# Patient Record
Sex: Male | Born: 1950
Health system: Southern US, Community
[De-identification: ages and names within clinical notes are randomized; demographics above are authoritative.]

## PROBLEM LIST (undated history)

## (undated) DIAGNOSIS — R519 Headache, unspecified: Secondary | ICD-10-CM

## (undated) DIAGNOSIS — B019 Varicella without complication: Secondary | ICD-10-CM

## (undated) DIAGNOSIS — I219 Acute myocardial infarction, unspecified: Secondary | ICD-10-CM

## (undated) DIAGNOSIS — I10 Essential (primary) hypertension: Secondary | ICD-10-CM

## (undated) DIAGNOSIS — R51 Headache: Secondary | ICD-10-CM

## (undated) DIAGNOSIS — R001 Bradycardia, unspecified: Secondary | ICD-10-CM

## (undated) DIAGNOSIS — I2510 Atherosclerotic heart disease of native coronary artery without angina pectoris: Secondary | ICD-10-CM

## (undated) DIAGNOSIS — E785 Hyperlipidemia, unspecified: Secondary | ICD-10-CM

## (undated) DIAGNOSIS — Z720 Tobacco use: Secondary | ICD-10-CM

## (undated) DIAGNOSIS — K219 Gastro-esophageal reflux disease without esophagitis: Secondary | ICD-10-CM

## (undated) DIAGNOSIS — M1990 Unspecified osteoarthritis, unspecified site: Secondary | ICD-10-CM

## (undated) DIAGNOSIS — E119 Type 2 diabetes mellitus without complications: Secondary | ICD-10-CM

## (undated) HISTORY — DX: Unspecified osteoarthritis, unspecified site: M19.90

## (undated) HISTORY — PX: POLYPECTOMY: SHX149

## (undated) HISTORY — PX: COLONOSCOPY: SHX174

## (undated) HISTORY — DX: Gastro-esophageal reflux disease without esophagitis: K21.9

## (undated) HISTORY — DX: Type 2 diabetes mellitus without complications: E11.9

## (undated) HISTORY — DX: Headache, unspecified: R51.9

## (undated) HISTORY — PX: SKIN CANCER EXCISION: SHX779

## (undated) HISTORY — DX: Headache: R51

## (undated) HISTORY — DX: Bradycardia, unspecified: R00.1

## (undated) HISTORY — DX: Varicella without complication: B01.9

---

## 2009-02-28 ENCOUNTER — Ambulatory Visit: Payer: Self-pay | Admitting: Family Medicine

## 2009-02-28 DIAGNOSIS — J069 Acute upper respiratory infection, unspecified: Secondary | ICD-10-CM | POA: Insufficient documentation

## 2009-09-11 ENCOUNTER — Ambulatory Visit: Payer: Self-pay | Admitting: Family Medicine

## 2009-09-11 DIAGNOSIS — R030 Elevated blood-pressure reading, without diagnosis of hypertension: Secondary | ICD-10-CM

## 2009-09-11 DIAGNOSIS — J029 Acute pharyngitis, unspecified: Secondary | ICD-10-CM

## 2009-09-24 ENCOUNTER — Ambulatory Visit: Payer: Self-pay | Admitting: Family Medicine

## 2010-03-17 NOTE — Assessment & Plan Note (Signed)
Summary: NEW PT/RCD   Vital Signs:  Patient profile:   60 year old male Height:      64.50 inches Weight:      156 pounds BMI:     26.46 Temp:     98.7 degrees F oral Pulse rate:   80 / minute Pulse rhythm:   regular Resp:     12 per minute BP sitting:   132 / 84  (left arm) Cuff size:   regular  Vitals Entered By: Sid Falcon LPN (February 28, 2009 3:39 PM) CC: New pt to establisn, sinus problems X 2-3 days   History of Present Illness: New patient to establish care. Seen with acute febrile illness which started few days ago. Fever up to 101 2 days ago but tapering down since then. Was seen at another practice and started on Zithromax for reported acute sinusitis. Reportedly had  negative flu screen. Symptoms include chills, dry cough, mild headaches, nasal congestion. No significant sore throat. No rashes.  Patient allergic to penicillin. Compliant with antibiotic therapy.  using hydrocodone cough medication at night for cough suppression.  Preventive Screening-Counseling & Management  Alcohol-Tobacco     Smoking Status: quit > 6 months     Year Quit: 1965  Allergies (verified): 1)  ! Penicillin V Potassium (Penicillin V Potassium) 2)  ! Prednisone (Prednisone)  Past History:  Family History: Last updated: 02/28/2009 Family History of Colon CA, father, brother Lung cancer, brother & sister Heart disease, brother Stroke, mother age 69  Social History: Last updated: 02/28/2009 Retired Married Alcohol use-no Past smoker  Risk Factors: Smoking Status: quit > 6 months (02/28/2009)  Family History: Family History of Colon CA, father, brother Lung cancer, brother & sister Heart disease, brother Stroke, mother age 14  Social History: Retired Married Alcohol use-no Past smoker Smoking Status:  quit > 6 months  Review of Systems      See HPI  Physical Exam  General:  Well-developed,well-nourished,in no acute distress; alert,appropriate and cooperative  throughout examination Ears:  External ear exam shows no significant lesions or deformities.  Otoscopic examination reveals clear canals, tympanic membranes are intact bilaterally without bulging, retraction, inflammation or discharge. Hearing is grossly normal bilaterally. Nose:  minimal clear rhinorrhea Mouth:  Oral mucosa and oropharynx without lesions or exudates.  Teeth in good repair. Neck:  No deformities, masses, or tenderness noted. Lungs:  Normal respiratory effort, chest expands symmetrically. Lungs are clear to auscultation, no crackles or wheezes. Heart:  Normal rate and regular rhythm. S1 and S2 normal without gallop, murmur, click, rub or other extra sounds.   Impression & Recommendations:  Problem # 1:  VIRAL URI (ICD-465.9) Finish out Z-max, though suspect this is all viral.  Cont cough med as needed .  Patient Instructions: 1)  Get plenty of rest, drink lots of clear liquids, and use Tylenol or Ibuprofen for fever and comfort. Return in 7-10 days if you're not better: sooner if you'er feeling worse.  2)  May take hydrocodone cough syrup 1 teaspoon every 4-6 hours as needed.

## 2010-03-17 NOTE — Assessment & Plan Note (Signed)
Summary: TROUBLE SWOLLING, CAN'T TALK//SLM   Vital Signs:  Patient profile:   60 year old male Weight:      158 pounds Temp:     100.8 degrees F oral BP sitting:   170 / 110  (left arm) Cuff size:   regular  Vitals Entered By: Sid Falcon LPN (September 11, 2009 8:33 AM)  Serial Vital Signs/Assessments:  Time      Position  BP       Pulse  Resp  Temp     By                     150/80                         Evelena Peat MD  CC: Walk-in, trouble swallowing, sore throat   History of Present Illness: Pt seen with 2 day hx of severe sore throat and fever. Gargoyles and Advil without much improvement.   Some yellowish postnasal drainage.  Mild HA. Is taking some ice chips.  No cough or dyspnea complaints.  Long hx of oral tobacco use.  No dysphagia prior to onset of sore throat.  Allergies: 1)  ! Penicillin V Potassium (Penicillin V Potassium) 2)  ! Prednisone (Prednisone)  Past History:  Family History: Last updated: 02/28/2009 Family History of Colon CA, father, brother Lung cancer, brother & sister Heart disease, brother Stroke, mother age 20  Social History: Last updated: 02/28/2009 Retired Married Alcohol use-no Past smoker  Risk Factors: Smoking Status: quit > 6 months (02/28/2009)  Review of Systems       The patient complains of fever.  The patient denies weight loss, chest pain, syncope, dyspnea on exertion, peripheral edema, prolonged cough, headaches, and abdominal pain.    Physical Exam  General:  Well-developed,well-nourished,in no acute distress; alert,appropriate and cooperative throughout examination Head:  Normocephalic and atraumatic without obvious abnormalities. No apparent alopecia or balding. Ears:  External ear exam shows no significant lesions or deformities.  Otoscopic examination reveals clear canals, tympanic membranes are intact bilaterally without bulging, retraction, inflammation or discharge. Hearing is grossly normal  bilaterally. Nose:  External nasal examination shows no deformity or inflammation. Nasal mucosa are pink and moist without lesions or exudates. Mouth:  patient has ulcer soft palate distally and which is about 1/2-1 cm. Diffuse posterior pharynx erythema without exudate. No evidence for peritonsillar abscess.  No evidence for any signif edema post pharynx. Neck:  No deformities, masses, or tenderness noted. Lungs:  Normal respiratory effort, chest expands symmetrically. Lungs are clear to auscultation, no crackles or wheezes. Heart:  Normal rate and regular rhythm. S1 and S2 normal without gallop, murmur, click, rub or other extra sounds. Skin:  no rash   Impression & Recommendations:  Problem # 1:  SORE THROAT (ICD-462)  rapid strep negative.   His updated medication list for this problem includes:    Azithromycin 250 Mg Tabs (Azithromycin) .Marland Kitchen... 2 by mouth today then one by mouth once daily for 4 days  Problem # 2:  ELEVATED BLOOD PRESSURE (ICD-796.2) Assessment: New follow up 2 weeks to reassess.  Complete Medication List: 1)  Azithromycin 250 Mg Tabs (Azithromycin) .... 2 by mouth today then one by mouth once daily for 4 days 2)  Hydrocodone-acetaminophen 5-325 Mg Tabs (Hydrocodone-acetaminophen) .Marland Kitchen.. 1-2 by mouth q 4-6 hours as needed severe pain  Patient Instructions: 1)  Followup promptly if you were unable to swallow fluids or sore  throat not improving over the next couple of days. Continue Aleve as needed for discomfort 2)  Please schedule a follow-up appointment in 2 weeks.  Prescriptions: HYDROCODONE-ACETAMINOPHEN 5-325 MG TABS (HYDROCODONE-ACETAMINOPHEN) 1-2 by mouth q 4-6 hours as needed severe pain  #30 x 0   Entered and Authorized by:   Evelena Peat MD   Signed by:   Evelena Peat MD on 09/11/2009   Method used:   Print then Give to Patient   RxID:   [id number] AZITHROMYCIN 250 MG TABS (AZITHROMYCIN) 2 by mouth today then one by mouth once daily for 4 days   #6 x 0   Entered and Authorized by:   Evelena Peat MD   Signed by:   Evelena Peat MD on 09/11/2009   Method used:   Print then Give to Patient   RxID:   [id number]

## 2010-03-17 NOTE — Assessment & Plan Note (Signed)
Summary: 2 WK ROV//SLM/PTS WIFE RSC/CJR   Vital Signs:  Patient profile:   60 year old male Weight:      154 pounds Temp:     98.4 degrees F oral Pulse rate:   80 / minute BP sitting:   120 / 80  (right arm) Cuff size:   regular  Vitals Entered By: Romualdo Bolk, CMA (AAMA) (September 24, 2009 1:26 PM) CC: Follow-up visit on bp   History of Present Illness: Patient recently seen for severe respiratory illness with sore throat. He had elevated blood pressure and we recommend follow up today to reassess. Never treated for hypertension. Wife has noted that when he takes Advil his blood pressure seems to go up. His sore throat has resolved. No further fever or chills.  Denies any headaches, dizziness, or chest pains. No alcohol use.  Preventive Screening-Counseling & Management  Alcohol-Tobacco     Alcohol drinks/day: 0     Smoking Status: quit > 6 months     Year Quit: 1965  Caffeine-Diet-Exercise     Caffeine use/day: 2     Does Patient Exercise: no  Current Medications (verified): 1)  Hydrocodone-Acetaminophen 5-325 Mg Tabs (Hydrocodone-Acetaminophen) .Marland Kitchen.. 1-2 By Mouth Q 4-6 Hours As Needed Severe Pain  Allergies (verified): 1)  ! Penicillin V Potassium (Penicillin V Potassium) 2)  ! Prednisone (Prednisone) PMH reviewed for relevance  Social History: Caffeine use/day:  2 Does Patient Exercise:  no  Physical Exam  General:  Well-developed,well-nourished,in no acute distress; alert,appropriate and cooperative throughout examination Mouth:  oropharynx now clear. No erythema or lesions Neck:  No deformities, masses, or tenderness noted. Lungs:  Normal respiratory effort, chest expands symmetrically. Lungs are clear to auscultation, no crackles or wheezes. Heart:  normal rate and regular rhythm.   Extremities:  no edema   Impression & Recommendations:  Problem # 1:  ELEVATED BLOOD PRESSURE (ICD-796.2) Assessment Improved possibly exacerbated by Advil.  Improved  reading today. Observe.  Problem # 2:  SORE THROAT (ICD-462) Assessment: Improved  The following medications were removed from the medication list:    Azithromycin 250 Mg Tabs (Azithromycin) .Marland Kitchen... 2 by mouth today then one by mouth once daily for 4 days  Complete Medication List: 1)  Hydrocodone-acetaminophen 5-325 Mg Tabs (Hydrocodone-acetaminophen) .Marland Kitchen.. 1-2 by mouth q 4-6 hours as needed severe pain  Patient Instructions: 1)  Check your  Blood Pressure regularly . If it is above:140/90   you should make an appointment.

## 2014-05-24 ENCOUNTER — Other Ambulatory Visit: Payer: Self-pay | Admitting: Family Medicine

## 2014-05-24 ENCOUNTER — Ambulatory Visit (INDEPENDENT_AMBULATORY_CARE_PROVIDER_SITE_OTHER): Payer: Medicare Other | Admitting: Family Medicine

## 2014-05-24 ENCOUNTER — Encounter: Payer: Self-pay | Admitting: Family Medicine

## 2014-05-24 VITALS — BP 126/90 | HR 70 | Temp 98.5°F | Ht 64.96 in | Wt 163.0 lb

## 2014-05-24 DIAGNOSIS — R300 Dysuria: Secondary | ICD-10-CM

## 2014-05-24 DIAGNOSIS — Z125 Encounter for screening for malignant neoplasm of prostate: Secondary | ICD-10-CM | POA: Diagnosis not present

## 2014-05-24 DIAGNOSIS — Z800 Family history of malignant neoplasm of digestive organs: Secondary | ICD-10-CM | POA: Diagnosis not present

## 2014-05-24 DIAGNOSIS — M545 Low back pain, unspecified: Secondary | ICD-10-CM

## 2014-05-24 DIAGNOSIS — Z23 Encounter for immunization: Secondary | ICD-10-CM

## 2014-05-24 DIAGNOSIS — R7989 Other specified abnormal findings of blood chemistry: Secondary | ICD-10-CM

## 2014-05-24 DIAGNOSIS — Z0000 Encounter for general adult medical examination without abnormal findings: Secondary | ICD-10-CM

## 2014-05-24 DIAGNOSIS — R229 Localized swelling, mass and lump, unspecified: Secondary | ICD-10-CM | POA: Diagnosis not present

## 2014-05-24 LAB — LIPID PANEL
CHOL/HDL RATIO: 8
CHOLESTEROL: 205 mg/dL — AB (ref 0–200)
HDL: 26.2 mg/dL — ABNORMAL LOW (ref >39.00)
NonHDL: 178.8
Triglycerides: 259 mg/dL — ABNORMAL HIGH (ref 0.0–149.0)
VLDL: 51.8 mg/dL — ABNORMAL HIGH (ref 0.0–40.0)

## 2014-05-24 LAB — CBC WITH DIFFERENTIAL/PLATELET
BASOS PCT: 0.4 % (ref 0.0–3.0)
Basophils Absolute: 0 10*3/uL (ref 0.0–0.1)
EOS PCT: 0.9 % (ref 0.0–5.0)
Eosinophils Absolute: 0.1 10*3/uL (ref 0.0–0.7)
HCT: 45.7 % (ref 39.0–52.0)
HEMOGLOBIN: 15.6 g/dL (ref 13.0–17.0)
LYMPHS PCT: 28.7 % (ref 12.0–46.0)
Lymphs Abs: 2.5 10*3/uL (ref 0.7–4.0)
MCHC: 34 g/dL (ref 30.0–36.0)
MCV: 90.4 fl (ref 78.0–100.0)
MONO ABS: 0.8 10*3/uL (ref 0.1–1.0)
MONOS PCT: 9 % (ref 3.0–12.0)
NEUTROS ABS: 5.3 10*3/uL (ref 1.4–7.7)
NEUTROS PCT: 61 % (ref 43.0–77.0)
Platelets: 281 10*3/uL (ref 150.0–400.0)
RBC: 5.06 Mil/uL (ref 4.22–5.81)
RDW: 13.6 % (ref 11.5–15.5)
WBC: 8.8 10*3/uL (ref 4.0–10.5)

## 2014-05-24 LAB — POCT URINALYSIS DIPSTICK
BILIRUBIN UA: NEGATIVE
Glucose, UA: NEGATIVE
Ketones, UA: NEGATIVE
Nitrite, UA: NEGATIVE
Spec Grav, UA: 1.015
UROBILINOGEN UA: 0.2
pH, UA: 5.5

## 2014-05-24 LAB — BASIC METABOLIC PANEL
BUN: 13 mg/dL (ref 6–23)
CO2: 26 mEq/L (ref 19–32)
CREATININE: 0.9 mg/dL (ref 0.40–1.50)
Calcium: 9.7 mg/dL (ref 8.4–10.5)
Chloride: 105 mEq/L (ref 96–112)
GFR: 89.69 mL/min (ref >60.00)
Glucose, Bld: 105 mg/dL — ABNORMAL HIGH (ref 70–99)
Potassium: 4.7 mEq/L (ref 3.5–5.1)
Sodium: 138 mEq/L (ref 135–145)

## 2014-05-24 LAB — HEPATIC FUNCTION PANEL
ALBUMIN: 4.4 g/dL (ref 3.5–5.2)
ALT: 16 U/L (ref 0–53)
AST: 17 U/L (ref 0–37)
Alkaline Phosphatase: 38 U/L — ABNORMAL LOW (ref 39–117)
BILIRUBIN TOTAL: 0.5 mg/dL (ref 0.2–1.2)
Bilirubin, Direct: 0.1 mg/dL (ref 0.0–0.3)
TOTAL PROTEIN: 7.9 g/dL (ref 6.0–8.3)

## 2014-05-24 LAB — LDL CHOLESTEROL, DIRECT: Direct LDL: 129 mg/dL

## 2014-05-24 LAB — TSH: TSH: 3.97 u[IU]/mL (ref 0.35–4.50)

## 2014-05-24 LAB — PSA: PSA: 2.56 ng/mL (ref 0.10–4.00)

## 2014-05-24 MED ORDER — TRAMADOL HCL 50 MG PO TABS: 50.0000 mg | ORAL_TABLET | Freq: Four times a day (QID) | ORAL | Status: DC | PRN

## 2014-05-24 NOTE — Progress Notes (Signed)
Pre visit review using our clinic review tool, if applicable. No additional management support is needed unless otherwise documented below in the visit note. 

## 2014-05-24 NOTE — Patient Instructions (Signed)
We will call you regarding screening colonoscopy We will call you with all labs were done today Schedule follow-up for right forearm skin lesion excision Try to reduce oral tobacco use to reduce risk of oral cancers Consider yearly flu vaccine and Pneumovax in 1 year

## 2014-05-24 NOTE — Progress Notes (Signed)
Subjective:    Patient ID: Terry Rivers, male    DOB: 1950-06-26, 64 y.o.   MRN: [id number]  HPI  Patient seen for Medicare Wellness visit (his first)- and for other separate issues below. He has not been seen here in several years nor by any other primary care physician. He does not take any regular medications. He does not smoke but does use oral tobacco. He had tetanus 2005. Has never had pneumonia vaccine. No history of colon cancer screening. Previous allergy to penicillin  Very strong family history of cancer. Father and brother had colon cancer. Sister died of uterine cancer. 2 brothers deceased with lung cancer. Father had prostate cancer. One brother died of renal cell cancer and another from bladder cancer. Mother died age 46 from stroke complications.  He does not take any medications. He is retired.  Reported history of positive PPD 1980. Previous chest x-rays normal. Denies any cough or shortness of breath.  Right arm nodular lesion which is nontender and asymptomatic. Present for several years. Possibly slowly enlarging  Occasional mild burning with urination.  No fever.   No obstructive symptoms.  No alleviating factors.  Low lumbar back pain for past few weeks.  Slightly worse right side.  No radiculopathy symptoms.  No LE weakness or numbness.  Worse with back flexion.  Mild to moderate pain.  Not relieved with OTC Tylenol.    Past Medical History  Diagnosis Date  . Arthritis   . Chicken pox   . Frequent headaches    History reviewed. No pertinent past surgical history.  reports that he has never smoked. His smokeless tobacco use includes Chew. He reports that he does not drink alcohol or use illicit drugs. family history includes Cancer in his brother, father, and mother; Stroke in his mother. Allergies  Allergen Reactions  . Penicillins     REACTION: joint swelling, breathing issues  . Prednisone     REACTION: joint swelling, could not walk   1.  Risk factors  based on Past Medical , Social, and Family history reviewed and as indicated above with no changes 2.  Limitations in physical activities None.  No recent falls. 3.  Depression/mood No active depression or anxiety issues 4.  Hearing No defiits 5.  ADLs independent in all. 6.  Cognitive function (orientation to time and place, language, writing, speech,memory) no short or long term memory issues.  Language and judgement intact. 7.  Home Safety no issues 8.  Height, weight, and visual acuity.all stable. 9.  Counseling discussed importance of cessation or oral tobacco. 10. Recommendation of preventive services. prevnar 13 and tetanus.  Yearly flu recommended and Pneumovax in one year. 11. Labs based on risk factors-lipid, PSA, CMP, CBC 12. Care Plan-as above. 13. Other Providers-none 14. Written schedule of screening/prevention services given to patient.    Review of Systems  Constitutional: Negative for fever, activity change, appetite change and fatigue.  HENT: Negative for congestion, ear pain and trouble swallowing.   Eyes: Negative for pain and visual disturbance.  Respiratory: Negative for cough, shortness of breath and wheezing.   Cardiovascular: Negative for chest pain and palpitations.  Gastrointestinal: Negative for nausea, vomiting, abdominal pain, diarrhea, constipation, blood in stool, abdominal distention and rectal pain.  Genitourinary: Positive for dysuria. Negative for hematuria and testicular pain.       Occasional mild burning with urination.  No fever.    Musculoskeletal: Positive for back pain. Negative for joint swelling and arthralgias.  Skin:  Negative for rash.  Neurological: Negative for dizziness, syncope and headaches.  Hematological: Negative for adenopathy.  Psychiatric/Behavioral: Negative for confusion and dysphoric mood.       Objective:   Physical Exam  Constitutional: He is oriented to person, place, and time. He appears well-developed and  well-nourished. No distress.  HENT:  Head: Normocephalic and atraumatic.  Right Ear: External ear normal.  Left Ear: External ear normal.  Edentulous. No lesions. Tongue is brown in color from oral tobacco use  Eyes: Conjunctivae and EOM are normal. Pupils are equal, round, and reactive to light.  Neck: Normal range of motion. Neck supple. No thyromegaly present.  Cardiovascular: Normal rate, regular rhythm and normal heart sounds.   No murmur heard. Pulmonary/Chest: No respiratory distress. He has no wheezes. He has no rales.  Abdominal: Soft. Bowel sounds are normal. He exhibits no distension and no mass. There is no tenderness. There is no rebound and no guarding.  Genitourinary: Rectum normal and prostate normal.  Musculoskeletal: He exhibits no edema.  SLRs are negative.  Lymphadenopathy:    He has no cervical adenopathy.  Neurological: He is alert and oriented to person, place, and time. He displays normal reflexes. No cranial nerve deficit.  Full strength lower extremities.  Skin: No rash noted.  Patient has a couple of thick scaly lesions left forearm. Right forearm nodular thick hyperkeratotic lesion. Nontender to palpation  Psychiatric: He has a normal mood and affect.          Assessment & Plan:  Physical exam/wellness visit. Several health maintenance issues addressed. Tetanus booster given. Prevnar 13 given and would give PVX in one year. Obtain screening labs. Set up screening colonoscopy which he has never had. Encouraged to discontinue oral tobacco use.  Encouraged cessation or oral tobacco.  Nodular lesion right forearm. Need to rule out SCC.  Schedule follow-up to biopsy  Mild and intermittent dysuria.  Urine dip positive for blood- send urine micro.  No signs of infection.    Low lumbar back pain. Non-focal exam and suspect muscular.  Tramadol 1-2 every 6 hours prn severe pain. Consider PT if no better in 2-3 weeks.

## 2014-05-25 LAB — URINALYSIS, MICROSCOPIC ONLY
BACTERIA UA: NONE SEEN
CASTS: NONE SEEN
CRYSTALS: NONE SEEN
Squamous Epithelial / LPF: NONE SEEN

## 2014-05-27 ENCOUNTER — Telehealth: Payer: Self-pay | Admitting: Family Medicine

## 2014-05-27 NOTE — Telephone Encounter (Signed)
Was the rest of patient labs ok?

## 2014-05-27 NOTE — Telephone Encounter (Signed)
Slightly high lipids, otherwise labs OK.  Low saturated fat diet.

## 2014-05-27 NOTE — Telephone Encounter (Signed)
Pt would like blood work results. Pt can view on mychart does not understand the results

## 2014-05-28 NOTE — Telephone Encounter (Signed)
Left message for patient to return call.

## 2014-05-28 NOTE — Telephone Encounter (Signed)
Pt at home now, please cb

## 2014-05-28 NOTE — Telephone Encounter (Signed)
Spoke to pt's wife Suanne Marker, went over lab results with her and that labs were okay except slightly high cholesterol and Triglycerides, recommend a low fat saturated diet. Rhonda verbalized understanding and stated she would like Dr. Elease Hashimoto to call her to discuss plan. Told her I will give Dr. Elease Hashimoto the message. Rhonda verbalized understanding.

## 2014-05-28 NOTE — Telephone Encounter (Signed)
Dr. Elease Hashimoto, pt and wife would like you to call them about lab results.

## 2014-06-04 NOTE — Telephone Encounter (Signed)
I HAVE DISCUSSED ALL LABS RESULTS WITH HIS WIFE AND ANSWERED ALL HER QUESTIONS.

## 2014-06-04 NOTE — Telephone Encounter (Signed)
Ieft message last week and never heard back..  Can we see if she has specific questions?

## 2014-06-04 NOTE — Telephone Encounter (Signed)
Pt wife sent a Pt advice request in regards to the patient. I also informed patient wife with the results.

## 2014-06-24 ENCOUNTER — Encounter: Payer: Self-pay | Admitting: Gastroenterology

## 2014-06-24 ENCOUNTER — Encounter: Payer: Self-pay | Admitting: Family Medicine

## 2014-06-26 ENCOUNTER — Encounter: Payer: Self-pay | Admitting: Family Medicine

## 2014-06-26 ENCOUNTER — Ambulatory Visit (INDEPENDENT_AMBULATORY_CARE_PROVIDER_SITE_OTHER): Payer: Medicare Other | Admitting: Family Medicine

## 2014-06-26 VITALS — BP 130/86 | HR 56 | Temp 98.1°F | Wt 161.0 lb

## 2014-06-26 DIAGNOSIS — L570 Actinic keratosis: Secondary | ICD-10-CM

## 2014-06-26 DIAGNOSIS — L989 Disorder of the skin and subcutaneous tissue, unspecified: Secondary | ICD-10-CM

## 2014-06-26 NOTE — Progress Notes (Signed)
   Subjective:    Patient ID: Terry Rivers, male    DOB: [birth year], 64 y.o.   MRN: [id number]  HPI Patient here for procedure visit. He had recent physical. We noted skin lesions including right and left forearms. He's had several years of lots of sun exposure. Does lots of gardening. No prior history of skin cancer. Right forearm lesion noted about 7 or 8 months ago.  Past Medical History  Diagnosis Date  . Arthritis   . Chicken pox   . Frequent headaches    No past surgical history on file.  reports that he has never smoked. His smokeless tobacco use includes Chew. He reports that he does not drink alcohol or use illicit drugs. family history includes Cancer in his brother and father; Stroke in his mother. Allergies  Allergen Reactions  . Penicillins     REACTION: joint swelling, breathing issues  . Prednisone     REACTION: joint swelling, could not walk      Review of Systems  Constitutional: Negative for appetite change and unexpected weight change.       Objective:   Physical Exam  Constitutional: He appears well-developed and well-nourished. No distress.  Cardiovascular: Normal rate and regular rhythm.   Skin:  Left forearm reveals approximately 7-8 mm hyperkeratotic nonulcerative area mid aspect left forearm Right forearm dorsally hyperkeratotic 1 cm skin lesion. Slightly nodular base. No ulceration.          Assessment & Plan:  Skin lesions as above. Suspect actinic keratosis left forearm. Hyperkeratotic lesion right forearm. Rule out squamous cell carcinoma. We discussed risk and benefits of liquid nitrogen therapy to left forearm lesion patient consented. Treated without difficulty For right forearm lesion, Discussed risks and benefits of shave excision including risks of bleeding, bruising, scar formation, and infection and patient consented to proceed.  Skin prepped with betadine and alcohol. Anesthesia with 1% Xylocaine with epinephrine. shave excision of  lesion with #15 blade with minimal bleeding.  Patient tolerated well.  Antibiotic and dressing  applied.

## 2014-06-26 NOTE — Progress Notes (Signed)
Pre visit review using our clinic review tool, if applicable. No additional management support is needed unless otherwise documented below in the visit note. 

## 2014-06-26 NOTE — Patient Instructions (Signed)
Keep wound dry for the first 24 hours then clean daily with soap and water for one week. Apply topical antibiotic daily for 3-4 days. Keep covered with clean dressing for 4-5 days. Follow up promptly for any signs of infection such as redness, warmth, pain, or drainage.  

## 2014-08-27 ENCOUNTER — Ambulatory Visit (AMBULATORY_SURGERY_CENTER): Payer: Self-pay

## 2014-08-27 VITALS — Ht 65.0 in | Wt 163.4 lb

## 2014-08-27 DIAGNOSIS — Z800 Family history of malignant neoplasm of digestive organs: Secondary | ICD-10-CM

## 2014-08-27 MED ORDER — NA SULFATE-K SULFATE-MG SULF 17.5-3.13-1.6 GM/177ML PO SOLN: ORAL | Status: DC

## 2014-08-27 NOTE — Progress Notes (Signed)
Per pt, no allergies to soy or egg products.Pt not taking any weight loss meds or using  O2 at home. 

## 2014-09-06 ENCOUNTER — Encounter: Payer: Self-pay | Admitting: Gastroenterology

## 2014-09-06 ENCOUNTER — Ambulatory Visit (AMBULATORY_SURGERY_CENTER): Payer: Medicare Other | Admitting: Gastroenterology

## 2014-09-06 VITALS — BP 154/84 | HR 48 | Temp 97.8°F | Resp 24 | Ht 65.0 in | Wt 163.0 lb

## 2014-09-06 DIAGNOSIS — E669 Obesity, unspecified: Secondary | ICD-10-CM | POA: Diagnosis not present

## 2014-09-06 DIAGNOSIS — D123 Benign neoplasm of transverse colon: Secondary | ICD-10-CM

## 2014-09-06 DIAGNOSIS — Z800 Family history of malignant neoplasm of digestive organs: Secondary | ICD-10-CM | POA: Diagnosis not present

## 2014-09-06 DIAGNOSIS — D124 Benign neoplasm of descending colon: Secondary | ICD-10-CM | POA: Diagnosis not present

## 2014-09-06 DIAGNOSIS — D122 Benign neoplasm of ascending colon: Secondary | ICD-10-CM | POA: Diagnosis not present

## 2014-09-06 DIAGNOSIS — Z1211 Encounter for screening for malignant neoplasm of colon: Secondary | ICD-10-CM

## 2014-09-06 DIAGNOSIS — D120 Benign neoplasm of cecum: Secondary | ICD-10-CM

## 2014-09-06 MED ORDER — SODIUM CHLORIDE 0.9 % IV SOLN: 500.0000 mL | INTRAVENOUS | Status: DC

## 2014-09-06 NOTE — Op Note (Signed)
[facility]  [address] [place], [id number]   COLONOSCOPY PROCEDURE REPORT  PATIENT: Terry Rivers, Terry Rivers  MR#: [id number] BIRTHDATE: [birth year] , 67  yrs. old GENDER: male ENDOSCOPIST: Ladene Artist, MD, [facility] REFERRED NL:[id number] Elease Hashimoto, M.D. PROCEDURE DATE:  09/06/2014 PROCEDURE:   Colonoscopy, screening and Colonoscopy with snare polypectomy First Screening Colonoscopy - Avg.  risk and is 50 yrs.  old or older Yes.  Prior Negative Screening - Now for repeat screening. N/A  History of Adenoma - Now for follow-up colonoscopy & has been > or = to 3 yrs.  N/A  Polyps removed today? Yes ASA CLASS:   Class II INDICATIONS:Screening for colonic neoplasia and FH Colon or Rectal Adenocarcinoma. MEDICATIONS: Monitored anesthesia care and Propofol 220 mg IV DESCRIPTION OF PROCEDURE:   After the risks benefits and alternatives of the procedure were thoroughly explained, informed consent was obtained.  The digital rectal exam revealed no abnormalities of the rectum.   The LB [id number]  endoscope was introduced through the anus and advanced to the cecum, which was identified by both the appendix and ileocecal valve. No adverse events experienced.   The quality of the prep was good.  (Suprep was used)  The instrument was then slowly withdrawn as the colon was fully examined. Estimated blood loss is zero unless otherwise noted in this procedure report.  COLON FINDINGS: Three sessile polyps measuring 6 mm in size were found in the descending colon, ascending colon, and at the cecum. Polypectomies were performed with a cold snare.  The resection was complete, the polyp tissue was completely retrieved and sent to histology.   A sessile polyp measuring 8 mm in size was found in the transverse colon.  A polypectomy was performed with a snare cautery.  The resection was complete, the polyp tissue was completely retrieved and sent to histology.   There was mild diverticulosis  noted in the sigmoid colon.   The examination was otherwise normal.  Retroflexed views revealed no abnormalities. The time to cecum = 2.1 Withdrawal time = 13.4   The scope was withdrawn and the procedure completed. COMPLICATIONS: There were no immediate complications.  ENDOSCOPIC IMPRESSION: 1.   Three sessile polyps in the descending, ascending, and at the cecum; polypectomies performed with a cold snare 2.   Sessile polyp in the transverse colon; polypectomy performed with snare cautery 3.   Mild diverticulosis in the sigmoid colon  RECOMMENDATIONS: 1.  Await pathology results 2.  High fiber diet with liberal fluid intake. 3.  Hold Aspirin and all other NSAIDS for 2 weeks. 4.  Repeat colonoscopy in 3 years if 3or 4 polyps adenomatous; otherwise 5 years  eSigned:  Ladene Artist, MD, [employer] [facility] [employer] [facility] 09/06/2014 8:28 AM

## 2014-09-06 NOTE — Progress Notes (Signed)
Report to PACU, RN, vss, BBS= Clear.  

## 2014-09-06 NOTE — Patient Instructions (Signed)
Discharge instructions given. Handouts on polyps and diverticulosis. Hold aspirin and all other NSAIDS for 2 weeks. Resume previous medications. YOU HAD AN ENDOSCOPIC PROCEDURE TODAY AT THE [facility]:   Refer to the procedure report that was given to you for any specific questions about what was found during the examination.  If the procedure report does not answer your questions, please call your gastroenterologist to clarify.  If you requested that your care partner not be given the details of your procedure findings, then the procedure report has been included in a sealed envelope for you to review at your convenience later.  YOU SHOULD EXPECT: Some feelings of bloating in the abdomen. Passage of more gas than usual.  Walking can help get rid of the air that was put into your GI tract during the procedure and reduce the bloating. If you had a lower endoscopy (such as a colonoscopy or flexible sigmoidoscopy) you may notice spotting of blood in your stool or on the toilet paper. If you underwent a bowel prep for your procedure, you may not have a normal bowel movement for a few days.  Please Note:  You might notice some irritation and congestion in your nose or some drainage.  This is from the oxygen used during your procedure.  There is no need for concern and it should clear up in a day or so.  SYMPTOMS TO REPORT IMMEDIATELY:   Following lower endoscopy (colonoscopy or flexible sigmoidoscopy):  Excessive amounts of blood in the stool  Significant tenderness or worsening of abdominal pains  Swelling of the abdomen that is new, acute  Fever of 100F or higher   For urgent or emergent issues, a gastroenterologist can be reached at any hour by calling [phone].   DIET: Your first meal following the procedure should be a small meal and then it is ok to progress to your normal diet. Heavy or fried foods are harder to digest and may make you feel nauseous or bloated.   Likewise, meals heavy in dairy and vegetables can increase bloating.  Drink plenty of fluids but you should avoid alcoholic beverages for 24 hours.  ACTIVITY:  You should plan to take it easy for the rest of today and you should NOT DRIVE or use heavy machinery until tomorrow (because of the sedation medicines used during the test).    FOLLOW UP: Our staff will call the number listed on your records the next business day following your procedure to check on you and address any questions or concerns that you may have regarding the information given to you following your procedure. If we do not reach you, we will leave a message.  However, if you are feeling well and you are not experiencing any problems, there is no need to return our call.  We will assume that you have returned to your regular daily activities without incident.  If any biopsies were taken you will be contacted by phone or by letter within the next 1-3 weeks.  Please call us at [phone] if you have not heard about the biopsies in 3 weeks.    SIGNATURES/CONFIDENTIALITY: You and/or your care partner have signed paperwork which will be entered into your electronic medical record.  These signatures attest to the fact that that the information above on your After Visit Summary has been reviewed and is understood.  Full responsibility of the confidentiality of this discharge information lies with you and/or your care-partner. 

## 2014-09-06 NOTE — Progress Notes (Signed)
Called to room to assist during endoscopic procedure.  Patient ID and intended procedure confirmed with present staff. Received instructions for my participation in the procedure from the performing physician.  

## 2014-09-09 ENCOUNTER — Telehealth: Payer: Self-pay

## 2014-09-09 NOTE — Telephone Encounter (Signed)
  Follow up Call-  Call back number 09/06/2014  Post procedure Call Back phone  # [phone]  Permission to leave phone message Yes     Patient questions:  Do you have a fever, pain , or abdominal swelling? No. Pain Score  0 *  Have you tolerated food without any problems? Yes.    Have you been able to return to your normal activities? Yes.    Do you have any questions about your discharge instructions: Diet   No. Medications  No. Follow up visit  No.  Do you have questions or concerns about your Care? No.  Actions: * If pain score is 4 or above: No action needed, pain <4.

## 2014-09-10 ENCOUNTER — Encounter: Payer: Self-pay | Admitting: Family Medicine

## 2014-09-10 ENCOUNTER — Encounter: Payer: No Typology Code available for payment source | Admitting: Gastroenterology

## 2014-09-10 ENCOUNTER — Ambulatory Visit (INDEPENDENT_AMBULATORY_CARE_PROVIDER_SITE_OTHER): Payer: Medicare Other | Admitting: Family Medicine

## 2014-09-10 ENCOUNTER — Encounter: Payer: Self-pay | Admitting: Gastroenterology

## 2014-09-10 VITALS — BP 132/80 | HR 60 | Temp 97.4°F | Wt 164.0 lb

## 2014-09-10 DIAGNOSIS — B079 Viral wart, unspecified: Secondary | ICD-10-CM | POA: Diagnosis not present

## 2014-09-10 DIAGNOSIS — L570 Actinic keratosis: Secondary | ICD-10-CM | POA: Diagnosis not present

## 2014-09-10 NOTE — Progress Notes (Signed)
Pre visit review using our clinic review tool, if applicable. No additional management support is needed unless otherwise documented below in the visit note. 

## 2014-09-10 NOTE — Progress Notes (Signed)
   Subjective:    Patient ID: Terry Rivers, male    DOB: 06-14-1950, 64 y.o.   MRN: [id number]  HPI Patient here have to skin lesions assessed. Right arm-recent shave excision with actinic keratosis. This has recurred. Also has warty, verrucous lesion just under his left on the cheek region. Not bleeding. Recently accidentally scratch this. No prior history of skin cancer. Several years of sun exposure  Past Medical History  Diagnosis Date  . Arthritis   . Chicken pox   . Frequent headaches   . Bradycardia     low 50"s per wife  . Diabetes mellitus without complication     Borderline/no meds   Past Surgical History  Procedure Laterality Date  . Skin cancer excision      right forearm/pre-cancerous/ 2 spots on left arm    reports that he has never smoked. His smokeless tobacco use includes Chew. He reports that he does not drink alcohol or use illicit drugs. family history includes Brain cancer in his sister; Cancer in his brother and father; Colon cancer in his brother and father; Kidney cancer in his brother, sister, sister, sister, and sister; Stroke in his mother. Allergies  Allergen Reactions  . Penicillins     REACTION: joint swelling, breathing issues  . Prednisone     REACTION: joint swelling, could not walk      Review of Systems  Constitutional: Negative for appetite change and unexpected weight change.       Objective:   Physical Exam  Constitutional: He appears well-developed and well-nourished. No distress.  Cardiovascular: Normal rate and regular rhythm.   Skin:  Thick hyperkeratotic well-demarcated 1 cm skin lesion right forearm. Left face about one and 1/2 cm below the left lower eyelid he has a elevated verrucous lesion. Well-demarcated borders          Assessment & Plan:  #1 actinic keratosis right forearm by recent biopsy. We recommended treatment with liquid nitrogen after discussing risks and benefits. If not resolving with liquid nitrogen will  recommend consideration for elliptical excision. Patient tolerated well #2 verrucous lesion left face. Suspect benign warty tissue. Discussed treatment liquid nitrogen patient consented. Patient tolerated well

## 2014-09-10 NOTE — Patient Instructions (Signed)
Let me know in one month if face and arm lesion not resolving. Keep clean with soap and water

## 2014-12-31 ENCOUNTER — Ambulatory Visit (INDEPENDENT_AMBULATORY_CARE_PROVIDER_SITE_OTHER): Payer: Medicare Other

## 2014-12-31 DIAGNOSIS — Z23 Encounter for immunization: Secondary | ICD-10-CM

## 2015-02-16 DIAGNOSIS — I2510 Atherosclerotic heart disease of native coronary artery without angina pectoris: Secondary | ICD-10-CM

## 2015-02-16 HISTORY — DX: Atherosclerotic heart disease of native coronary artery without angina pectoris: I25.10

## 2015-03-19 DIAGNOSIS — I219 Acute myocardial infarction, unspecified: Secondary | ICD-10-CM

## 2015-03-19 HISTORY — DX: Acute myocardial infarction, unspecified: I21.9

## 2015-04-15 ENCOUNTER — Inpatient Hospital Stay (HOSPITAL_COMMUNITY)
Admission: EM | Admit: 2015-04-15 | Discharge: 2015-04-17 | DRG: 247 | Disposition: A | Discharge status: Home / Self Care | Payer: Medicare Other | Attending: Cardiology | Admitting: Cardiology

## 2015-04-15 ENCOUNTER — Encounter (HOSPITAL_COMMUNITY)
Admission: EM | Disposition: A | Discharge status: Home / Self Care | Payer: Self-pay | Source: Home / Self Care | Attending: Cardiology

## 2015-04-15 DIAGNOSIS — I2119 ST elevation (STEMI) myocardial infarction involving other coronary artery of inferior wall: Principal | ICD-10-CM | POA: Diagnosis present

## 2015-04-15 DIAGNOSIS — I351 Nonrheumatic aortic (valve) insufficiency: Secondary | ICD-10-CM | POA: Diagnosis present

## 2015-04-15 DIAGNOSIS — I10 Essential (primary) hypertension: Secondary | ICD-10-CM | POA: Diagnosis present

## 2015-04-15 DIAGNOSIS — E119 Type 2 diabetes mellitus without complications: Secondary | ICD-10-CM | POA: Diagnosis not present

## 2015-04-15 DIAGNOSIS — F17220 Nicotine dependence, chewing tobacco, uncomplicated: Secondary | ICD-10-CM | POA: Diagnosis present

## 2015-04-15 DIAGNOSIS — Z823 Family history of stroke: Secondary | ICD-10-CM | POA: Diagnosis not present

## 2015-04-15 DIAGNOSIS — R001 Bradycardia, unspecified: Secondary | ICD-10-CM | POA: Diagnosis not present

## 2015-04-15 DIAGNOSIS — E785 Hyperlipidemia, unspecified: Secondary | ICD-10-CM | POA: Diagnosis present

## 2015-04-15 DIAGNOSIS — R112 Nausea with vomiting, unspecified: Secondary | ICD-10-CM | POA: Diagnosis not present

## 2015-04-15 DIAGNOSIS — I2510 Atherosclerotic heart disease of native coronary artery without angina pectoris: Secondary | ICD-10-CM | POA: Diagnosis not present

## 2015-04-15 DIAGNOSIS — M1990 Unspecified osteoarthritis, unspecified site: Secondary | ICD-10-CM | POA: Diagnosis present

## 2015-04-15 DIAGNOSIS — I499 Cardiac arrhythmia, unspecified: Secondary | ICD-10-CM | POA: Diagnosis not present

## 2015-04-15 DIAGNOSIS — Z7982 Long term (current) use of aspirin: Secondary | ICD-10-CM | POA: Diagnosis not present

## 2015-04-15 DIAGNOSIS — Z888 Allergy status to other drugs, medicaments and biological substances status: Secondary | ICD-10-CM | POA: Diagnosis not present

## 2015-04-15 DIAGNOSIS — R079 Chest pain, unspecified: Secondary | ICD-10-CM | POA: Diagnosis not present

## 2015-04-15 DIAGNOSIS — Z8249 Family history of ischemic heart disease and other diseases of the circulatory system: Secondary | ICD-10-CM

## 2015-04-15 DIAGNOSIS — I213 ST elevation (STEMI) myocardial infarction of unspecified site: Secondary | ICD-10-CM

## 2015-04-15 DIAGNOSIS — I2111 ST elevation (STEMI) myocardial infarction involving right coronary artery: Secondary | ICD-10-CM

## 2015-04-15 DIAGNOSIS — Z880 Allergy status to penicillin: Secondary | ICD-10-CM

## 2015-04-15 DIAGNOSIS — Z720 Tobacco use: Secondary | ICD-10-CM | POA: Diagnosis present

## 2015-04-15 DIAGNOSIS — R61 Generalized hyperhidrosis: Secondary | ICD-10-CM | POA: Diagnosis not present

## 2015-04-15 HISTORY — DX: Hyperlipidemia, unspecified: E78.5

## 2015-04-15 HISTORY — DX: Atherosclerotic heart disease of native coronary artery without angina pectoris: I25.10

## 2015-04-15 HISTORY — PX: CARDIAC CATHETERIZATION: SHX172

## 2015-04-15 HISTORY — DX: Tobacco use: Z72.0

## 2015-04-15 LAB — COMPREHENSIVE METABOLIC PANEL
ALBUMIN: 3.6 g/dL (ref 3.5–5.0)
ALBUMIN: 3.7 g/dL (ref 3.5–5.0)
ALK PHOS: 30 U/L — AB (ref 38–126)
ALK PHOS: 31 U/L — AB (ref 38–126)
ALT: 34 U/L (ref 17–63)
ALT: 35 U/L (ref 17–63)
ANION GAP: 9 (ref 5–15)
AST: 31 U/L (ref 15–41)
AST: 58 U/L — AB (ref 15–41)
Anion gap: 9 (ref 5–15)
BILIRUBIN TOTAL: 0.5 mg/dL (ref 0.3–1.2)
BUN: 11 mg/dL (ref 6–20)
BUN: 9 mg/dL (ref 6–20)
CALCIUM: 8.4 mg/dL — AB (ref 8.9–10.3)
CALCIUM: 8.6 mg/dL — AB (ref 8.9–10.3)
CHLORIDE: 111 mmol/L (ref 101–111)
CO2: 20 mmol/L — ABNORMAL LOW (ref 22–32)
CO2: 21 mmol/L — AB (ref 22–32)
CREATININE: 0.93 mg/dL (ref 0.61–1.24)
Chloride: 112 mmol/L — ABNORMAL HIGH (ref 101–111)
Creatinine, Ser: 0.87 mg/dL (ref 0.61–1.24)
GFR calc Af Amer: >60 mL/min (ref >60)
GFR calc non Af Amer: >60 mL/min (ref >60)
GFR calc non Af Amer: >60 mL/min (ref >60)
GLUCOSE: 139 mg/dL — AB (ref 65–99)
GLUCOSE: 117 mg/dL — AB (ref 65–99)
POTASSIUM: 3.2 mmol/L — AB (ref 3.5–5.1)
Potassium: 4.1 mmol/L (ref 3.5–5.1)
SODIUM: 141 mmol/L (ref 135–145)
SODIUM: 141 mmol/L (ref 135–145)
TOTAL PROTEIN: 6.5 g/dL (ref 6.5–8.1)
Total Bilirubin: 0.6 mg/dL (ref 0.3–1.2)
Total Protein: 6.7 g/dL (ref 6.5–8.1)

## 2015-04-15 LAB — DIFFERENTIAL
BASOS PCT: 0 %
Basophils Absolute: 0 10*3/uL (ref 0.0–0.1)
EOS PCT: 2 %
Eosinophils Absolute: 0.2 10*3/uL (ref 0.0–0.7)
Lymphocytes Relative: 34 %
Lymphs Abs: 3.1 10*3/uL (ref 0.7–4.0)
MONO ABS: 1.1 10*3/uL — AB (ref 0.1–1.0)
Monocytes Relative: 12 %
Neutro Abs: 4.7 10*3/uL (ref 1.7–7.7)
Neutrophils Relative %: 52 %

## 2015-04-15 LAB — POCT I-STAT, CHEM 8
BUN: 12 mg/dL (ref 6–20)
CHLORIDE: 107 mmol/L (ref 101–111)
Calcium, Ion: 1.13 mmol/L (ref 1.13–1.30)
Creatinine, Ser: 0.8 mg/dL (ref 0.61–1.24)
GLUCOSE: 139 mg/dL — AB (ref 65–99)
HEMATOCRIT: 38 % — AB (ref 39.0–52.0)
HEMOGLOBIN: 12.9 g/dL — AB (ref 13.0–17.0)
POTASSIUM: 3.3 mmol/L — AB (ref 3.5–5.1)
Sodium: 144 mmol/L (ref 135–145)
TCO2: 20 mmol/L (ref 0–100)

## 2015-04-15 LAB — LIPID PANEL
CHOL/HDL RATIO: 8.4 ratio
Cholesterol: 151 mg/dL (ref 0–200)
HDL: 18 mg/dL — ABNORMAL LOW (ref >40)
LDL Cholesterol: 73 mg/dL (ref 0–99)
TRIGLYCERIDES: 300 mg/dL — AB (ref <150)
VLDL: 60 mg/dL — AB (ref 0–40)

## 2015-04-15 LAB — CBC WITH DIFFERENTIAL/PLATELET
BASOS ABS: 0 10*3/uL (ref 0.0–0.1)
Basophils Relative: 0 %
EOS PCT: 0 %
Eosinophils Absolute: 0 10*3/uL (ref 0.0–0.7)
HEMATOCRIT: 36.8 % — AB (ref 39.0–52.0)
Hemoglobin: 12.6 g/dL — ABNORMAL LOW (ref 13.0–17.0)
LYMPHS ABS: 2.1 10*3/uL (ref 0.7–4.0)
LYMPHS PCT: 19 %
MCH: 30.1 pg (ref 26.0–34.0)
MCHC: 34.2 g/dL (ref 30.0–36.0)
MCV: 88 fL (ref 78.0–100.0)
MONO ABS: 0.7 10*3/uL (ref 0.1–1.0)
MONOS PCT: 6 %
NEUTROS ABS: 8.1 10*3/uL — AB (ref 1.7–7.7)
Neutrophils Relative %: 75 %
Platelets: 205 10*3/uL (ref 150–400)
RBC: 4.18 MIL/uL — ABNORMAL LOW (ref 4.22–5.81)
RDW: 13.3 % (ref 11.5–15.5)
WBC: 10.9 10*3/uL — ABNORMAL HIGH (ref 4.0–10.5)

## 2015-04-15 LAB — POCT ACTIVATED CLOTTING TIME
ACTIVATED CLOTTING TIME: 224 s
Activated Clotting Time: 337 seconds
Activated Clotting Time: 271 seconds

## 2015-04-15 LAB — CBC
HEMATOCRIT: 37.3 % — AB (ref 39.0–52.0)
Hemoglobin: 12.9 g/dL — ABNORMAL LOW (ref 13.0–17.0)
MCH: 30.6 pg (ref 26.0–34.0)
MCHC: 34.6 g/dL (ref 30.0–36.0)
MCV: 88.4 fL (ref 78.0–100.0)
PLATELETS: 217 10*3/uL (ref 150–400)
RBC: 4.22 MIL/uL (ref 4.22–5.81)
RDW: 13.2 % (ref 11.5–15.5)
WBC: 9.1 10*3/uL (ref 4.0–10.5)

## 2015-04-15 LAB — GLUCOSE, CAPILLARY
GLUCOSE-CAPILLARY: 117 mg/dL — AB (ref 65–99)
Glucose-Capillary: 121 mg/dL — ABNORMAL HIGH (ref 65–99)

## 2015-04-15 LAB — TSH: TSH: 3.419 u[IU]/mL (ref 0.350–4.500)

## 2015-04-15 LAB — PROTIME-INR
INR: 1.16 (ref 0.00–1.49)
PROTHROMBIN TIME: 15 s (ref 11.6–15.2)

## 2015-04-15 LAB — APTT: aPTT: 25 seconds (ref 24–37)

## 2015-04-15 LAB — TROPONIN I
TROPONIN I: 9.3 ng/mL — AB (ref <0.031)
Troponin I: <0.03 ng/mL (ref <0.031)

## 2015-04-15 LAB — MRSA PCR SCREENING: MRSA by PCR: NEGATIVE

## 2015-04-15 SURGERY — LEFT HEART CATH AND CORONARY ANGIOGRAPHY

## 2015-04-15 MED ORDER — IOHEXOL 350 MG/ML SOLN
INTRAVENOUS | Status: DC | PRN
  Administered 2015-04-15: 170 mL via INTRAVENOUS

## 2015-04-15 MED ORDER — ONDANSETRON HCL 4 MG/2ML IJ SOLN: 4.0000 mg | Freq: Four times a day (QID) | INTRAMUSCULAR | Status: DC | PRN

## 2015-04-15 MED ORDER — ATROPINE SULFATE 0.1 MG/ML IJ SOLN
INTRAMUSCULAR | Status: AC
  Filled 2015-04-15: qty 10

## 2015-04-15 MED ORDER — ATORVASTATIN CALCIUM 80 MG PO TABS
80.0000 mg | ORAL_TABLET | Freq: Every day | ORAL | Status: DC
  Administered 2015-04-16: 80 mg via ORAL
  Filled 2015-04-15: qty 1

## 2015-04-15 MED ORDER — TICAGRELOR 90 MG PO TABS
ORAL_TABLET | ORAL | Status: DC | PRN
  Administered 2015-04-15: 180 mg via ORAL

## 2015-04-15 MED ORDER — HEPARIN (PORCINE) IN NACL 2-0.9 UNIT/ML-% IJ SOLN
INTRAMUSCULAR | Status: DC | PRN
  Administered 2015-04-15: 1000 mL

## 2015-04-15 MED ORDER — TICAGRELOR 90 MG PO TABS
ORAL_TABLET | ORAL | Status: AC
  Filled 2015-04-15: qty 2

## 2015-04-15 MED ORDER — TICAGRELOR 90 MG PO TABS
90.0000 mg | ORAL_TABLET | Freq: Two times a day (BID) | ORAL | Status: DC
  Administered 2015-04-16 – 2015-04-17 (×3): 90 mg via ORAL
  Filled 2015-04-15 (×3): qty 1

## 2015-04-15 MED ORDER — NITROGLYCERIN 0.4 MG SL SUBL: 0.4000 mg | SUBLINGUAL_TABLET | SUBLINGUAL | Status: DC | PRN

## 2015-04-15 MED ORDER — HEPARIN SODIUM (PORCINE) 1000 UNIT/ML IJ SOLN
INTRAMUSCULAR | Status: DC | PRN
  Administered 2015-04-15: 3000 [IU] via INTRAVENOUS
  Administered 2015-04-15: 2000 [IU] via INTRAVENOUS
  Administered 2015-04-15: 5000 [IU] via INTRAVENOUS

## 2015-04-15 MED ORDER — SODIUM CHLORIDE 0.9% FLUSH: 3.0000 mL | INTRAVENOUS | Status: DC | PRN

## 2015-04-15 MED ORDER — LIDOCAINE HCL (PF) 1 % IJ SOLN
INTRAMUSCULAR | Status: DC | PRN
  Administered 2015-04-15: 3 mL

## 2015-04-15 MED ORDER — HEPARIN SODIUM (PORCINE) 5000 UNIT/ML IJ SOLN: 5000.0000 [IU] | Freq: Three times a day (TID) | INTRAMUSCULAR | Status: DC

## 2015-04-15 MED ORDER — SODIUM CHLORIDE 0.9 % IV SOLN
INTRAVENOUS | Status: DC | PRN
  Administered 2015-04-15: 100 mL/h via INTRAVENOUS

## 2015-04-15 MED ORDER — SODIUM CHLORIDE 0.9% FLUSH
3.0000 mL | Freq: Two times a day (BID) | INTRAVENOUS | Status: DC
  Administered 2015-04-15: 3 mL via INTRAVENOUS
  Administered 2015-04-16: 6 mL via INTRAVENOUS
  Administered 2015-04-17: 3 mL via INTRAVENOUS

## 2015-04-15 MED ORDER — HEPARIN SODIUM (PORCINE) 1000 UNIT/ML IJ SOLN
INTRAMUSCULAR | Status: AC
  Filled 2015-04-15: qty 1

## 2015-04-15 MED ORDER — HEPARIN (PORCINE) IN NACL 2-0.9 UNIT/ML-% IJ SOLN
INTRAMUSCULAR | Status: AC
  Filled 2015-04-15: qty 1000

## 2015-04-15 MED ORDER — HEPARIN SODIUM (PORCINE) 5000 UNIT/ML IJ SOLN
5000.0000 [IU] | Freq: Three times a day (TID) | INTRAMUSCULAR | Status: DC
  Administered 2015-04-16: 5000 [IU] via SUBCUTANEOUS
  Filled 2015-04-15 (×2): qty 1

## 2015-04-15 MED ORDER — ATROPINE SULFATE 0.1 MG/ML IJ SOLN
INTRAMUSCULAR | Status: DC | PRN
  Administered 2015-04-15: 0.5 mg via INTRAVENOUS

## 2015-04-15 MED ORDER — SODIUM CHLORIDE 0.9 % WEIGHT BASED INFUSION
3.0000 mL/kg/h | INTRAVENOUS | Status: DC
  Administered 2015-04-15: 3 mL/kg/h via INTRAVENOUS

## 2015-04-15 MED ORDER — PANTOPRAZOLE SODIUM 40 MG PO TBEC
40.0000 mg | DELAYED_RELEASE_TABLET | Freq: Every day | ORAL | Status: DC
  Administered 2015-04-15 – 2015-04-17 (×3): 40 mg via ORAL
  Filled 2015-04-15 (×3): qty 1

## 2015-04-15 MED ORDER — LIDOCAINE HCL (PF) 1 % IJ SOLN
INTRAMUSCULAR | Status: AC
  Filled 2015-04-15: qty 30

## 2015-04-15 MED ORDER — NITROGLYCERIN 1 MG/10 ML FOR IR/CATH LAB
INTRA_ARTERIAL | Status: AC
  Filled 2015-04-15: qty 10

## 2015-04-15 MED ORDER — ACETAMINOPHEN 325 MG PO TABS: 650.0000 mg | ORAL_TABLET | ORAL | Status: DC | PRN

## 2015-04-15 MED ORDER — VERAPAMIL HCL 2.5 MG/ML IV SOLN
INTRAVENOUS | Status: DC | PRN
  Administered 2015-04-15: 16:00:00 via INTRA_ARTERIAL

## 2015-04-15 MED ORDER — VERAPAMIL HCL 2.5 MG/ML IV SOLN
INTRAVENOUS | Status: AC
  Filled 2015-04-15: qty 2

## 2015-04-15 MED ORDER — SODIUM CHLORIDE 0.9 % IV SOLN: 250.0000 mL | INTRAVENOUS | Status: DC | PRN

## 2015-04-15 MED ORDER — ASPIRIN 81 MG PO CHEW
81.0000 mg | CHEWABLE_TABLET | Freq: Every day | ORAL | Status: DC
  Administered 2015-04-16 – 2015-04-17 (×2): 81 mg via ORAL
  Filled 2015-04-15 (×2): qty 1

## 2015-04-15 SURGICAL SUPPLY — 22 items
BALLN EMERGE MR 2.5X15 (BALLOONS) ×3
BALLN ~~LOC~~ EMERGE MR 4.0X20 (BALLOONS) ×3
BALLOON EMERGE MR 2.5X15 (BALLOONS) ×1 IMPLANT
BALLOON ~~LOC~~ EMERGE MR 4.0X20 (BALLOONS) ×1 IMPLANT
CATH INFINITI 5 FR JL3.5 (CATHETERS) ×3 IMPLANT
CATH INFINITI 5FR ANG PIGTAIL (CATHETERS) ×3 IMPLANT
CATH VISTA GUIDE 6FR JR4 (CATHETERS) ×3 IMPLANT
DEVICE RAD COMP TR BAND LRG (VASCULAR PRODUCTS) ×3 IMPLANT
ELECT DEFIB PAD ADLT CADENCE (PAD) ×3 IMPLANT
GLIDESHEATH SLEND SS 6F .021 (SHEATH) ×3 IMPLANT
GUIDE CATH RUNWAY 6FR AL 1 (CATHETERS) ×3 IMPLANT
GUIDELINER 6F (CATHETERS) ×3 IMPLANT
KIT ENCORE 26 ADVANTAGE (KITS) ×3 IMPLANT
KIT HEART LEFT (KITS) ×3 IMPLANT
PACK CARDIAC CATHETERIZATION (CUSTOM PROCEDURE TRAY) ×3 IMPLANT
STENT SYNERGY DES 3.5X32 (Permanent Stent) ×3 IMPLANT
TRANSDUCER W/STOPCOCK (MISCELLANEOUS) ×3 IMPLANT
TUBING CIL FLEX 10 FLL-RA (TUBING) ×3 IMPLANT
WIRE ASAHI PROWATER 180CM (WIRE) ×3 IMPLANT
WIRE HI TORQ WHISPER MS 190CM (WIRE) ×3 IMPLANT
WIRE PT2 MS 185 (WIRE) ×3 IMPLANT
WIRE SAFE-T 1.5MM-J .035X260CM (WIRE) ×3 IMPLANT

## 2015-04-15 NOTE — Progress Notes (Signed)
Family in to see; waiting on CCU bed assignment

## 2015-04-15 NOTE — Progress Notes (Signed)
Ate some of dinner plate.

## 2015-04-15 NOTE — H&P (Signed)
Patient ID: AQUILLA GODBEE MRN: [id number], DOB/AGE: 08/21/1950   Admit date: 04/15/2015   Primary Physician: [name], MD Primary Cardiologist: New  Pt. Profile:  Terry Rivers is a 65 y.o. male with a history of bradycardia, diet controlled DM and arthritis who presented to [facility] by EMS with inferror STEMI.  HPI: As above. The patient was in [place] until this afternoon when he had a sudden onset of chest pain with sob and dizziness while doing some yard work. The pain persisted and he went home where he had an episode of vomiting. Wife took to fire department where EKG showed inferior STEMI and brought for emergent cath. He was given morphine 4mg , asa 324mg  and 2 SL nitro with improvement of chest pain.   Mother had stroke at age 45 and diet of it. Brother had MI at age 72 and died of it. 2014/06/04: Cholesterol 205*; HDL 26.20*; Triglycerides 259.0*; VLDL 51.8* LDL 129. He chews tobacco. Never smoked.   Problem List  Past Medical History  Diagnosis Date  . Arthritis   . Chicken pox   . Frequent headaches   . Bradycardia     low 50"s per wife  . Diabetes mellitus without complication     Borderline/no meds    Past Surgical History  Procedure Laterality Date  . Skin cancer excision      right forearm/pre-cancerous/ 2 spots on left arm     Allergies  Allergies  Allergen Reactions  . Penicillins     REACTION: joint swelling, breathing issues  . Prednisone     REACTION: joint swelling, could not walk     Home Medications  ASA 81mg  daily      Family History  Family History  Problem Relation Age of Onset  . Stroke Mother   . Cancer Father     colon, prostate  . Colon cancer Father   . Cancer Brother     lung  . Colon cancer Brother   . Kidney cancer Brother   . Kidney cancer Sister   . Brain cancer Sister   . Kidney cancer Sister   . Kidney cancer Sister   . Kidney cancer Sister    Family Status  Relation Status Death Age  . Mother Deceased    . Father Deceased   . Brother Deceased   . Brother Alive   . Sister Deceased   . Sister Deceased   . Sister Deceased   . Sister Deceased   . Brother Alive   . Sister Alive       Social History  Social History   Social History  . Marital Status: Married    Spouse Name: N/A  . Number of Children: N/A  . Years of Education: N/A   Occupational History  . Not on file.   Social History Main Topics  . Smoking status: Never Smoker   . Smokeless tobacco: Current User    Types: Chew     Comment: chew every day  . Alcohol Use: No  . Drug Use: No  . Sexual Activity: Not on file   Other Topics Concern  . Not on file   Social History Narrative     Review of Systems General:  No chills, fever, night sweats or weight changes.  Cardiovascular:  No  edema, orthopnea, palpitations, paroxysmal nocturnal dyspnea. Dermatological: No rash, lesions/masses Respiratory: No cough, dyspnea Urologic: No hematuria, dysuria Abdominal:   Nodiarrhea, bright red blood per rectum, melena, or  hematemesis Neurologic:  No visual changes, wkns, changes in mental status. All other systems reviewed and are otherwise negative except as noted above.  Physical Exam  SpO2 98 %.  General: Pleasant, NAD Psych: Normal affect. Neuro: Alert and oriented X 3. Moves all extremities spontaneously. HEENT: Normal  Neck: Supple without bruits or JVD. Lungs:  Resp regular and unlabored, CTA. Heart: RRR no s3, s4, or murmurs. Abdomen: Soft, non-tender, non-distended, BS + x 4.  Extremities: No clubbing, cyanosis or edema. DP/PT/Radials 2+ and equal bilaterally.  Labs  No results for input(s): CKTOTAL, CKMB, TROPONINI in the last 72 hours. Lab Results  Component Value Date   WBC 8.8 05/24/2014   HGB 15.6 05/24/2014   HCT 45.7 05/24/2014   MCV 90.4 05/24/2014   PLT 281.0 05/24/2014   No results for input(s): NA, K, CL, CO2, BUN, CREATININE, CALCIUM, PROT, BILITOT, ALKPHOS, ALT, AST, GLUCOSE in the  last 168 hours.  Invalid input(s): LABALBU Lab Results  Component Value Date   CHOL 205* 05/24/2014   HDL 26.20* 05/24/2014   TRIG 259.0* 05/24/2014   No results found for: DDIMER   Radiology/Studies  No results found.  ECG  Sinus rhythm with inferior ST elevation  ASSESSMENT AND PLAN  1. Inferior STEMI - No prior cardiac issue. However has significant family hx of cardiac disease. EKG with inferior STEMI. Pending emergent cath.   2. DM - Diet controlled. Check A1c  3. HL - 05/24/2014: Cholesterol 205*; HDL 26.20*; Triglycerides 259.0*; VLDL 51.8*  - Repeat lipid panel. Start statin.    4. Hx of Bradycardia - Follow closely if started BB  Signed, Bhagat,Bhavinkumar, PA-C 04/15/2015, 4:02 PM Pager [phone] Patient seen and examined and history reviewed. Agree with above findings and plan. 66 yo WM previously in fairly good health developed severe SSCP today while digging in the garden. Wife took him to nearby fire department and EMS called. Ecg showed an acute inferior STEMI with ST elevation inferiorly. Patient is bradycardic but apparently he has a history of bradycardia. Will proceed with emergent cardiac cath and PCI. Avoid beta blocker with bradycardia.  Latisa Belay [name], [place] 04/15/2015 5:36 PM

## 2015-04-16 ENCOUNTER — Inpatient Hospital Stay (HOSPITAL_COMMUNITY): Payer: Medicare Other

## 2015-04-16 ENCOUNTER — Encounter (HOSPITAL_COMMUNITY): Payer: Self-pay | Admitting: *Deleted

## 2015-04-16 ENCOUNTER — Other Ambulatory Visit (HOSPITAL_COMMUNITY): Payer: Medicare Other

## 2015-04-16 DIAGNOSIS — R079 Chest pain, unspecified: Secondary | ICD-10-CM

## 2015-04-16 LAB — LIPID PANEL
CHOL/HDL RATIO: 7.8 ratio
CHOLESTEROL: 155 mg/dL (ref 0–200)
HDL: 20 mg/dL — ABNORMAL LOW (ref >40)
LDL Cholesterol: 85 mg/dL (ref 0–99)
Triglycerides: 248 mg/dL — ABNORMAL HIGH (ref <150)
VLDL: 50 mg/dL — AB (ref 0–40)

## 2015-04-16 LAB — GLUCOSE, CAPILLARY
GLUCOSE-CAPILLARY: 104 mg/dL — AB (ref 65–99)
GLUCOSE-CAPILLARY: 116 mg/dL — AB (ref 65–99)
Glucose-Capillary: 78 mg/dL (ref 65–99)
Glucose-Capillary: 139 mg/dL — ABNORMAL HIGH (ref 65–99)

## 2015-04-16 LAB — BASIC METABOLIC PANEL
ANION GAP: 10 (ref 5–15)
BUN: 7 mg/dL (ref 6–20)
CALCIUM: 8.7 mg/dL — AB (ref 8.9–10.3)
CO2: 20 mmol/L — AB (ref 22–32)
CREATININE: 0.87 mg/dL (ref 0.61–1.24)
Chloride: 111 mmol/L (ref 101–111)
GFR calc non Af Amer: >60 mL/min (ref >60)
Glucose, Bld: 115 mg/dL — ABNORMAL HIGH (ref 65–99)
Potassium: 3.9 mmol/L (ref 3.5–5.1)
SODIUM: 141 mmol/L (ref 135–145)

## 2015-04-16 LAB — CBC
HCT: 36.7 % — ABNORMAL LOW (ref 39.0–52.0)
Hemoglobin: 12.5 g/dL — ABNORMAL LOW (ref 13.0–17.0)
MCH: 30.2 pg (ref 26.0–34.0)
MCHC: 34.1 g/dL (ref 30.0–36.0)
MCV: 88.6 fL (ref 78.0–100.0)
PLATELETS: 188 10*3/uL (ref 150–400)
RBC: 4.14 MIL/uL — ABNORMAL LOW (ref 4.22–5.81)
RDW: 13.6 % (ref 11.5–15.5)
WBC: 10.5 10*3/uL (ref 4.0–10.5)

## 2015-04-16 LAB — TROPONIN I
TROPONIN I: 11.79 ng/mL — AB (ref <0.031)
Troponin I: 15.82 ng/mL (ref <0.031)

## 2015-04-16 MED ORDER — LISINOPRIL 10 MG PO TABS
10.0000 mg | ORAL_TABLET | Freq: Every day | ORAL | Status: DC
  Administered 2015-04-16 – 2015-04-17 (×2): 10 mg via ORAL
  Filled 2015-04-16 (×2): qty 1

## 2015-04-16 MED FILL — Nitroglycerin IV Soln 100 MCG/ML in D5W: INTRA_ARTERIAL | Qty: 10 | Status: AC

## 2015-04-16 NOTE — Progress Notes (Signed)
Echocardiogram 2D Echocardiogram has been performed.  Terry Rivers 04/16/2015, 3:42 PM

## 2015-04-16 NOTE — Progress Notes (Signed)
Order for transfer acknowleged, Rm [id number] availiable, pt informed and verbalized understanding. Report given to 3W RN. Belongings packed. Pt transferred to 3W via wheelchair with belongings. Pt's wife notified. E link notified. Will monitor.

## 2015-04-16 NOTE — Care Management Note (Signed)
Case Management Note  Patient Details  Name: JASHON OVEREND MRN: [id number] Date of Birth: [birth year]  Subjective/Objective:      Adm w mi              Action/Plan: lives w wife, pcp dr burchette   Expected Discharge Date:                  Expected Discharge Plan:  Home/Self Care  In-House Referral:     Discharge planning Services  CM Consult, Medication Assistance  Post Acute Care Choice:    Choice offered to:     DME Arranged:    DME Agency:     HH Arranged:    [place] Agency:     Status of Service:     Medicare Important Message Given:    Date Medicare IM Given:    Medicare IM give by:    Date Additional Medicare IM Given:    Additional Medicare Important Message give by:     If discussed at [place] of Stay Meetings, dates discussed:    Additional Comments: ur review done. Gave pt 30day free brilinta card.  Lacretia Leigh, RN 04/16/2015, 9:43 AM

## 2015-04-16 NOTE — Progress Notes (Signed)
CARDIAC REHAB PHASE I   PRE:  Rate/Rhythm: 65 SR  BP:  Supine:   Sitting: 120/68  Standing:    SaO2:   MODE:  Ambulation: 550 ft   POST:  Rate/Rhythm: 75 SR  BP:  Supine:   Sitting: 135/81  Standing:    SaO2:  1420-1406 Pt walked 550 ft with no CP. Tolerated well. Stressed importance of brilinta with stent. Has brilinta card. Reviewed MI restrictions, NTG use, trying to not chew tobacco, and heart healthy diet. Has chewed since age of 20. Gave smoking cessation handout. Not sure he will quit but might cut down. Encouraged pt to watch white foods and discussed some healthy food choices. Left information for wife to read. Did not refer to CRP 2 as pt returning for staged PCI. Will refer at that time.    Graylon Good, RN BSN  04/16/2015 3:01 PM

## 2015-04-16 NOTE — Progress Notes (Addendum)
Cardiologist: Rivers Rivers  Subjective:   Inferior ST elevation myocardial infarction, chest pain, shortness of breath, dizziness while doing yard work, episode of vomiting. 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 Brother had myocardial infarction at age 67 and died. Mother had stroke at age 26, died. Never smoked. LDL 129.  As a history of bradycardia.  Overall he is doing well, no specific complaints, no further chest pain, shortness of breath.  Objective:  Vital Signs in the last 24 hours: Temp:  [98.2 F (36.8 C)-98.7 F (37.1 C)] 98.2 F (36.8 C) (03/01 0800) Pulse Rate:  [0-90] 51 (03/01 0900) Resp:  [0-40] 15 (03/01 0900) BP: (127-186)/(73-125) 142/73 mmHg (03/01 0900) SpO2:  [0 %-100 %] 98 % (03/01 0900) Weight:  [169 lb 1.5 oz (76.7 kg)] 169 lb 1.5 oz (76.7 kg) (02/28 1947)  Intake/Output from previous day: 02/28 0701 - 03/01 0700 In: 1590 [P.O.:480; I.V.:1110] Out: 1600 [Urine:1600]   Physical Exam: General: Well developed, well nourished, in no acute distress. Head:  Normocephalic and atraumatic. Lungs: Clear to auscultation and percussion. Heart: Normal S1 and S2.  No murmur, rubs or gallops.  Abdomen: soft, non-tender, positive bowel sounds. Extremities: No clubbing or cyanosis. No edema. Cardiac catheterization site normal Neurologic: Alert and oriented x 3.    Lab Results:  Recent Labs  04/15/15 2000 04/16/15 0507  WBC 10.9* 10.5  HGB 12.6* 12.5*  PLT 205 188    Recent Labs  04/15/15 2000 04/16/15 0507  NA 141 141  K 4.1 3.9  CL 111 111  CO2 21* 20*  GLUCOSE 117* 115*  BUN 9 7  CREATININE 0.93 0.87    Recent Labs  04/16/15 0025 04/16/15 0507  TROPONINI 15.82* 11.79*   Hepatic Function Panel  Recent Labs  04/15/15 2000  PROT 6.7  ALBUMIN 3.7  AST 58*  ALT 35  ALKPHOS 31*  BILITOT 0.6    Recent Labs  04/16/15 0507  CHOL 155      Telemetry: No adverse arrhythmias, no VT. Sinus bradycardia noted at times in  the upper 30s, low 40s. Personally viewed.   EKG:  04/16/15-T wave inversion noted in 3, aVF, mild in 2. No ST segment elevation at this point. Personally viewed.  Cardiac Studies:  Cardiac catheterization 04/15/15 at 4 PM  Prox LAD to Mid LAD lesion, 70% stenosed.  1st Diag lesion, 75% stenosed.  Prox Cx to Mid Cx lesion, 80% stenosed.  Prox RCA lesion, 100% stenosed. Post intervention, there is a 0% residual stenosis.  1. 3 vessel obstructive CAD 2. Culprit lesion in a large RCA 3. Normal LV EDP 4. Successful stenting of the proximal to mid RCA with a DES.   Plan: DAPT for one year. Will assess LV function with an Echo. Statin therapy. Avoid beta blocker given history of bradycardia. I would recommend staged PCI of the LCx, diagonal, and possibly the LAD (consider FFR). Given the complexity of disease I would favor doing this at a later date after he has recovered from his acute infarct- Perhaps in a few weeks depending on clinical course  Dr. Peter Rivers  Meds: Scheduled Meds: . aspirin  81 mg Oral Daily  . atorvastatin  80 mg Oral q1800  . heparin  5,000 Units Subcutaneous 3 times per day  . pantoprazole  40 mg Oral Daily  . sodium chloride flush  3 mL Intravenous Q12H  . ticagrelor  90 mg Oral BID   Continuous Infusions:  PRN Meds:.sodium chloride, acetaminophen, nitroGLYCERIN,  ondansetron (ZOFRAN) IV, sodium chloride flush  Assessment/Plan:  Principal Problem:   STEMI (ST elevation myocardial infarction) ([place]) Active Problems:   Bradycardia   Diabetes mellitus without complication (HCC)   ST elevation (STEMI) myocardial infarction involving right coronary artery (HCC)  Inferior ST elevation myocardial infarction - 100% occluded RCA, stented, DES - Residual circumflex, LAD disease (severe three-vessel disease) - Await echocardiogram for left ventricular function - Plan is to take him back to the Cath Lab at a later date, perhaps several weeks to allow to heal from  this acute infarct. - Dual antiplatelet therapy for one year - High-dose atorvastatin 80 mg  - No beta blocker secondary to bradycardia.   Essential hypertension - will add lisinopril 10mg  QD  Bradycardia - As above, avoiding beta blockers - He has had this issue previously  Diabetes without complications - Listed on problem list - Glucose mildly elevated 115 - We will check hemoglobin A1c  Ok for transfer to floor. Possible discharge tomorrow.   Rivers Rivers, [place] 04/16/2015, 9:12 AM

## 2015-04-17 ENCOUNTER — Encounter (HOSPITAL_COMMUNITY): Payer: Self-pay | Admitting: Cardiology

## 2015-04-17 ENCOUNTER — Telehealth: Payer: Self-pay | Admitting: Cardiology

## 2015-04-17 DIAGNOSIS — E785 Hyperlipidemia, unspecified: Secondary | ICD-10-CM

## 2015-04-17 DIAGNOSIS — I10 Essential (primary) hypertension: Secondary | ICD-10-CM | POA: Diagnosis present

## 2015-04-17 DIAGNOSIS — E119 Type 2 diabetes mellitus without complications: Secondary | ICD-10-CM

## 2015-04-17 DIAGNOSIS — Z720 Tobacco use: Secondary | ICD-10-CM

## 2015-04-17 DIAGNOSIS — I2510 Atherosclerotic heart disease of native coronary artery without angina pectoris: Secondary | ICD-10-CM | POA: Diagnosis present

## 2015-04-17 HISTORY — DX: Hyperlipidemia, unspecified: E78.5

## 2015-04-17 HISTORY — DX: Tobacco use: Z72.0

## 2015-04-17 LAB — GLUCOSE, CAPILLARY
GLUCOSE-CAPILLARY: 68 mg/dL (ref 65–99)
GLUCOSE-CAPILLARY: 94 mg/dL (ref 65–99)

## 2015-04-17 LAB — HEMOGLOBIN A1C
HEMOGLOBIN A1C: 5.9 % — AB (ref 4.8–5.6)
Hgb A1c MFr Bld: 5.8 % — ABNORMAL HIGH (ref 4.8–5.6)
MEAN PLASMA GLUCOSE: 120 mg/dL
Mean Plasma Glucose: 123 mg/dL

## 2015-04-17 MED ORDER — ASPIRIN 81 MG PO CHEW: 81.0000 mg | CHEWABLE_TABLET | Freq: Every day | ORAL | Status: AC

## 2015-04-17 MED ORDER — NITROGLYCERIN 0.4 MG SL SUBL: 0.4000 mg | SUBLINGUAL_TABLET | SUBLINGUAL | Status: DC | PRN

## 2015-04-17 MED ORDER — LISINOPRIL 10 MG PO TABS: 10.0000 mg | ORAL_TABLET | Freq: Every day | ORAL | Status: DC

## 2015-04-17 MED ORDER — ATORVASTATIN CALCIUM 80 MG PO TABS: 80.0000 mg | ORAL_TABLET | Freq: Every day | ORAL | Status: DC

## 2015-04-17 MED ORDER — PANTOPRAZOLE SODIUM 40 MG PO TBEC: 40.0000 mg | DELAYED_RELEASE_TABLET | Freq: Every day | ORAL | Status: DC

## 2015-04-17 MED ORDER — TICAGRELOR 90 MG PO TABS: 90.0000 mg | ORAL_TABLET | Freq: Two times a day (BID) | ORAL | Status: DC

## 2015-04-17 NOTE — Telephone Encounter (Signed)
Pt currently admitted, 3/2

## 2015-04-17 NOTE — Progress Notes (Signed)
Pt will have 47.00 per month copay for brilinta w no prior auth req.

## 2015-04-17 NOTE — Progress Notes (Signed)
CARDIAC REHAB PHASE I   PRE:  Rate/Rhythm: 72 SR  MODE:  Ambulation: 700 ft   POST:  Rate/Rhythm: 80 SR  BP:  Supine:   Sitting: 142/84  Standing:    SaO2:  1040-1105 Pt walked 700 ft with steady gait. No CP. Gave walking instructions with pt knowing that he must listen to his body and not adhere to them if any CP. Pt did not remember our discussion on why brilinta is so important. Reviewed again with pt that he must take twice a day and space out 12 hours and not ever run out. Wife not present but asked Student Nurse to make sure wife also aware of how important to take. Pt has written info on diet , ex and MI. Will need re enforcement. Encouraged him to have wife read materials also. We will see again when he returns for staged PCI and refer to CRP 2 at that time.   Graylon Good, RN BSN  04/17/2015 11:02 AM

## 2015-04-17 NOTE — Telephone Encounter (Signed)
TCM phone call  Appt on 04/24/15 at 12:10pm w/ Richardson Dopp at the [facility] office.   Thanks

## 2015-04-17 NOTE — Progress Notes (Addendum)
Cardiologist: Terry Rivers New  Subjective:   Inferior ST elevation myocardial infarction, chest pain, shortness of breath, dizziness while doing yard work, episode of vomiting. 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 Brother had myocardial infarction at age 67 and died. Mother had stroke at age 60, died. Never smoked. LDL 129.  As a history of bradycardia.  Overall he is doing well, no specific complaints, no further chest pain, shortness of breath. Feeling well, ready for discharge.  Objective:  Vital Signs in the last 24 hours: Temp:  [98.3 F (36.8 C)-98.6 F (37 C)] 98.5 F (36.9 C) (03/02 0513) Pulse Rate:  [48-56] 48 (03/02 0513) Resp:  [11-16] 16 (03/01 2145) BP: (112-141)/(59-87) 113/65 mmHg (03/02 0513) SpO2:  [96 %-98 %] 98 % (03/02 0513) Weight:  [166 lb 11.2 oz (75.615 kg)] 166 lb 11.2 oz (75.615 kg) (03/02 0513)  Intake/Output from previous day: 03/01 0701 - 03/02 0700 In: 1320 [P.O.:1320] Out: 1475 [Urine:1475]   Physical Exam: General: Well developed, well nourished, in no acute distress. Head:  Normocephalic and atraumatic. Lungs: Clear to auscultation and percussion. Heart: Normal S1 and S2.  No murmur, rubs or gallops.  Abdomen: soft, non-tender, positive bowel sounds. Extremities: No clubbing or cyanosis. No edema. Cardiac catheterization site normal Neurologic: Alert and oriented x 3.    Lab Results:  Recent Labs  04/15/15 2000 04/16/15 0507  WBC 10.9* 10.5  HGB 12.6* 12.5*  PLT 205 188    Recent Labs  04/15/15 2000 04/16/15 0507  NA 141 141  K 4.1 3.9  CL 111 111  CO2 21* 20*  GLUCOSE 117* 115*  BUN 9 7  CREATININE 0.93 0.87    Recent Labs  04/16/15 0025 04/16/15 0507  TROPONINI 15.82* 11.79*   Hepatic Function Panel  Recent Labs  04/15/15 2000  PROT 6.7  ALBUMIN 3.7  AST 58*  ALT 35  ALKPHOS 31*  BILITOT 0.6    Recent Labs  04/16/15 0507  CHOL 155      Telemetry: No adverse arrhythmias, no VT. Sinus  bradycardia noted at times in the upper 30s, low 40s. Personally viewed.   EKG:  04/16/15-T wave inversion noted in 3, aVF, mild in 2. No ST segment elevation at this point. Personally viewed.  Cardiac Studies:  Cardiac catheterization 04/15/15 at 4 PM  Prox LAD to Mid LAD lesion, 70% stenosed.  1st Diag lesion, 75% stenosed.  Prox Cx to Mid Cx lesion, 80% stenosed.  Prox RCA lesion, 100% stenosed. Post intervention, there is a 0% residual stenosis.  1. 3 vessel obstructive CAD 2. Culprit lesion in a large RCA 3. Normal LV EDP 4. Successful stenting of the proximal to mid RCA with a DES.   Plan: DAPT for one year. Will assess LV function with an Echo. Statin therapy. Avoid beta blocker given history of bradycardia. I would recommend staged PCI of the LCx, diagonal, and possibly the LAD (consider FFR). Given the complexity of disease I would favor doing this at a later date after he has recovered from his acute infarct- Perhaps in a few weeks depending on clinical course  Dr. [name] Rivers  ECHO 04/16/15: - Left ventricle: The cavity size was normal. There was mild concentric hypertrophy. Systolic function was normal. The estimated ejection fraction was in the range of 50% to 55%. Wall motion was normal; there were no regional wall motion abnormalities. Doppler parameters are consistent with abnormal left ventricular relaxation (grade 1 diastolic dysfunction). - Aortic valve: Transvalvular velocity was  within the normal range. There was no stenosis. There was mild regurgitation. - Mitral valve: There was no regurgitation. - Right ventricle: The cavity size was normal. Wall thickness was normal. Systolic function was normal. - Tricuspid valve: There was no regurgitation.  Meds: Scheduled Meds: . aspirin  81 mg Oral Daily  . atorvastatin  80 mg Oral q1800  . heparin  5,000 Units Subcutaneous 3 times per day  . lisinopril  10 mg Oral Daily  . pantoprazole  40 mg Oral  Daily  . sodium chloride flush  3 mL Intravenous Q12H  . ticagrelor  90 mg Oral BID   Continuous Infusions:  PRN Meds:.sodium chloride, acetaminophen, nitroGLYCERIN, ondansetron (ZOFRAN) IV, sodium chloride flush  Assessment/Plan:  Principal Problem:   STEMI (ST elevation myocardial infarction) ([place]) Active Problems:   Bradycardia   Diabetes mellitus without complication (HCC)   ST elevation (STEMI) myocardial infarction involving right coronary artery (HCC)  Inferior ST elevation myocardial infarction - 100% occluded RCA, stented, DES - Residual circumflex, LAD disease (severe three-vessel disease) - Await echocardiogram for left ventricular function - Plan is to take him back to the Cath Lab at a later date, perhaps several weeks to allow to heal from this acute infarct. - Dual antiplatelet therapy for one year - High-dose atorvastatin 80 mg - LDL 85 - No beta blocker secondary to bradycardia.   Essential hypertension - will add lisinopril 10mg  QD  Bradycardia - As above, avoiding beta blockers - He has had this issue previously  Diabetes without complications - Listed on problem list - Glucose mildly elevated 115 - Hemoglobin A1c only 5.8 - I will take DM off list.   OK with discharge. Encourage cessation of chewing tobacco  Terry Rivers, Terry Rivers 04/17/2015, 10:13 AM

## 2015-04-17 NOTE — Discharge Summary (Signed)
Physician Discharge Summary       Patient ID: Terry Rivers MRN: [id number] DOB/AGE: 1950/07/23 65 y.o.  Admit date: 04/15/2015 Discharge date: 04/17/2015 Primary Cardiologist:Dr. [name]    Discharge Diagnoses:  Principal Problem:   ST elevation (STEMI) myocardial infarction involving right coronary artery [facility]) Active Problems:   CAD, multiple vessel-native arteries, emergent PCI RCA with DES 04/15/15   Bradycardia   Tobacco use   Hyperlipidemia LDL goal <70   Benign essential HTN   Discharged Condition: good  Procedures: Emergent cardiac cath 04/15/15 by Dr. [name] 04/15/15 emergent PCI of 100% RCA with DES. By Dr. [name]   Prox LAD to Mid LAD lesion, 70% stenosed.  1st Diag lesion, 75% stenosed.  Prox Cx to Mid Cx lesion, 80% stenosed.  Prox RCA lesion, 100% stenosed. Post intervention, there is a 0% residual stenosis.  1. 3 vessel obstructive CAD 2. Culprit lesion in a large RCA 3. Normal LV EDP 4. Successful stenting of the proximal to mid RCA with a DES.   Plan: DAPT for one year. Will assess LV function with an Echo. Statin therapy. Avoid beta blocker given history of bradycardia. I would recommend staged PCI of the LCx, diagonal, and possibly the LAD (consider FFR). Given the complexity of disease I would favor doing this at a later date after he has recovered from his acute infarct- Perhaps in a few weeks depending on clinical course.    Hospital Course:   65 y.o. male with a history of bradycardia, diet controlled DM and arthritis who presented to [facility] by EMS with inferror STEMI.  He had sudden onset of chest pain with sob and dizziness while doing some yard work. Otherwise had been in usual state of health.  The pain persisted and he went home where he had an episode of vomiting. Wife took to fire department where EKG showed inferior STEMI and brought by EMS for emergent cath. He was given morphine 60m, asa 3274mand 2 SL nitro with improvement of chest pain.   (Strong family hx premature CAD)  With cath he was found to have multivessel disease with culprit vessel vessel 100% stenosed RCA undergoing PCI DES successfully. He hs residual disease of LCX and  LAD and diag that will need staged intervention in 5-6 weeks.    His troponin pk at 15.82 He has elevated BP and meds adjusted.  EF on Echo was 50-55%. Pt transferred to tele day 2 and by day 3 he was stable seen and evaluated by Dr. SkMarlou Porchnd after ambulation with Cardiac rehab without problems found stable for discharge. His glycoHgbA1c was 5.9 so Dr. SkMarlou Porchemoved diabetic diagnosis.  Pt d/c'd on BB, ACE, statin at 80 mg Brilinta, and asa   Cardiac rehab after staged procedure.   Will Need outpatient CXR   TCM pt.   Consults: None  Significant Diagnostic Studies:  BMP Latest Ref Rng 04/16/2015 04/15/2015 04/15/2015  Glucose 65 - 99 mg/dL 115(H) 117(H) 139(H)  BUN 6 - 20 mg/dL '7 9 12  ' Creatinine 0.61 - 1.24 mg/dL 0.87 0.93 0.80  Sodium 135 - 145 mmol/L 141 141 144  Potassium 3.5 - 5.1 mmol/L 3.9 4.1 3.3(L)  Chloride 101 - 111 mmol/L 111 111 107  CO2 22 - 32 mmol/L 20(L) 21(L) -  Calcium 8.9 - 10.3 mg/dL 8.7(L) 8.6(L) -   CBC Latest Ref Rng 04/16/2015 04/15/2015 04/15/2015  WBC 4.0 - 10.5 K/uL 10.5 10.9(H) -  Hemoglobin 13.0 - 17.0 g/dL 12.5(L) 12.6(L) 12.9(L)  Hematocrit 39.0 - 52.0 % 36.7(L) 36.8(L) 38.0(L)  Platelets 150 - 400 K/uL 188 205 -   pk troponin 15.82  Lipid Panel     Component Value Date/Time   CHOL 155 04/16/2015 0507   TRIG 248* 04/16/2015 0507   HDL 20* 04/16/2015 0507   CHOLHDL 7.8 04/16/2015 0507   VLDL 50* 04/16/2015 0507   LDLCALC 85 04/16/2015 0507   LDLDIRECT 129.0 05/24/2014 0946   Hepatic Function Latest Ref Rng 04/15/2015 04/15/2015 05/24/2014  Total Protein 6.5 - 8.1 g/dL 6.7 6.5 7.9  Albumin 3.5 - 5.0 g/dL 3.7 3.6 4.4  AST 15 - 41 U/L 58(H) 31 17  ALT 17 - 63 U/L 35 34 16  Alk Phosphatase 38 - 126 U/L 31(L) 30(L) 38(L)  Total Bilirubin 0.3 - 1.2 mg/dL  0.6 0.5 0.5  Bilirubin, Direct 0.0 - 0.3 mg/dL - - 0.1    hgBA1c 5.9 TSH 3.419    ECHO 04/16/15: - Left ventricle: The cavity size was normal. There was mild concentric hypertrophy. Systolic function was normal. The estimated ejection fraction was in the range of 50% to 55%. Wall motion was normal; there were no regional wall motion abnormalities. Doppler parameters are consistent with abnormal left ventricular relaxation (grade 1 diastolic dysfunction). - Aortic valve: Transvalvular velocity was within the normal range. There was no stenosis. There was mild regurgitation. - Mitral valve: There was no regurgitation. - Right ventricle: The cavity size was normal. Wall thickness was normal. Systolic function was normal. - Tricuspid valve: There was no regurgitation.    Discharge Exam: Blood pressure 113/65, pulse 48, temperature 98.5 F (36.9 C), temperature source Oral, resp. rate 16, height '5\' 5"'  (1.651 m), weight 166 lb 11.2 oz (75.615 kg), SpO2 98 %.  Disposition: 01-Home or Self Care      Discharge Instructions    AMB Referral to Cardiac Rehabilitation - Phase II    Complete by:  As directed   Diagnosis:  Myocardial Infarction            Medication List    STOP taking these medications        ibuprofen 200 MG tablet  Commonly known as:  ADVIL,MOTRIN      TAKE these medications        acetaminophen 325 MG tablet  Commonly known as:  TYLENOL  Take 650 mg by mouth every 6 (six) hours as needed for mild pain.     aspirin 81 MG chewable tablet  Chew 1 tablet (81 mg total) by mouth daily.     atorvastatin 80 MG tablet  Commonly known as:  LIPITOR  Take 1 tablet (80 mg total) by mouth daily at 6 PM.     lisinopril 10 MG tablet  Commonly known as:  PRINIVIL,ZESTRIL  Take 1 tablet (10 mg total) by mouth daily.     nitroGLYCERIN 0.4 MG SL tablet  Commonly known as:  NITROSTAT  Place 1 tablet (0.4 mg total) under the tongue every 5 (five) minutes  x 3 doses as needed for chest pain.     pantoprazole 40 MG tablet  Commonly known as:  PROTONIX  Take 1 tablet (40 mg total) by mouth daily.     ticagrelor 90 MG Tabs tablet  Commonly known as:  BRILINTA  Take 1 tablet (90 mg total) by mouth 2 (two) times daily. This is for 30 day free card       Follow-up Information    Follow up with Richardson Dopp, PA-C  On 04/24/2015.   Specialties:  Physician Assistant, Radiology, Interventional Cardiology   Why:  at 1210 pm this is one of Dr. Doug Sou PAs   Contact information:   [address] [id number] [phone]        Discharge Instructions: Take 1 NTG, under your tongue, while sitting.  If no relief of pain may repeat NTG, one tab every 5 minutes up to 3 tablets total over 15 minutes.  If no relief CALL 911.  If you have dizziness/lightheadness  while taking NTG, stop taking and call 911.        Call [facility] at [phone] if any bleeding, swelling or drainage at cath site.  May shower, no tub baths for 48 hours for groin sticks. No lifting over 5 pounds for 10 days.  No Driving for 10 days  Heart Healthy Diet  It would benefit you to stop tobacco.  Do not stop Brilinta or asprin-they keep the stent open  Signed: ONGEXB,MWUXL R Nurse Practitioner-Certified [facility]: [facility] 04/17/2015, 5:59 PM  Time spent on discharge : > 30  minutes.    Personally seen and examined. Agree with above.  Cardiologist: [name]  Subjective:   Inferior ST elevation myocardial infarction, chest pain, shortness of breath, dizziness while doing yard work, episode of vomiting. 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 Brother had myocardial infarction at age 44 and died. Mother had stroke at age 69, died. Never smoked. LDL 129.  As a history of bradycardia.  Overall he is doing well, no specific complaints, no further chest pain, shortness of breath. Feeling well,  ready for discharge.  Objective:  Vital Signs in the last 24 hours: Temp: [98.3 F (36.8 C)-98.6 F (37 C)] 98.5 F (36.9 C) (03/02 0513) Pulse Rate: [48-56] 48 (03/02 0513) Resp: [11-16] 16 (03/01 2145) BP: (112-141)/(59-87) 113/65 mmHg (03/02 0513) SpO2: [96 %-98 %] 98 % (03/02 0513) Weight: [166 lb 11.2 oz (75.615 kg)] 166 lb 11.2 oz (75.615 kg) (03/02 0513)  Intake/Output from previous day: 03/01 0701 - 03/02 0700 In: 1320 [P.O.:1320] Out: 1475 [Urine:1475]   Physical Exam: General: Well developed, well nourished, in no acute distress. Head: Normocephalic and atraumatic. Lungs: Clear to auscultation and percussion. Heart: Normal S1 and S2. No murmur, rubs or gallops.  Abdomen: soft, non-tender, positive bowel sounds. Extremities: No clubbing or cyanosis. No edema. Cardiac catheterization site normal Neurologic: Alert and oriented x 3.   Lab Results:  Recent Labs (last 2 labs)      Recent Labs  04/15/15 2000 04/16/15 0507  WBC 10.9* 10.5  HGB 12.6* 12.5*  PLT 205 188      Recent Labs (last 2 labs)      Recent Labs  04/15/15 2000 04/16/15 0507  NA 141 141  K 4.1 3.9  CL 111 111  CO2 21* 20*  GLUCOSE 117* 115*  BUN 9 7  CREATININE 0.93 0.87      Recent Labs (last 2 labs)      Recent Labs  04/16/15 0025 04/16/15 0507  TROPONINI 15.82* 11.79*     Hepatic Function Panel  Recent Labs (last 2 labs)      Recent Labs  04/15/15 2000  PROT 6.7  ALBUMIN 3.7  AST 58*  ALT 35  ALKPHOS 31*  BILITOT 0.6      Recent Labs (last 2 labs)      Recent Labs  04/16/15 0507  CHOL 155  Telemetry: No adverse arrhythmias, no VT. Sinus bradycardia noted at times in the upper 30s, low 40s. Personally viewed.   EKG: 04/16/15-T wave inversion noted in 3, aVF, mild in 2. No ST segment elevation at this point. Personally viewed.  Cardiac Studies: Cardiac catheterization 2015/04/21 at  4 PM  Prox LAD to Mid LAD lesion, 70% stenosed.  1st Diag lesion, 75% stenosed.  Prox Cx to Mid Cx lesion, 80% stenosed.  Prox RCA lesion, 100% stenosed. Post intervention, there is a 0% residual stenosis.  1. 3 vessel obstructive CAD 2. Culprit lesion in a large RCA 3. Normal LV EDP 4. Successful stenting of the proximal to mid RCA with a DES.   Plan: DAPT for one year. Will assess LV function with an Echo. Statin therapy. Avoid beta blocker given history of bradycardia. I would recommend staged PCI of the LCx, diagonal, and possibly the LAD (consider FFR). Given the complexity of disease I would favor doing this at a later date after he has recovered from his acute infarct- Perhaps in a few weeks depending on clinical course  Dr. Peter [name]  ECHO 04/16/15: - Left ventricle: The cavity size was normal. There was mild concentric hypertrophy. Systolic function was normal. The estimated ejection fraction was in the range of 50% to 55%. Wall motion was normal; there were no regional wall motion abnormalities. Doppler parameters are consistent with abnormal left ventricular relaxation (grade 1 diastolic dysfunction). - Aortic valve: Transvalvular velocity was within the normal range. There was no stenosis. There was mild regurgitation. - Mitral valve: There was no regurgitation. - Right ventricle: The cavity size was normal. Wall thickness was normal. Systolic function was normal. - Tricuspid valve: There was no regurgitation.  Meds: Scheduled Meds: . aspirin 81 mg Oral Daily  . atorvastatin 80 mg Oral q1800  . heparin 5,000 Units Subcutaneous 3 times per day  . lisinopril 10 mg Oral Daily  . pantoprazole 40 mg Oral Daily  . sodium chloride flush 3 mL Intravenous Q12H  . ticagrelor 90 mg Oral BID   Continuous Infusions:  PRN Meds:.sodium chloride, acetaminophen, nitroGLYCERIN, ondansetron (ZOFRAN) IV, sodium chloride  flush  Assessment/Plan:  Principal Problem:  STEMI (ST elevation myocardial infarction) ([place]) Active Problems:  Bradycardia  Diabetes mellitus without complication (HCC)  ST elevation (STEMI) myocardial infarction involving right coronary artery (HCC)  Inferior ST elevation myocardial infarction - 100% occluded RCA, stented, DES - Residual circumflex, LAD disease (severe three-vessel disease) - Await echocardiogram for left ventricular function - Plan is to take him back to the Cath Lab at a later date, perhaps several weeks to allow to heal from this acute infarct. - Dual antiplatelet therapy for one year - High-dose atorvastatin 80 mg - LDL 85 - No beta blocker secondary to bradycardia.   Essential hypertension - will add lisinopril 74m QD  Bradycardia - As above, avoiding beta blockers - He has had this issue previously  Diabetes without complications - Listed on problem list - Glucose mildly elevated 115 - Hemoglobin A1c only 5.8 - I will take DM off list.   OK with discharge. Encourage cessation of chewing tobacco  SKAINS, MG[place]ada3/03/2015, 10:13 AM

## 2015-04-17 NOTE — Discharge Instructions (Signed)
Take 1 NTG, under your tongue, while sitting.  If no relief of pain may repeat NTG, one tab every 5 minutes up to 3 tablets total over 15 minutes.  If no relief CALL 911.  If you have dizziness/lightheadness  while taking NTG, stop taking and call 911.        Call [facility] at [phone] if any bleeding, swelling or drainage at cath site.  May shower, no tub baths for 48 hours for groin sticks. No lifting over 5 pounds for 10 days.  No Driving for 10 days  Heart Healthy Diet  It would benefit you to stop tobacco.  Do not stop Brilinta or asprin-they keep the stent open

## 2015-04-18 NOTE — Telephone Encounter (Signed)
Patient contacted regarding discharge from [facility] on 04/17/2015. Spoke with wife, Suanne Marker ([place]).  Patient understands to follow up with provider Richardson Dopp, PA on 04/24/2015 at 12:10p at [facility]. Patient understands discharge instructions? yes Patient understands medications and regiment? yes Patient understands to bring all medications to this visit? yes

## 2015-04-18 NOTE — Telephone Encounter (Signed)
Called patient and left voice message to call office

## 2015-04-23 NOTE — Progress Notes (Signed)
Cardiology Office Note:    Date:  04/24/2015   ID:  Terry Rivers, DOB Jun 25, 1950, MRN [id number]  PCP:  [name], MD  Cardiologist:  Dr. Peter [name]   Electrophysiologist:  n/a  Chief Complaint  Patient presents with  . Hospitalization Follow-up    s/p Inf STEMI >> PCI to RCA    History of Present Illness:     Terry Rivers is a 65 y.o. male with a hx of bradycardia, DM (diet controlled), DJD.    Admitted 2/28-3/2 with inferior STEMI.  LHC demonstrated multivessel CAD with occluded RCA as the culprit. RCA was treated with DES.  He has residual LAD/Dx and LCx disease that will needed staged PCI in several weeks.  He was not started on beta-blocker due to bradycardia.  Returns for FU.  Here with his wife. He is doing well. He has had a couple episodes of atypical chest pain. He denies any recurrent angina. He has been walking daily. Denies chest pain with exertion or DOE. Denies syncope, dizziness.  Denies orthopnea, PND, edema.  He has had occ episodes of dyspnea. No wheezing, cough.  No bleeding issues.  He has been adherent to dual antiplatelet Rx.   Past Medical History  Diagnosis Date  . Arthritis   . Chicken pox   . Frequent headaches   . Bradycardia     low 50"s per wife  . Diabetes mellitus without complication (HCC)     Borderline/no meds  . Tobacco use 04/17/2015  . Hyperlipidemia LDL goal <70 04/17/2015  . CAD, multiple vessel-native arteries, emergent PCI RCA with DES 04/15/15 04/17/2015    Past Surgical History  Procedure Laterality Date  . Skin cancer excision      right forearm/pre-cancerous/ 2 spots on left arm  . Cardiac catheterization N/A 04/15/2015    Procedure: Left Heart Cath and Coronary Angiography;  Surgeon: Peter M [name], MD;  Location: [facility];  Service: Cardiovascular;  Laterality: N/A;  . Cardiac catheterization N/A 04/15/2015    Procedure: Coronary Stent Intervention;  Surgeon: Peter M [name], MD;  Location: [facility];   Service: Cardiovascular;  Laterality: N/A;    Current Medications: Outpatient Prescriptions Prior to Visit  Medication Sig Dispense Refill  . acetaminophen (TYLENOL) 325 MG tablet Take 650 mg by mouth every 6 (six) hours as needed for mild pain.    Marland Kitchen aspirin 81 MG chewable tablet Chew 1 tablet (81 mg total) by mouth daily.    Marland Kitchen atorvastatin (LIPITOR) 80 MG tablet Take 1 tablet (80 mg total) by mouth daily at 6 PM. 30 tablet 6  . lisinopril (PRINIVIL,ZESTRIL) 10 MG tablet Take 1 tablet (10 mg total) by mouth daily. 30 tablet 6  . nitroGLYCERIN (NITROSTAT) 0.4 MG SL tablet Place 1 tablet (0.4 mg total) under the tongue every 5 (five) minutes x 3 doses as needed for chest pain. 25 tablet 4  . pantoprazole (PROTONIX) 40 MG tablet Take 1 tablet (40 mg total) by mouth daily. 30 tablet 6  . ticagrelor (BRILINTA) 90 MG TABS tablet Take 1 tablet (90 mg total) by mouth 2 (two) times daily. This is for 30 day free card 60 tablet 0   No facility-administered medications prior to visit.     Allergies:   Penicillins and Prednisone   Social History   Social History  . Marital Status: Married    Spouse Name: N/A  . Number of Children: N/A  . Years of Education: N/A  Social History Main Topics  . Smoking status: Never Smoker   . Smokeless tobacco: Current User    Types: Chew     Comment: chew every day  . Alcohol Use: No  . Drug Use: No  . Sexual Activity: Not Asked   Other Topics Concern  . None   Social History Narrative     Family History:  The patient's family history includes Brain cancer in his sister; Cancer in his brother and father; Colon cancer in his brother and father; Kidney cancer in his brother, sister, sister, sister, and sister; Stroke in his mother.   ROS:   Please see the history of present illness.    Review of Systems  Cardiovascular: Positive for chest pain.  Respiratory: Positive for shortness of breath.   Musculoskeletal: Positive for back pain.  All other  systems reviewed and are negative.   Physical Exam:    VS:  BP 114/80 mmHg  Pulse 63  Ht 5\' 5"  (1.651 m)  Wt 164 lb (74.39 kg)  BMI 27.29 kg/m2   GEN: Well nourished, well developed, in no acute distress HEENT: normal Neck: no JVD, no masses Cardiac: Normal S1/S2, RRR; no murmurs, no edema; right wrist without hematoma or mass    Respiratory:  clear to auscultation bilaterally; no wheezing, rhonchi or rales GI: soft, nontender,  MS: no deformity or atrophy Skin: warm and dry,  Neuro:    no focal deficits  Psych: Alert and oriented x 3, normal affect  Wt Readings from Last 3 Encounters:  04/24/15 164 lb (74.39 kg)  04/17/15 166 lb 11.2 oz (75.615 kg)  09/10/14 164 lb (74.39 kg)      Studies/Labs Reviewed:     EKG:  EKG is  ordered today.  The ekg ordered today demonstrates NSR, HR 63, TWI 2, 3, aVF, V4-6, QTc 386 ms, no change   Recent Labs: 04/15/2015: ALT 35; TSH 3.419 04/16/2015: BUN 7; Creatinine, Ser 0.87; Hemoglobin 12.5*; Platelets 188; Potassium 3.9; Sodium 141   Recent Lipid Panel    Component Value Date/Time   CHOL 155 04/16/2015 0507   TRIG 248* 04/16/2015 0507   HDL 20* 04/16/2015 0507   CHOLHDL 7.8 04/16/2015 0507   VLDL 50* 04/16/2015 0507   LDLCALC 85 04/16/2015 0507   LDLDIRECT 129.0 05/24/2014 0946    Additional studies/ records that were reviewed today include:   Echo 04/16/15 Mild LVH, EF 50-55%, no RWMA, Gr 1 DD, mild AI  LHC 04/15/15 LAD prox 70%, D1 75% LCx prox 80% RCA 100% PCI:  3.5 x 32 mm Synergy DES to pRCA 1. 3 vessel obstructive CAD 2. Culprit lesion in a large RCA 3. Normal LV EDP 4. Successful stenting of the proximal to mid RCA with a DES.  Plan: DAPT for one year. Will assess LV function with an Echo. Statin therapy. Avoid beta blocker given history of bradycardia. I would recommend staged PCI of the LCx, diagonal, and possibly the LAD (consider FFR). Given the complexity of disease I would favor doing this at a later date after  he has recovered from his acute infarct- Perhaps in a few weeks depending on clinical course.          ASSESSMENT:     1. CAD in native artery   2. Benign essential hypertension   3. Hyperlipidemia   4. Tobacco use     PLAN:     In order of problems listed above:  1. CAD - Patient presents for FU  s/p inf STEMI tx with DES to RCA.  He has residual LAD and LCx disease. Staged PCI has been recommended.  Patient is doing well without recurrent angina.  He has been adherent with his medications. I discussed his case with Dr. Peter [name] by phone today. We will set the patient up for staged PCI in the next 2 weeks.  -  Arrange Staged PCI with Dr. Peter [name] in 1-2 weeks.  -  Continue ASA, Brilinta, Lipitor 80, Lisinopril 10 QD  -  No beta blocker 2/2 hx of Bradycardia  -  BMET, CBC, INR today  2. HTN - BP controlled.   3. HL - Continue statin.  Lipids and LFTs 6 weeks.   4. Tobacco Abuse - I have advised him to stop using smokeless tobacco     Medication Adjustments/Labs and Tests Ordered: Current medicines are reviewed at length with the patient today.  Concerns regarding medicines are outlined above.  Medication changes, Labs and Tests ordered today are outlined in the Patient Instructions noted below. Patient Instructions  Medication Instructions:   Your physician recommends that you continue on your current medications as directed. Please refer to the Current Medication list given to you today.  If you need a refill on your cardiac medications before your next appointment, please call your pharmacy.  Labwork:  CBC BMET PT/INR  RETURN FOR LABS IN 6 WEEKS LIPIDS AND LFT   Testing/Procedures:  SEE LETTER FOR CATH 05/06/15  Follow-Up: WITH DR [name] FOR 2 WEEKS POST CATH   Any Other Special Instructions Will Be Listed Below (If Applicable).     Signed, Terry Dopp, PA-C  04/24/2015 1:53 PM    [facility] [place], [place], [place]   [address] Phone: [phone]; Fax: [phone]

## 2015-04-24 ENCOUNTER — Ambulatory Visit (INDEPENDENT_AMBULATORY_CARE_PROVIDER_SITE_OTHER): Payer: Medicare Other | Admitting: Physician Assistant

## 2015-04-24 ENCOUNTER — Other Ambulatory Visit: Payer: Self-pay | Admitting: *Deleted

## 2015-04-24 ENCOUNTER — Encounter: Payer: Self-pay | Admitting: Physician Assistant

## 2015-04-24 ENCOUNTER — Encounter: Payer: Self-pay | Admitting: *Deleted

## 2015-04-24 VITALS — BP 114/80 | HR 63 | Ht 65.0 in | Wt 164.0 lb

## 2015-04-24 DIAGNOSIS — I10 Essential (primary) hypertension: Secondary | ICD-10-CM

## 2015-04-24 DIAGNOSIS — Z720 Tobacco use: Secondary | ICD-10-CM | POA: Diagnosis not present

## 2015-04-24 DIAGNOSIS — E785 Hyperlipidemia, unspecified: Secondary | ICD-10-CM | POA: Diagnosis not present

## 2015-04-24 DIAGNOSIS — I2510 Atherosclerotic heart disease of native coronary artery without angina pectoris: Secondary | ICD-10-CM | POA: Diagnosis not present

## 2015-04-24 LAB — CBC
HEMATOCRIT: 44.8 % (ref 39.0–52.0)
HEMOGLOBIN: 15.2 g/dL (ref 13.0–17.0)
MCH: 31 pg (ref 26.0–34.0)
MCHC: 33.9 g/dL (ref 30.0–36.0)
MCV: 91.2 fL (ref 78.0–100.0)
MPV: 8.9 fL (ref 8.6–12.4)
Platelets: 319 10*3/uL (ref 150–400)
RBC: 4.91 MIL/uL (ref 4.22–5.81)
RDW: 13.7 % (ref 11.5–15.5)
WBC: 8.9 10*3/uL (ref 4.0–10.5)

## 2015-04-24 LAB — BASIC METABOLIC PANEL
BUN: 14 mg/dL (ref 7–25)
CALCIUM: 9.8 mg/dL (ref 8.6–10.3)
CHLORIDE: 102 mmol/L (ref 98–110)
CO2: 24 mmol/L (ref 20–31)
CREATININE: 1.03 mg/dL (ref 0.70–1.25)
GLUCOSE: 88 mg/dL (ref 65–99)
Potassium: 4.7 mmol/L (ref 3.5–5.3)
Sodium: 138 mmol/L (ref 135–146)

## 2015-04-24 MED ORDER — TICAGRELOR 90 MG PO TABS: 90.0000 mg | ORAL_TABLET | Freq: Two times a day (BID) | ORAL | Status: DC

## 2015-04-24 NOTE — Patient Instructions (Addendum)
Medication Instructions:   Your physician recommends that you continue on your current medications as directed. Please refer to the Current Medication list given to you today.  If you need a refill on your cardiac medications before your next appointment, please call your pharmacy.  Labwork:  CBC BMET PT/INR  RETURN FOR LABS IN 6 WEEKS LIPIDS AND LFT   Testing/Procedures:  SEE LETTER FOR CATH 05/06/15  Follow-Up: WITH DR [name] FOR 2 WEEKS POST CATH   Any Other Special Instructions Will Be Listed Below (If Applicable).

## 2015-04-25 LAB — PROTIME-INR
INR: 1.1 (ref <1.50)
Prothrombin Time: 14.3 seconds (ref 11.6–15.2)

## 2015-04-28 ENCOUNTER — Telehealth: Payer: Self-pay | Admitting: Cardiology

## 2015-04-28 ENCOUNTER — Telehealth: Payer: Self-pay | Admitting: Physician Assistant

## 2015-04-28 DIAGNOSIS — R0602 Shortness of breath: Secondary | ICD-10-CM

## 2015-04-28 NOTE — Telephone Encounter (Signed)
Returned call to patient's wife.She stated she received a call from [facility] cardiac rehab.Stated he rather have at [facility].Stated he is scheduled for PCI on 05/06/15.Stated he does not have post hospital appointment scheduled yet with Dr.Jordan.Stated he would like appointment scheduled with Dr.Jordan and not a PA.Advised he should wait to start cardiac rehab,after heart procedure.I will let Verdis Frederickson at [facility] cardiac rehab know.Post hospital appointment scheduled with Dr.Jordan 05/26/15 at 4:30 pm.

## 2015-04-28 NOTE — Telephone Encounter (Signed)
DPR for wife, who has been advised that pt will need to have CXR before PCI 3/21 w/Dr. [name]. Order in system for [employer] at [facility]. Wife asked if cath pre-certed yet. I advised I am sure Dr. Doug Sou RN took care of this.

## 2015-04-28 NOTE — Telephone Encounter (Signed)
Please call,wants to know if pt is supposed to have Cardiac Rehab?

## 2015-04-28 NOTE — Telephone Encounter (Signed)
Patient needs CXR before next PCI. Please arrange. Order put in for [facility]. I was contacted by the discharging NP from the hospital.  He never had CXR done in the hospital and should have one. Richardson Dopp, PA-C   04/28/2015 4:34 PM

## 2015-04-30 ENCOUNTER — Ambulatory Visit
Admission: RE | Admit: 2015-04-30 | Discharge: 2015-04-30 | Disposition: A | Discharge status: Home / Self Care | Payer: Medicare Other | Source: Ambulatory Visit | Attending: Physician Assistant | Admitting: Physician Assistant

## 2015-04-30 DIAGNOSIS — R0602 Shortness of breath: Secondary | ICD-10-CM | POA: Diagnosis not present

## 2015-05-01 ENCOUNTER — Telehealth: Payer: Self-pay | Admitting: *Deleted

## 2015-05-01 NOTE — Telephone Encounter (Signed)
DPR for wife who has been notified of CXR results normal for pt. She said thank you and she will let pt know.

## 2015-05-05 ENCOUNTER — Telehealth: Payer: Self-pay | Admitting: Physician Assistant

## 2015-05-05 NOTE — Telephone Encounter (Signed)
DPR for wife. I returned their call in regards if pt was supposed to still take Brilinta before his cath 3/21. I advised yes, and all he really needs to take is the ASA and Brilinta tomorrow morning w/small amount of water in the AM. Wife verbalized ok

## 2015-05-05 NOTE — Telephone Encounter (Signed)
New message      Pt is having a procedure tomorrow am.  Calling to see if he should continue taking his brilinta?  Please call

## 2015-05-06 ENCOUNTER — Encounter (HOSPITAL_COMMUNITY): Payer: Self-pay | Admitting: General Practice

## 2015-05-06 ENCOUNTER — Encounter (HOSPITAL_COMMUNITY)
Admission: RE | Disposition: A | Discharge status: Home / Self Care | Payer: Self-pay | Source: Ambulatory Visit | Attending: Cardiology

## 2015-05-06 ENCOUNTER — Ambulatory Visit (HOSPITAL_COMMUNITY)
Admission: RE | Admit: 2015-05-06 | Discharge: 2015-05-07 | Disposition: A | Discharge status: Home / Self Care | Payer: Medicare Other | Source: Ambulatory Visit | Attending: Cardiology | Admitting: Cardiology

## 2015-05-06 DIAGNOSIS — E119 Type 2 diabetes mellitus without complications: Secondary | ICD-10-CM | POA: Insufficient documentation

## 2015-05-06 DIAGNOSIS — I252 Old myocardial infarction: Secondary | ICD-10-CM | POA: Diagnosis not present

## 2015-05-06 DIAGNOSIS — Z880 Allergy status to penicillin: Secondary | ICD-10-CM | POA: Diagnosis not present

## 2015-05-06 DIAGNOSIS — I2510 Atherosclerotic heart disease of native coronary artery without angina pectoris: Secondary | ICD-10-CM | POA: Diagnosis not present

## 2015-05-06 DIAGNOSIS — I10 Essential (primary) hypertension: Secondary | ICD-10-CM | POA: Insufficient documentation

## 2015-05-06 DIAGNOSIS — Z955 Presence of coronary angioplasty implant and graft: Secondary | ICD-10-CM

## 2015-05-06 DIAGNOSIS — Z85828 Personal history of other malignant neoplasm of skin: Secondary | ICD-10-CM | POA: Insufficient documentation

## 2015-05-06 DIAGNOSIS — Z7982 Long term (current) use of aspirin: Secondary | ICD-10-CM | POA: Diagnosis not present

## 2015-05-06 DIAGNOSIS — I2111 ST elevation (STEMI) myocardial infarction involving right coronary artery: Secondary | ICD-10-CM

## 2015-05-06 DIAGNOSIS — E785 Hyperlipidemia, unspecified: Secondary | ICD-10-CM | POA: Diagnosis present

## 2015-05-06 HISTORY — PX: CARDIAC CATHETERIZATION: SHX172

## 2015-05-06 HISTORY — DX: Acute myocardial infarction, unspecified: I21.9

## 2015-05-06 HISTORY — PX: CORONARY STENT PLACEMENT: SHX1402

## 2015-05-06 HISTORY — DX: Essential (primary) hypertension: I10

## 2015-05-06 LAB — GLUCOSE, CAPILLARY
GLUCOSE-CAPILLARY: 99 mg/dL (ref 65–99)
GLUCOSE-CAPILLARY: 97 mg/dL (ref 65–99)
GLUCOSE-CAPILLARY: 96 mg/dL (ref 65–99)

## 2015-05-06 LAB — POCT ACTIVATED CLOTTING TIME
Activated Clotting Time: 358 seconds
Activated Clotting Time: 255 seconds

## 2015-05-06 SURGERY — CORONARY STENT INTERVENTION

## 2015-05-06 MED ORDER — ASPIRIN 81 MG PO CHEW
81.0000 mg | CHEWABLE_TABLET | Freq: Every day | ORAL | Status: DC
  Administered 2015-05-07: 11:00:00 81 mg via ORAL
  Filled 2015-05-06: qty 1

## 2015-05-06 MED ORDER — IOPAMIDOL (ISOVUE-370) INJECTION 76%
INTRAVENOUS | Status: AC
  Filled 2015-05-06: qty 50

## 2015-05-06 MED ORDER — SODIUM CHLORIDE 0.9 % IV SOLN: 250.0000 mL | INTRAVENOUS | Status: DC | PRN

## 2015-05-06 MED ORDER — NITROGLYCERIN 1 MG/10 ML FOR IR/CATH LAB
INTRA_ARTERIAL | Status: DC | PRN
  Administered 2015-05-06 (×2): 200 ug via INTRACORONARY

## 2015-05-06 MED ORDER — HEPARIN SODIUM (PORCINE) 1000 UNIT/ML IJ SOLN
INTRAMUSCULAR | Status: DC | PRN
  Administered 2015-05-06: 7000 [IU] via INTRAVENOUS
  Administered 2015-05-06: 3000 [IU] via INTRAVENOUS

## 2015-05-06 MED ORDER — VERAPAMIL HCL 2.5 MG/ML IV SOLN
INTRAVENOUS | Status: AC
  Filled 2015-05-06: qty 2

## 2015-05-06 MED ORDER — HEPARIN (PORCINE) IN NACL 2-0.9 UNIT/ML-% IJ SOLN
INTRAMUSCULAR | Status: AC
  Filled 2015-05-06: qty 1000

## 2015-05-06 MED ORDER — SODIUM CHLORIDE 0.9 % WEIGHT BASED INFUSION: 3.0000 mL/kg/h | INTRAVENOUS | Status: AC

## 2015-05-06 MED ORDER — SODIUM CHLORIDE 0.9% FLUSH
3.0000 mL | Freq: Two times a day (BID) | INTRAVENOUS | Status: DC
  Administered 2015-05-06: 13:00:00 3 mL via INTRAVENOUS

## 2015-05-06 MED ORDER — IOHEXOL 350 MG/ML SOLN
INTRAVENOUS | Status: DC | PRN
  Administered 2015-05-06: 125 mL via INTRA_ARTERIAL

## 2015-05-06 MED ORDER — FENTANYL CITRATE (PF) 100 MCG/2ML IJ SOLN
INTRAMUSCULAR | Status: DC | PRN
  Administered 2015-05-06: 25 ug via INTRAVENOUS

## 2015-05-06 MED ORDER — MIDAZOLAM HCL 2 MG/2ML IJ SOLN
INTRAMUSCULAR | Status: AC
  Filled 2015-05-06: qty 2

## 2015-05-06 MED ORDER — SODIUM CHLORIDE 0.9% FLUSH: 3.0000 mL | INTRAVENOUS | Status: DC | PRN

## 2015-05-06 MED ORDER — ONDANSETRON HCL 4 MG/2ML IJ SOLN: 4.0000 mg | Freq: Four times a day (QID) | INTRAMUSCULAR | Status: DC | PRN

## 2015-05-06 MED ORDER — IOPAMIDOL (ISOVUE-370) INJECTION 76%
INTRAVENOUS | Status: AC
  Filled 2015-05-06: qty 100

## 2015-05-06 MED ORDER — VERAPAMIL HCL 2.5 MG/ML IV SOLN
INTRAVENOUS | Status: DC | PRN
  Administered 2015-05-06: 09:00:00 via INTRA_ARTERIAL

## 2015-05-06 MED ORDER — SODIUM CHLORIDE 0.9 % IV SOLN
INTRAVENOUS | Status: DC
  Administered 2015-05-06: 07:00:00 via INTRAVENOUS

## 2015-05-06 MED ORDER — ADENOSINE 12 MG/4ML IV SOLN
12.0000 mL | Freq: Once | INTRAVENOUS | Status: DC
  Filled 2015-05-06: qty 12

## 2015-05-06 MED ORDER — ASPIRIN 81 MG PO CHEW: 81.0000 mg | CHEWABLE_TABLET | ORAL | Status: DC

## 2015-05-06 MED ORDER — IOPAMIDOL (ISOVUE-370) INJECTION 76%
INTRAVENOUS | Status: DC | PRN
  Administered 2015-05-06: 5 mL

## 2015-05-06 MED ORDER — HEPARIN (PORCINE) IN NACL 2-0.9 UNIT/ML-% IJ SOLN
INTRAMUSCULAR | Status: DC | PRN
  Administered 2015-05-06: 1500 mL

## 2015-05-06 MED ORDER — LISINOPRIL 10 MG PO TABS
10.0000 mg | ORAL_TABLET | Freq: Every day | ORAL | Status: DC
  Administered 2015-05-07: 10 mg via ORAL
  Filled 2015-05-06: qty 1

## 2015-05-06 MED ORDER — ACETAMINOPHEN 325 MG PO TABS: 650.0000 mg | ORAL_TABLET | ORAL | Status: DC | PRN

## 2015-05-06 MED ORDER — ATORVASTATIN CALCIUM 80 MG PO TABS
80.0000 mg | ORAL_TABLET | Freq: Every day | ORAL | Status: DC
  Administered 2015-05-06: 18:00:00 80 mg via ORAL
  Filled 2015-05-06: qty 1

## 2015-05-06 MED ORDER — PANTOPRAZOLE SODIUM 40 MG PO TBEC
40.0000 mg | DELAYED_RELEASE_TABLET | Freq: Every day | ORAL | Status: DC
  Administered 2015-05-06 – 2015-05-07 (×2): 40 mg via ORAL
  Filled 2015-05-06 (×2): qty 1

## 2015-05-06 MED ORDER — TICAGRELOR 90 MG PO TABS
90.0000 mg | ORAL_TABLET | Freq: Two times a day (BID) | ORAL | Status: DC
  Administered 2015-05-06 – 2015-05-07 (×2): 90 mg via ORAL
  Filled 2015-05-06 (×2): qty 1

## 2015-05-06 MED ORDER — NITROGLYCERIN 0.4 MG SL SUBL: 0.4000 mg | SUBLINGUAL_TABLET | SUBLINGUAL | Status: DC | PRN

## 2015-05-06 MED ORDER — LIDOCAINE HCL (PF) 1 % IJ SOLN
INTRAMUSCULAR | Status: DC | PRN
  Administered 2015-05-06: 3 mL

## 2015-05-06 MED ORDER — MIDAZOLAM HCL 2 MG/2ML IJ SOLN
INTRAMUSCULAR | Status: DC | PRN
  Administered 2015-05-06: 2 mg via INTRAVENOUS

## 2015-05-06 MED ORDER — FENTANYL CITRATE (PF) 100 MCG/2ML IJ SOLN
INTRAMUSCULAR | Status: AC
  Filled 2015-05-06: qty 2

## 2015-05-06 MED ORDER — LIDOCAINE HCL (PF) 1 % IJ SOLN
INTRAMUSCULAR | Status: AC
  Filled 2015-05-06: qty 30

## 2015-05-06 MED ORDER — ADENOSINE (DIAGNOSTIC) 140MCG/KG/MIN
INTRAVENOUS | Status: DC | PRN
  Administered 2015-05-06: 140 ug/kg/min via INTRAVENOUS

## 2015-05-06 MED ORDER — ANGIOPLASTY BOOK
Freq: Once | Status: AC
  Administered 2015-05-06: 22:00:00
  Filled 2015-05-06: qty 1

## 2015-05-06 MED ORDER — NITROGLYCERIN 1 MG/10 ML FOR IR/CATH LAB
INTRA_ARTERIAL | Status: AC
  Filled 2015-05-06: qty 10

## 2015-05-06 SURGICAL SUPPLY — 21 items
BALLN EUPHORA RX 2.5X15 (BALLOONS) ×4
BALLN ~~LOC~~ EUPHORA RX 3.0X12 (BALLOONS) ×4
BALLN ~~LOC~~ EUPHORA RX 3.0X15 (BALLOONS) ×4
BALLOON EUPHORA RX 2.5X15 (BALLOONS) ×2 IMPLANT
BALLOON ~~LOC~~ EUPHORA RX 3.0X12 (BALLOONS) ×2 IMPLANT
BALLOON ~~LOC~~ EUPHORA RX 3.0X15 (BALLOONS) ×2 IMPLANT
CATH INFINITI JR4 5F (CATHETERS) ×4 IMPLANT
CATH VISTA GUIDE 6FR XBLAD3.5 (CATHETERS) ×4 IMPLANT
GLIDESHEATH SLEND SS 6F .021 (SHEATH) ×4 IMPLANT
GUIDEWIRE PRESSURE COMET II (WIRE) ×4 IMPLANT
KIT ENCORE 26 ADVANTAGE (KITS) ×4 IMPLANT
KIT HEART LEFT (KITS) ×4 IMPLANT
PACK CARDIAC CATHETERIZATION (CUSTOM PROCEDURE TRAY) ×4 IMPLANT
STENT PROMUS PREM MR 3.0X12 (Permanent Stent) ×4 IMPLANT
STENT PROMUS PREM MR 3.0X20 (Permanent Stent) ×4 IMPLANT
TRANSDUCER W/STOPCOCK (MISCELLANEOUS) ×4 IMPLANT
TUBING CIL FLEX 10 FLL-RA (TUBING) ×4 IMPLANT
WIRE ASAHI PROWATER 180CM (WIRE) ×4 IMPLANT
WIRE HI TORQ BMW 190CM (WIRE) ×4 IMPLANT
WIRE HI TORQ VERSACORE-J 145CM (WIRE) ×4 IMPLANT
WIRE SAFE-T 1.5MM-J .035X260CM (WIRE) ×4 IMPLANT

## 2015-05-06 NOTE — Interval H&P Note (Signed)
History and Physical Interval Note:  05/06/2015 8:43 AM  Terry Rivers  has presented today for surgery, with the diagnosis of cad  The various methods of treatment have been discussed with the patient and family. After consideration of risks, benefits and other options for treatment, the patient has consented to  Procedure(s): Coronary Stent Intervention (N/A) as a surgical intervention .  The patient's history has been reviewed, patient examined, no change in status, stable for surgery.  I have reviewed the patient's chart and labs.  Questions were answered to the patient's satisfaction.   Cath Lab Visit (complete for each Cath Lab visit)  Clinical Evaluation Leading to the Procedure:   ACS: No.  Non-ACS:    Anginal Classification: CCS I  Anti-ischemic medical therapy: Minimal Therapy (1 class of medications)  Non-Invasive Test Results: No non-invasive testing performed  Prior CABG: No previous CABG        [name] [facility] 05/06/2015 8:44 AM

## 2015-05-06 NOTE — Progress Notes (Signed)
TR BAND REMOVAL  LOCATION:    right radial  DEFLATED PER PROTOCOL:    Yes.    TIME BAND OFF / DRESSING APPLIED:    1415   SITE UPON ARRIVAL:    Level 0  SITE AFTER BAND REMOVAL:    Level 0  CIRCULATION SENSATION AND MOVEMENT:    Within Normal Limits   Yes.    COMMENTS:   Tolerated procedure well, post TRB instructions given

## 2015-05-06 NOTE — H&P (View-Only) (Signed)
Cardiology Office Note:    Date:  04/24/2015   ID:  Terry Rivers, DOB 08/25/1950, MRN [id number]  PCP:  [name], MD  Cardiologist:  Dr. Peter [name]   Electrophysiologist:  n/a  Chief Complaint  Patient presents with  . Hospitalization Follow-up    s/p Inf STEMI >> PCI to RCA    History of Present Illness:     Terry Rivers is a 65 y.o. male with a hx of bradycardia, DM (diet controlled), DJD.    Admitted 2/28-3/2 with inferior STEMI.  LHC demonstrated multivessel CAD with occluded RCA as the culprit. RCA was treated with DES.  He has residual LAD/Dx and LCx disease that will needed staged PCI in several weeks.  He was not started on beta-blocker due to bradycardia.  Returns for FU.  Here with his wife. He is doing well. He has had a couple episodes of atypical chest pain. He denies any recurrent angina. He has been walking daily. Denies chest pain with exertion or DOE. Denies syncope, dizziness.  Denies orthopnea, PND, edema.  He has had occ episodes of dyspnea. No wheezing, cough.  No bleeding issues.  He has been adherent to dual antiplatelet Rx.   Past Medical History  Diagnosis Date  . Arthritis   . Chicken pox   . Frequent headaches   . Bradycardia     low 50"s per wife  . Diabetes mellitus without complication (HCC)     Borderline/no meds  . Tobacco use 04/17/2015  . Hyperlipidemia LDL goal <70 04/17/2015  . CAD, multiple vessel-native arteries, emergent PCI RCA with DES 04/15/15 04/17/2015    Past Surgical History  Procedure Laterality Date  . Skin cancer excision      right forearm/pre-cancerous/ 2 spots on left arm  . Cardiac catheterization N/A 04/15/2015    Procedure: Left Heart Cath and Coronary Angiography;  Surgeon: Peter M [name], MD;  Location: [facility];  Service: Cardiovascular;  Laterality: N/A;  . Cardiac catheterization N/A 04/15/2015    Procedure: Coronary Stent Intervention;  Surgeon: Peter M [name], MD;  Location: [facility];   Service: Cardiovascular;  Laterality: N/A;    Current Medications: Outpatient Prescriptions Prior to Visit  Medication Sig Dispense Refill  . acetaminophen (TYLENOL) 325 MG tablet Take 650 mg by mouth every 6 (six) hours as needed for mild pain.    Marland Kitchen aspirin 81 MG chewable tablet Chew 1 tablet (81 mg total) by mouth daily.    Marland Kitchen atorvastatin (LIPITOR) 80 MG tablet Take 1 tablet (80 mg total) by mouth daily at 6 PM. 30 tablet 6  . lisinopril (PRINIVIL,ZESTRIL) 10 MG tablet Take 1 tablet (10 mg total) by mouth daily. 30 tablet 6  . nitroGLYCERIN (NITROSTAT) 0.4 MG SL tablet Place 1 tablet (0.4 mg total) under the tongue every 5 (five) minutes x 3 doses as needed for chest pain. 25 tablet 4  . pantoprazole (PROTONIX) 40 MG tablet Take 1 tablet (40 mg total) by mouth daily. 30 tablet 6  . ticagrelor (BRILINTA) 90 MG TABS tablet Take 1 tablet (90 mg total) by mouth 2 (two) times daily. This is for 30 day free card 60 tablet 0   No facility-administered medications prior to visit.     Allergies:   Penicillins and Prednisone   Social History   Social History  . Marital Status: Married    Spouse Name: N/A  . Number of Children: N/A  . Years of Education: N/A  Social History Main Topics  . Smoking status: Never Smoker   . Smokeless tobacco: Current User    Types: Chew     Comment: chew every day  . Alcohol Use: No  . Drug Use: No  . Sexual Activity: Not Asked   Other Topics Concern  . None   Social History Narrative     Family History:  The patient's family history includes Brain cancer in his sister; Cancer in his brother and father; Colon cancer in his brother and father; Kidney cancer in his brother, sister, sister, sister, and sister; Stroke in his mother.   ROS:   Please see the history of present illness.    Review of Systems  Cardiovascular: Positive for chest pain.  Respiratory: Positive for shortness of breath.   Musculoskeletal: Positive for back pain.  All other  systems reviewed and are negative.   Physical Exam:    VS:  BP 114/80 mmHg  Pulse 63  Ht 5\' 5"  (1.651 m)  Wt 164 lb (74.39 kg)  BMI 27.29 kg/m2   GEN: Well nourished, well developed, in no acute distress HEENT: normal Neck: no JVD, no masses Cardiac: Normal S1/S2, RRR; no murmurs, no edema; right wrist without hematoma or mass    Respiratory:  clear to auscultation bilaterally; no wheezing, rhonchi or rales GI: soft, nontender,  MS: no deformity or atrophy Skin: warm and dry,  Neuro:    no focal deficits  Psych: Alert and oriented x 3, normal affect  Wt Readings from Last 3 Encounters:  04/24/15 164 lb (74.39 kg)  04/17/15 166 lb 11.2 oz (75.615 kg)  09/10/14 164 lb (74.39 kg)      Studies/Labs Reviewed:     EKG:  EKG is  ordered today.  The ekg ordered today demonstrates NSR, HR 63, TWI 2, 3, aVF, V4-6, QTc 386 ms, no change   Recent Labs: 04/15/2015: ALT 35; TSH 3.419 04/16/2015: BUN 7; Creatinine, Ser 0.87; Hemoglobin 12.5*; Platelets 188; Potassium 3.9; Sodium 141   Recent Lipid Panel    Component Value Date/Time   CHOL 155 04/16/2015 0507   TRIG 248* 04/16/2015 0507   HDL 20* 04/16/2015 0507   CHOLHDL 7.8 04/16/2015 0507   VLDL 50* 04/16/2015 0507   LDLCALC 85 04/16/2015 0507   LDLDIRECT 129.0 05/24/2014 0946    Additional studies/ records that were reviewed today include:   Echo 04/16/15 Mild LVH, EF 50-55%, no RWMA, Gr 1 DD, mild AI  LHC 04/15/15 LAD prox 70%, D1 75% LCx prox 80% RCA 100% PCI:  3.5 x 32 mm Synergy DES to pRCA 1. 3 vessel obstructive CAD 2. Culprit lesion in a large RCA 3. Normal LV EDP 4. Successful stenting of the proximal to mid RCA with a DES.  Plan: DAPT for one year. Will assess LV function with an Echo. Statin therapy. Avoid beta blocker given history of bradycardia. I would recommend staged PCI of the LCx, diagonal, and possibly the LAD (consider FFR). Given the complexity of disease I would favor doing this at a later date after  he has recovered from his acute infarct- Perhaps in a few weeks depending on clinical course.          ASSESSMENT:     1. CAD in native artery   2. Benign essential hypertension   3. Hyperlipidemia   4. Tobacco use     PLAN:     In order of problems listed above:  1. CAD - Patient presents for FU  s/p inf STEMI tx with DES to RCA.  He has residual LAD and LCx disease. Staged PCI has been recommended.  Patient is doing well without recurrent angina.  He has been adherent with his medications. I discussed his case with Dr. Peter [name] by phone today. We will set the patient up for staged PCI in the next 2 weeks.  -  Arrange Staged PCI with Dr. Peter [name] in 1-2 weeks.  -  Continue ASA, Brilinta, Lipitor 80, Lisinopril 10 QD  -  No beta blocker 2/2 hx of Bradycardia  -  BMET, CBC, INR today  2. HTN - BP controlled.   3. HL - Continue statin.  Lipids and LFTs 6 weeks.   4. Tobacco Abuse - I have advised him to stop using smokeless tobacco     Medication Adjustments/Labs and Tests Ordered: Current medicines are reviewed at length with the patient today.  Concerns regarding medicines are outlined above.  Medication changes, Labs and Tests ordered today are outlined in the Patient Instructions noted below. Patient Instructions  Medication Instructions:   Your physician recommends that you continue on your current medications as directed. Please refer to the Current Medication list given to you today.  If you need a refill on your cardiac medications before your next appointment, please call your pharmacy.  Labwork:  CBC BMET PT/INR  RETURN FOR LABS IN 6 WEEKS LIPIDS AND LFT   Testing/Procedures:  SEE LETTER FOR CATH 05/06/15  Follow-Up: WITH DR [name] FOR 2 WEEKS POST CATH   Any Other Special Instructions Will Be Listed Below (If Applicable).     Signed, Richardson Dopp, PA-C  04/24/2015 1:53 PM    [facility] [place], [place], [place]   [address] Phone: [phone]; Fax: [phone]

## 2015-05-07 ENCOUNTER — Encounter (HOSPITAL_COMMUNITY): Payer: Self-pay | Admitting: Student

## 2015-05-07 DIAGNOSIS — I10 Essential (primary) hypertension: Secondary | ICD-10-CM | POA: Diagnosis not present

## 2015-05-07 DIAGNOSIS — I2510 Atherosclerotic heart disease of native coronary artery without angina pectoris: Secondary | ICD-10-CM | POA: Diagnosis not present

## 2015-05-07 DIAGNOSIS — E785 Hyperlipidemia, unspecified: Secondary | ICD-10-CM | POA: Diagnosis not present

## 2015-05-07 DIAGNOSIS — I252 Old myocardial infarction: Secondary | ICD-10-CM | POA: Diagnosis not present

## 2015-05-07 DIAGNOSIS — Z7982 Long term (current) use of aspirin: Secondary | ICD-10-CM | POA: Diagnosis not present

## 2015-05-07 DIAGNOSIS — Z880 Allergy status to penicillin: Secondary | ICD-10-CM | POA: Diagnosis not present

## 2015-05-07 LAB — CBC
HEMATOCRIT: 39.8 % (ref 39.0–52.0)
HEMOGLOBIN: 13.8 g/dL (ref 13.0–17.0)
MCH: 31.3 pg (ref 26.0–34.0)
MCHC: 34.7 g/dL (ref 30.0–36.0)
MCV: 90.2 fL (ref 78.0–100.0)
Platelets: 230 10*3/uL (ref 150–400)
RBC: 4.41 MIL/uL (ref 4.22–5.81)
RDW: 12.7 % (ref 11.5–15.5)
WBC: 9.5 10*3/uL (ref 4.0–10.5)

## 2015-05-07 LAB — BASIC METABOLIC PANEL
Anion gap: 10 (ref 5–15)
BUN: 11 mg/dL (ref 6–20)
CALCIUM: 8.8 mg/dL — AB (ref 8.9–10.3)
CHLORIDE: 103 mmol/L (ref 101–111)
CO2: 25 mmol/L (ref 22–32)
Creatinine, Ser: 0.87 mg/dL (ref 0.61–1.24)
GFR calc non Af Amer: >60 mL/min (ref >60)
GLUCOSE: 103 mg/dL — AB (ref 65–99)
Potassium: 4.1 mmol/L (ref 3.5–5.1)
Sodium: 138 mmol/L (ref 135–145)

## 2015-05-07 LAB — GLUCOSE, CAPILLARY: Glucose-Capillary: 91 mg/dL (ref 65–99)

## 2015-05-07 NOTE — Care Management Note (Signed)
Case Management Note  Patient Details  Name: JAHZEEL HOGAN MRN: [id number] Date of Birth: [birth year]  Subjective/Objective:    65 y.o. M with Hx Inferior STEMI 2/28 admitted 05/06/2015 for staged PCI of the LCx                 Action/Plan: Anticipate discharge home today. No further CM needs but will be available should additional discharge needs arise.   Expected Discharge Date:                  Expected Discharge Plan:  Home/Self Care  In-House Referral:     Discharge planning Services  CM Consult  Post Acute Care Choice:    Choice offered to:     DME Arranged:    DME Agency:     HH Arranged:    [place] Agency:     Status of Service:  Completed, signed off  Medicare Important Message Given:    Date Medicare IM Given:    Medicare IM give by:    Date Additional Medicare IM Given:    Additional Medicare Important Message give by:     If discussed at [place] of Stay Meetings, dates discussed:    Additional Comments:  Delrae Sawyers, RN 05/07/2015, 9:20 AM

## 2015-05-07 NOTE — Progress Notes (Signed)
CARDIAC REHAB PHASE I   PRE:  Rate/Rhythm: 76 SR  BP:  Supine:   Sitting: 120/76  Standing:    SaO2:   MODE:  Ambulation: 1000 ft   POST:  Rate/Rhythm:   BP:  Supine:   Sitting: 130/80  Standing:    SaO2:  0910-0955 Pt tolerated ambulation well without c/o of cp or SOB. VS stable Pt to side of bed after walk with call light in reach. Reviewed PCI discharge education with pt. He had good recall with education provided last admission. Pt needed only a few reminders. He has already received a call from [place]. CRP, but he is interested in attending [place] program. Pt states that he and his wife come to [place] daily and it would work better for him to attend here. Referral sent to [place].  Rodney Langton RN 05/07/2015 9:49 AM

## 2015-05-07 NOTE — Discharge Summary (Signed)
Discharge Summary    Patient ID: Terry Rivers,  MRN: [id number], DOB/AGE: 09-09-1950 65 y.o.  Admit date: 05/06/2015 Discharge date: 05/07/2015  Primary Care Provider: [name] Primary Cardiologist: Dr. [name]  Discharge Diagnoses    Principal Problem:   CAD, multiple vessel-native arteries, emergent PCI RCA with DES 04/15/15 Active Problems:   Hyperlipidemia LDL goal <70   Benign essential HTN   CAD (coronary artery disease)   History of Present Illness     Terry Rivers is a 65 y.o. male with past medical history of CAD (04/15/2015 - inferior STEMI w/ occluded RCA, residual LAD and LCx disease), bradycardia, HTN, HLD, and Type 2 DM who presented to [facility] on  05/06/2015 for staged PCI of the LCx.   The patient was seen in the office on 04/23/2015 following his STEMI and staged PCI was recommended for his residual LAD and LCx disease. The risks and benefits of the procedure were discussed with the patient and he agreed to proceed.   Hospital Course     Consultants: None  He presented to [facility] on 05/06/2015 for the procedure. His catheterization showed 60% stenosis of the Prox to Mid LAD and 80% stenosis of the Prox to Mid Cx. Successful stenting of the proximal LCx with DES x 2 was performed. His LAD lesion showed a normal FFR of 0.9 indicating this lesion was not hemodynamically significant, therefore intervention was not performed. The full report is included below. No complications were noted during the case.  The following morning, he was doing well. Denied any chest pain, palpitations, or shortness of breath. Was able to ambulate around his room without difficulty.  His right radial cath site was without evidence of ecchymosis or a hematoma. His vitals and lab results were thoroughly reviewed.   He will continue on DAPT with ASA and Brilinta for one year. He reported having these medications currently available at home and not needing refills at this time. He  was last examined by Dr. Ron Parker and deemed stable for discharge. He will follow-up with Dr. [name] in 2 weeks.    Discharge Vitals:  Blood pressure 108/62, pulse 62, temperature 98.5 F (36.9 C), temperature source Oral, resp. rate 14, height 5\' 5"  (1.651 m), weight 164 lb 10.9 oz (74.7 kg), SpO2 98 %.  Filed Weights   05/06/15 0548 05/07/15 0622  Weight: 164 lb (74.39 kg) 164 lb 10.9 oz (74.7 kg)   Physical Exam:  General: Well developed, well nourished, male appearing in no acute distress. Head: Normocephalic, atraumatic.  Neck: Supple without bruits, JVD not elevated. Lungs:  Resp regular and unlabored, CTA without wheezing or rales. Heart: RRR, S1, S2, no S3, S4, or murmur; no rub. Abdomen: Soft, non-tender, non-distended with normoactive bowel sounds. No hepatomegaly. No rebound/guarding. No obvious abdominal masses. Extremities: No clubbing, cyanosis, or edema. Distal pedal pulses are 2+ bilaterally. Right radial cath site without evidence of ecchymosis or a hematoma. Neuro: Alert and oriented X 3. Moves all extremities spontaneously. Psych: Normal affect.  Labs & Radiologic Studies     CBC  Recent Labs  05/07/15 0657  WBC 9.5  HGB 13.8  HCT 39.8  MCV 90.2  PLT 123456   Basic Metabolic Panel No results for input(s): NA, K, CL, CO2, GLUCOSE, BUN, CREATININE, CALCIUM, MG, PHOS in the last 72 hours. Liver Function Tests No results for input(s): AST, ALT, ALKPHOS, BILITOT, PROT, ALBUMIN in the last 72 hours. No results for input(s): LIPASE,  AMYLASE in the last 72 hours. Cardiac Enzymes No results for input(s): CKTOTAL, CKMB, CKMBINDEX, TROPONINI in the last 72 hours.   Dg Chest 2 View: 04/30/2015  CLINICAL DATA:  Shortness of breath. Coronary stent placement last month. EXAM: CHEST  2 VIEW COMPARISON:  None. FINDINGS: The cardiomediastinal silhouette is within normal limits. The lungs are well inflated and clear. There is no evidence of pleural effusion or pneumothorax. No  acute osseous abnormality is identified. IMPRESSION: No active cardiopulmonary disease. Electronically Signed   By: Logan Bores M.D.   On: 04/30/2015 10:48    Diagnostic Studies/Procedures     Cardiac Catheterization: 05/06/2015  1st Diag lesion, 50% stenosed.  Prox Cx to Mid Cx lesion, 80% stenosed. Post intervention, there is a 0% residual stenosis.  Prox LAD to Mid LAD lesion, 60% stenosed.  1. Borderline LAD disease with normal FFR of 0.9 indicating that this is not hemodynamically significant. 2. Successful stenting of the proximal LCx with DES x 2.  Plan: anticipate DC in am. Continue DAPT for one year.    Echocardiogram:  Study Conclusions - Left ventricle: The cavity size was normal. There was mild  concentric hypertrophy. Systolic function was normal. The  estimated ejection fraction was in the range of 50% to 55%. Wall  motion was normal; there were no regional wall motion  abnormalities. Doppler parameters are consistent with abnormal  left ventricular relaxation (grade 1 diastolic dysfunction). - Aortic valve: Transvalvular velocity was within the normal range.  There was no stenosis. There was mild regurgitation. - Mitral valve: There was no regurgitation. - Right ventricle: The cavity size was normal. Wall thickness was  normal. Systolic function was normal. - Tricuspid valve: There was no regurgitation.  Disposition   Pt is being discharged home today in good condition.  Follow-up Plans & Appointments    Discharge Instructions    AMB Referral to Cardiac Rehabilitation - Phase II    Complete by:  As directed   Diagnosis:  PCI            Discharge Medications   Current Discharge Medication List    CONTINUE these medications which have NOT CHANGED   Details  acetaminophen (TYLENOL) 325 MG tablet Take 650 mg by mouth every 6 (six) hours as needed for mild pain.    aspirin 81 MG chewable tablet Chew 1 tablet (81 mg total) by mouth daily.      atorvastatin (LIPITOR) 80 MG tablet Take 1 tablet (80 mg total) by mouth daily at 6 PM. Qty: 30 tablet, Refills: 6    lisinopril (PRINIVIL,ZESTRIL) 10 MG tablet Take 1 tablet (10 mg total) by mouth daily. Qty: 30 tablet, Refills: 6    nitroGLYCERIN (NITROSTAT) 0.4 MG SL tablet Place 1 tablet (0.4 mg total) under the tongue every 5 (five) minutes x 3 doses as needed for chest pain. Qty: 25 tablet, Refills: 4    pantoprazole (PROTONIX) 40 MG tablet Take 1 tablet (40 mg total) by mouth daily. Qty: 30 tablet, Refills: 6    ticagrelor (BRILINTA) 90 MG TABS tablet Take 1 tablet (90 mg total) by mouth 2 (two) times daily. This is for 30 day free card Qty: 60 tablet, Refills: 11        Aspirin prescribed at discharge?  Yes High Intensity Statin Prescribed? (Lipitor 40-80mg  or Crestor 20-40mg ): Yes Beta Blocker Prescribed? No: History of Bradycardia For EF 45% or less, Was ACEI/ARB Prescribed? Yes ADP Receptor Inhibitor Prescribed? (i.e. Plavix etc.-Includes Medically Managed Patients):  Yes For EF <40%, Aldosterone Inhibitor Prescribed? No: Preserved EF Was EF assessed during THIS hospitalization? Yes - Catheterization Was Cardiac Rehab II ordered? (Included Medically managed Patients): Yes   Allergies Allergies  Allergen Reactions  . Penicillins     Outstanding Labs/Studies   None  Duration of Discharge Encounter   Greater than 30 minutes including physician time.  Signed, Erma Heritage, PA-C 05/07/2015, 7:42 AM Patient seen and examined. I agree with the assessment and plan as detailed above. See also my additional thoughts below.   The patient had a successful PCI yesterday. I spoke with him in the room this morning about his diet. He is ready for discharge. I made the decision for discharge. I agree with the discharge note as outlined completely above.  Dola Argyle, MD, [facility] 05/07/2015 8:20 AM

## 2015-05-26 ENCOUNTER — Encounter: Payer: Self-pay | Admitting: Cardiology

## 2015-05-26 ENCOUNTER — Ambulatory Visit (INDEPENDENT_AMBULATORY_CARE_PROVIDER_SITE_OTHER): Payer: Medicare Other | Admitting: Cardiology

## 2015-05-26 VITALS — BP 130/72 | HR 86 | Ht 65.0 in | Wt 157.2 lb

## 2015-05-26 DIAGNOSIS — I2111 ST elevation (STEMI) myocardial infarction involving right coronary artery: Secondary | ICD-10-CM | POA: Diagnosis not present

## 2015-05-26 DIAGNOSIS — I2510 Atherosclerotic heart disease of native coronary artery without angina pectoris: Secondary | ICD-10-CM | POA: Diagnosis not present

## 2015-05-26 DIAGNOSIS — E785 Hyperlipidemia, unspecified: Secondary | ICD-10-CM

## 2015-05-26 DIAGNOSIS — I10 Essential (primary) hypertension: Secondary | ICD-10-CM

## 2015-05-26 NOTE — Patient Instructions (Signed)
Continue your current therapy  I will see you in 4 months  

## 2015-05-27 NOTE — Progress Notes (Signed)
Cardiology Office Note:    Date:  05/27/2015   ID:  Terry Rivers, DOB Oct 24, 1950, MRN [id number]  PCP:  Terry Post, MD  Cardiologist:  Dr. Peter Rivers    Chief Complaint  Patient presents with  . Hospitalization Follow-up    no Chest pain, no edema, no pain or cramping in legs, no shortness of breath, no dizziness, no light headedness    History of Present Illness:     Terry Rivers is a 65 y.o. male with a hx of bradycardia, DM (diet controlled), DJD.    Admitted 2/28-3/2 with inferior STEMI.  LHC demonstrated multivessel CAD with occluded RCA as the culprit. RCA was treated with DES.  He has residual LAD/Dx and LCx disease. He was not started on beta-blocker due to bradycardia. On 05/06/15 he underwent staged PCI of the proximal to mid LCx with a DES. The mid LAD had a 60% stenosis with FFR of 0.9 so will be treated medically.  On follow up today he is doing well.   Here with his wife.  He has been walking daily. Denies chest pain with exertion or DOE. Denies syncope, dizziness.  Denies orthopnea, PND, edema.   No bleeding issues.  He has been adherent to dual antiplatelet Rx.   Past Medical History  Diagnosis Date  . Arthritis   . Chicken pox   . Frequent headaches   . Bradycardia     low 50"s per wife  . Diabetes mellitus without complication (HCC)     Borderline/no meds  . Tobacco use 04/17/2015  . Hyperlipidemia LDL goal <70 04/17/2015  . CAD, multiple vessel-native arteries, emergent PCI RCA with DES 04/15/15 2017    a. 03/2014- inferior STEMI with occluded RCA; b. 04/2015: Staged PCI of LCx  . Myocardial infarction ([place]) 03/2015  . Benign essential HTN     Past Surgical History  Procedure Laterality Date  . Skin cancer excision      right forearm/pre-cancerous/ 2 spots on left arm  . Cardiac catheterization N/A 04/15/2015    Procedure: Left Heart Cath and Coronary Angiography;  Surgeon: Terry M [name], MD;  Location: [place];  Service: Cardiovascular;   Laterality: N/A;  . Cardiac catheterization N/A 04/15/2015    Procedure: Coronary Stent Intervention;  Surgeon: Terry M [name], MD;  Location: [place] [facility];  Service: Cardiovascular;  Laterality: N/A;  . Coronary stent placement  05/06/2015    LT CIRC DES X2  . Colonoscopy    . Cardiac catheterization N/A 05/06/2015    Procedure: Coronary Stent Intervention;  Surgeon: Terry M [name], MD;  Location: [place] [facility];  Service: Cardiovascular;  Laterality: N/A;  . Cardiac catheterization  05/06/2015    Procedure: Coronary/Graft Angiography;  Surgeon: Terry M [name], MD;  Location: [place] [facility];  Service: Cardiovascular;;    Current Medications: Outpatient Prescriptions Prior to Visit  Medication Sig Dispense Refill  . acetaminophen (TYLENOL) 325 MG tablet Take 650 mg by mouth every 6 (six) hours as needed for mild pain.    Marland Kitchen aspirin 81 MG chewable tablet Chew 1 tablet (81 mg total) by mouth daily.    Marland Kitchen atorvastatin (LIPITOR) 80 MG tablet Take 1 tablet (80 mg total) by mouth daily at 6 PM. 30 tablet 6  . lisinopril (PRINIVIL,ZESTRIL) 10 MG tablet Take 1 tablet (10 mg total) by mouth daily. 30 tablet 6  . nitroGLYCERIN (NITROSTAT) 0.4 MG SL tablet Place 1 tablet (0.4 mg total) under the  tongue every 5 (five) minutes x 3 doses as needed for chest pain. 25 tablet 4  . pantoprazole (PROTONIX) 40 MG tablet Take 1 tablet (40 mg total) by mouth daily. 30 tablet 6  . ticagrelor (BRILINTA) 90 MG TABS tablet Take 1 tablet (90 mg total) by mouth 2 (two) times daily. This is for 30 day free card 60 tablet 11   No facility-administered medications prior to visit.     Allergies:   Penicillins and Prednisone   Social History   Social History  . Marital Status: Married    Spouse Name: N/A  . Number of Children: N/A  . Years of Education: N/A   Social History Main Topics  . Smoking status: Never Smoker   . Smokeless tobacco: Current User    Types: Chew     Comment: chew every day  .  Alcohol Use: No  . Drug Use: No  . Sexual Activity: Not Asked   Other Topics Concern  . None   Social History Narrative     Family History:  The patient's family history includes Brain cancer in his sister; Cancer in his brother and father; Colon cancer in his brother and father; Kidney cancer in his brother, sister, sister, sister, and sister; Stroke in his mother.   ROS:   Please see the history of present illness.    ROSAll other systems reviewed and are negative.   Physical Exam:    VS:  BP 130/72 mmHg  Pulse 86  Ht 5\' 5"  (1.651 m)  Wt 71.305 kg (157 lb 3.2 oz)  BMI 26.16 kg/m2  SpO2 96%   GEN: Well nourished, well developed, in no acute distress HEENT: normal Neck: no JVD, no masses Cardiac: Normal S1/S2, RRR; no murmurs, no edema; right wrist without hematoma Respiratory:  clear to auscultation bilaterally; no wheezing, rhonchi or rales GI: soft, nontender,  MS: no deformity or atrophy Skin: warm and dry,  Neuro:    no focal deficits  Psych: Alert and oriented x 3, normal affect  Wt Readings from Last 3 Encounters:  05/26/15 71.305 kg (157 lb 3.2 oz)  05/07/15 74.7 kg (164 lb 10.9 oz)  04/24/15 74.39 kg (164 lb)      Studies/Labs Reviewed:     EKG:  EKG is not  ordered today.     Recent Labs: 04/15/2015: ALT 35; TSH 3.419 05/07/2015: BUN 11; Creatinine, Ser 0.87; Hemoglobin 13.8; Platelets 230; Potassium 4.1; Sodium 138   Recent Lipid Panel    Component Value Date/Time   CHOL 155 04/16/2015 0507   TRIG 248* 04/16/2015 0507   HDL 20* 04/16/2015 0507   CHOLHDL 7.8 04/16/2015 0507   VLDL 50* 04/16/2015 0507   LDLCALC 85 04/16/2015 0507   LDLDIRECT 129.0 05/24/2014 0946    Additional studies/ records that were reviewed today include:       ASSESSMENT:     1. Coronary artery disease involving native coronary artery of native heart without angina pectoris   2. Benign essential HTN   3. Hyperlipidemia LDL goal <70     PLAN:     In order of  problems listed above:  1. CAD - s/p inf STEMI tx with DES to RCA.  Staged PCI of the proximal to mid LCx with DES. LAD disease with normal FFR. He has residual LAD and LCx disease. Currently asymptomatic. Will enroll in Cardiac Rehab. Continue DAPT for one year. No beta blocker due to history of bradycardia.    2. HTN -  BP controlled.   3. HL - Continue statin.  Lipids and LFTs 4 weeks  4. Tobacco Abuse -  stop using smokeless tobacco     Medication Adjustments/Labs and Tests Ordered: Current medicines are reviewed at length with the patient today.  Concerns regarding medicines are outlined above.  Medication changes, Labs and Tests ordered today are outlined in the Patient Instructions noted below. Patient Instructions  Continue your current therapy  I will see you in 4 months     Signed, Terry [name], MD  05/27/2015 7:03 AM    [place]

## 2015-06-03 ENCOUNTER — Other Ambulatory Visit: Payer: Medicare Other

## 2015-06-05 ENCOUNTER — Other Ambulatory Visit (INDEPENDENT_AMBULATORY_CARE_PROVIDER_SITE_OTHER): Payer: Medicare Other | Admitting: *Deleted

## 2015-06-05 DIAGNOSIS — I10 Essential (primary) hypertension: Secondary | ICD-10-CM | POA: Diagnosis not present

## 2015-06-05 DIAGNOSIS — I2510 Atherosclerotic heart disease of native coronary artery without angina pectoris: Secondary | ICD-10-CM

## 2015-06-05 LAB — HEPATIC FUNCTION PANEL
ALT: 27 U/L (ref 9–46)
AST: 22 U/L (ref 10–35)
Albumin: 4.1 g/dL (ref 3.6–5.1)
Alkaline Phosphatase: 53 U/L (ref 40–115)
BILIRUBIN DIRECT: 0.1 mg/dL (ref <=0.2)
BILIRUBIN TOTAL: 0.6 mg/dL (ref 0.2–1.2)
Indirect Bilirubin: 0.5 mg/dL (ref 0.2–1.2)
Total Protein: 7.3 g/dL (ref 6.1–8.1)

## 2015-06-05 LAB — LIPID PANEL
CHOL/HDL RATIO: 4.9 ratio (ref <=5.0)
Cholesterol: 78 mg/dL — ABNORMAL LOW (ref 125–200)
HDL: 16 mg/dL — ABNORMAL LOW (ref >=40)
LDL Cholesterol: 38 mg/dL (ref <130)
Triglycerides: 122 mg/dL (ref <150)
VLDL: 24 mg/dL (ref <30)

## 2015-06-05 NOTE — Addendum Note (Signed)
Addended by: Eulis Foster on: 06/05/2015 08:27 AM   Modules accepted: Orders

## 2015-06-06 ENCOUNTER — Telehealth: Payer: Self-pay | Admitting: Cardiology

## 2015-06-06 ENCOUNTER — Telehealth: Payer: Self-pay | Admitting: *Deleted

## 2015-06-06 DIAGNOSIS — I2510 Atherosclerotic heart disease of native coronary artery without angina pectoris: Secondary | ICD-10-CM

## 2015-06-06 DIAGNOSIS — E785 Hyperlipidemia, unspecified: Secondary | ICD-10-CM

## 2015-06-06 NOTE — Telephone Encounter (Signed)
°  Follow Up   Pts wife is calling to follow up on some forms dropped off for review about 2 weeks ago. Please call.

## 2015-06-06 NOTE — Telephone Encounter (Signed)
Returned call to wife.Advised I have forms completed.Advised I need to speak to billing to obtain CPT codes and I will obtain a copy of Dr.charges.Advised I will call her back on Tue 06/10/15.

## 2015-06-06 NOTE — Telephone Encounter (Signed)
DPR for wife on file who has been notified of pt's lab results by phone with verbal understanding. Repeat labs scheduled today for 08/13/15. Wife verbalized understanding to plan of care for pt.

## 2015-06-10 ENCOUNTER — Other Ambulatory Visit: Payer: Self-pay

## 2015-06-13 NOTE — Telephone Encounter (Signed)
Spoke to patient's wife 06/10/15 forms completed and left at front desk.

## 2015-07-24 ENCOUNTER — Telehealth: Payer: Self-pay | Admitting: Cardiology

## 2015-07-24 NOTE — Telephone Encounter (Signed)
Pt needs a 4 month appt due August- August and September full -pls call Mon-Wed-Fri likes am but will take pm   [phone]

## 2015-07-25 NOTE — Telephone Encounter (Signed)
Returned call to patient's wife.Follow up appointment scheduled with Dr.Jordan 09/25/15 at 10:00 am.

## 2015-09-12 ENCOUNTER — Other Ambulatory Visit: Payer: Medicare Other | Admitting: *Deleted

## 2015-09-12 ENCOUNTER — Telehealth: Payer: Self-pay | Admitting: *Deleted

## 2015-09-12 DIAGNOSIS — I2510 Atherosclerotic heart disease of native coronary artery without angina pectoris: Secondary | ICD-10-CM

## 2015-09-12 DIAGNOSIS — E785 Hyperlipidemia, unspecified: Secondary | ICD-10-CM | POA: Diagnosis not present

## 2015-09-12 LAB — HEPATIC FUNCTION PANEL
ALK PHOS: 43 U/L (ref 40–115)
ALT: 30 U/L (ref 9–46)
AST: 26 U/L (ref 10–35)
Albumin: 4.2 g/dL (ref 3.6–5.1)
BILIRUBIN DIRECT: 0.2 mg/dL (ref <=0.2)
BILIRUBIN INDIRECT: 0.4 mg/dL (ref 0.2–1.2)
Total Bilirubin: 0.6 mg/dL (ref 0.2–1.2)
Total Protein: 7.1 g/dL (ref 6.1–8.1)

## 2015-09-12 LAB — LIPID PANEL
CHOLESTEROL: 75 mg/dL — AB (ref 125–200)
HDL: 20 mg/dL — ABNORMAL LOW (ref >=40)
LDL Cholesterol: 33 mg/dL (ref <130)
TRIGLYCERIDES: 112 mg/dL (ref <150)
Total CHOL/HDL Ratio: 3.8 Ratio (ref <=5.0)
VLDL: 22 mg/dL (ref <30)

## 2015-09-12 NOTE — Telephone Encounter (Signed)
DPR for wife who has been notified of lab results for pt by phone with verbal understanding.

## 2015-09-24 NOTE — Progress Notes (Signed)
Cardiology Office Note:    Date:  09/25/2015   ID:  Terry Rivers, DOB May 28, 1950, MRN [id number]  PCP:  [name], MD  Cardiologist:  Dr. Jezebel Pollet [name]    Chief Complaint  Patient presents with  . Coronary Artery Disease  . Hypertension    History of Present Illness:     Terry Rivers is a 65 y.o. male with a hx of bradycardia, DM (diet controlled), DJD.    Admitted 2/28-04/17/15 with inferior STEMI.  LHC demonstrated multivessel CAD with occluded RCA as the culprit. RCA was treated with DES.  He has residual LAD/Dx and LCx disease. He was not started on beta-blocker due to bradycardia. On 05/06/15 he underwent staged PCI of the proximal to mid LCx with a DES. The mid LAD had a 60% stenosis with FFR of 0.9 so will be treated medically.  On follow up today he is doing well.   Here with his wife.  He has been walking daily. He is mowing over 5 acres.  Denies chest pain with exertion or DOE. Denies syncope, dizziness.  Denies orthopnea, PND, edema.   No bleeding issues.  He has been adherent to dual antiplatelet Rx. He has made dietary modifications and lost an additional 11 lbs. He did note some fatigue when working in garden in extreme heat.   Past Medical History:  Diagnosis Date  . Arthritis   . Benign essential HTN   . Bradycardia    low 50"s per wife  . CAD, multiple vessel-native arteries, emergent PCI RCA with DES 04/15/15 2017   a. 03/2014- inferior STEMI with occluded RCA; b. 04/2015: Staged PCI of LCx  . Chicken pox   . Diabetes mellitus without complication (HCC)    Borderline/no meds  . Frequent headaches   . Hyperlipidemia LDL goal <70 04/17/2015  . Myocardial infarction ([place]) 03/2015  . Tobacco use 04/17/2015    Past Surgical History:  Procedure Laterality Date  . CARDIAC CATHETERIZATION N/A 04/15/2015   Procedure: Left Heart Cath and Coronary Angiography;  Surgeon: Eilee Schader M [name], MD;  Location: [place] [facility];  Service: Cardiovascular;  Laterality: N/A;  .  CARDIAC CATHETERIZATION N/A 04/15/2015   Procedure: Coronary Stent Intervention;  Surgeon: Kye Silverstein M [name], MD;  Location: [place] [facility];  Service: Cardiovascular;  Laterality: N/A;  . CARDIAC CATHETERIZATION N/A 05/06/2015   Procedure: Coronary Stent Intervention;  Surgeon: Meia Emley M [name], MD;  Location: [place];  Service: Cardiovascular;  Laterality: N/A;  . CARDIAC CATHETERIZATION  05/06/2015   Procedure: Coronary/Graft Angiography;  Surgeon: Zellie Jenning M [name], MD;  Location: [facility];  Service: Cardiovascular;;  . COLONOSCOPY    . CORONARY STENT PLACEMENT  05/06/2015   LT CIRC DES X2  . SKIN CANCER EXCISION     right forearm/pre-cancerous/ 2 spots on left arm    Current Medications: Outpatient Medications Prior to Visit  Medication Sig Dispense Refill  . acetaminophen (TYLENOL) 325 MG tablet Take 650 mg by mouth every 6 (six) hours as needed for mild pain.    Marland Kitchen aspirin 81 MG chewable tablet Chew 1 tablet (81 mg total) by mouth daily.    . ticagrelor (BRILINTA) 90 MG TABS tablet Take 1 tablet (90 mg total) by mouth 2 (two) times daily. This is for 30 day free card 60 tablet 11  . atorvastatin (LIPITOR) 80 MG tablet Take 1 tablet (80 mg total) by mouth daily at 6 PM. 30 tablet 6  . lisinopril (  PRINIVIL,ZESTRIL) 10 MG tablet Take 1 tablet (10 mg total) by mouth daily. 30 tablet 6  . nitroGLYCERIN (NITROSTAT) 0.4 MG SL tablet Place 1 tablet (0.4 mg total) under the tongue every 5 (five) minutes x 3 doses as needed for chest pain. 25 tablet 4  . pantoprazole (PROTONIX) 40 MG tablet Take 1 tablet (40 mg total) by mouth daily. 30 tablet 6   No facility-administered medications prior to visit.      Allergies:   Penicillins and Prednisone   Social History   Social History  . Marital status: Married    Spouse name: N/A  . Number of children: N/A  . Years of education: N/A   Social History Main Topics  . Smoking status: Never Smoker  . Smokeless tobacco: Current  User    Types: Chew     Comment: chew every day  . Alcohol use No  . Drug use: No  . Sexual activity: Not Asked   Other Topics Concern  . None   Social History Narrative  . None     Family History:  The patient's family history includes Brain cancer in his sister; Cancer in his brother and father; Colon cancer in his brother and father; Kidney cancer in his brother, sister, sister, sister, and sister; Stroke in his mother.   ROS:   Please see the history of present illness.    ROSAll other systems reviewed and are negative.   Physical Exam:    VS:  BP 102/62   Pulse (!) 57   Ht 5\' 5"  (1.651 m)   Wt 146 lb (66.2 kg)   BMI 24.30 kg/m    GEN: Well nourished, well developed, in no acute distress  HEENT: normal  Neck: no JVD, no masses Cardiac: Normal S1/S2, RRR; no murmurs, no edema; right wrist without hematoma Respiratory:  clear to auscultation bilaterally; no wheezing, rhonchi or rales GI: soft, nontender,  MS: no deformity or atrophy  Skin: warm and dry,  Neuro:    no focal deficits  Psych: Alert and oriented x 3, normal affect  Wt Readings from Last 3 Encounters:  09/25/15 146 lb (66.2 kg)  05/26/15 157 lb 3.2 oz (71.3 kg)  05/07/15 164 lb 10.9 oz (74.7 kg)      Studies/Labs Reviewed:     EKG:  EKG is not  ordered today.     Recent Labs: 04/15/2015: TSH 3.419 05/07/2015: BUN 11; Creatinine, Ser 0.87; Hemoglobin 13.8; Platelets 230; Potassium 4.1; Sodium 138 09/12/2015: ALT 30   Recent Lipid Panel    Component Value Date/Time   CHOL 75 (L) 09/12/2015 0820   TRIG 112 09/12/2015 0820   HDL 20 (L) 09/12/2015 0820   CHOLHDL 3.8 09/12/2015 0820   VLDL 22 09/12/2015 0820   LDLCALC 33 09/12/2015 0820   LDLDIRECT 129.0 05/24/2014 0946    Additional studies/ records that were reviewed today include:       ASSESSMENT:     1. Hyperlipidemia LDL goal <70   2. CAD, multiple vessel-native arteries, emergent PCI RCA with DES 04/15/15   3. Benign essential  HTN     PLAN:     In order of problems listed above:  1. CAD - s/p inf STEMI in March 2017 tx with DES to RCA.  Staged PCI of the proximal to mid LCx with DES. LAD disease with normal FFR. He has residual LAD and LCx disease. Currently asymptomatic.  Continue DAPT for one year. No beta blocker due to history  of bradycardia.    2. HTN - BP controlled.   3. HL - Continue statin.  Lipids are excellent.  4. Tobacco Abuse -  Recommend stop using smokeless tobacco. He has cut back a lot.    Medication Adjustments/Labs and Tests Ordered: Current medicines are reviewed at length with the patient today.  Concerns regarding medicines are outlined above.  Medication changes, Labs and Tests ordered today are outlined in the Patient Instructions noted below. Patient Instructions  Continue your current therapy  I will see you in 6 months.    Signed, Idaly Verret [name], MD  09/25/2015 10:30 AM    [facility]

## 2015-09-25 ENCOUNTER — Encounter: Payer: Self-pay | Admitting: Cardiology

## 2015-09-25 ENCOUNTER — Ambulatory Visit (INDEPENDENT_AMBULATORY_CARE_PROVIDER_SITE_OTHER): Payer: Medicare Other | Admitting: Cardiology

## 2015-09-25 VITALS — BP 102/62 | HR 57 | Ht 65.0 in | Wt 146.0 lb

## 2015-09-25 DIAGNOSIS — I2510 Atherosclerotic heart disease of native coronary artery without angina pectoris: Secondary | ICD-10-CM

## 2015-09-25 DIAGNOSIS — E785 Hyperlipidemia, unspecified: Secondary | ICD-10-CM | POA: Diagnosis not present

## 2015-09-25 DIAGNOSIS — I10 Essential (primary) hypertension: Secondary | ICD-10-CM | POA: Diagnosis not present

## 2015-09-25 DIAGNOSIS — I2111 ST elevation (STEMI) myocardial infarction involving right coronary artery: Secondary | ICD-10-CM

## 2015-09-25 MED ORDER — PANTOPRAZOLE SODIUM 40 MG PO TBEC: 40.0000 mg | DELAYED_RELEASE_TABLET | Freq: Every day | ORAL | 6 refills | Status: DC

## 2015-09-25 MED ORDER — LISINOPRIL 10 MG PO TABS: 10.0000 mg | ORAL_TABLET | Freq: Every day | ORAL | 6 refills | Status: DC

## 2015-09-25 MED ORDER — NITROGLYCERIN 0.4 MG SL SUBL: 0.4000 mg | SUBLINGUAL_TABLET | SUBLINGUAL | 4 refills | Status: DC | PRN

## 2015-09-25 MED ORDER — ATORVASTATIN CALCIUM 80 MG PO TABS: 80.0000 mg | ORAL_TABLET | Freq: Every day | ORAL | 6 refills | Status: DC

## 2015-09-25 NOTE — Patient Instructions (Signed)
Continue your current therapy  I will see you in 6 months.   

## 2015-11-25 ENCOUNTER — Ambulatory Visit (INDEPENDENT_AMBULATORY_CARE_PROVIDER_SITE_OTHER): Payer: Medicare Other

## 2015-11-25 DIAGNOSIS — Z23 Encounter for immunization: Secondary | ICD-10-CM

## 2016-01-28 ENCOUNTER — Ambulatory Visit (INDEPENDENT_AMBULATORY_CARE_PROVIDER_SITE_OTHER): Payer: Medicare Other | Admitting: Family Medicine

## 2016-01-28 ENCOUNTER — Encounter: Payer: Self-pay | Admitting: Family Medicine

## 2016-01-28 VITALS — BP 110/80 | HR 59 | Temp 98.0°F | Ht 65.0 in | Wt 133.7 lb

## 2016-01-28 DIAGNOSIS — L570 Actinic keratosis: Secondary | ICD-10-CM | POA: Diagnosis not present

## 2016-01-28 DIAGNOSIS — Z0000 Encounter for general adult medical examination without abnormal findings: Secondary | ICD-10-CM | POA: Diagnosis not present

## 2016-01-28 DIAGNOSIS — Z23 Encounter for immunization: Secondary | ICD-10-CM

## 2016-01-28 DIAGNOSIS — I2111 ST elevation (STEMI) myocardial infarction involving right coronary artery: Secondary | ICD-10-CM | POA: Diagnosis not present

## 2016-01-28 DIAGNOSIS — Z125 Encounter for screening for malignant neoplasm of prostate: Secondary | ICD-10-CM | POA: Diagnosis not present

## 2016-01-28 DIAGNOSIS — R899 Unspecified abnormal finding in specimens from other organs, systems and tissues: Secondary | ICD-10-CM

## 2016-01-28 DIAGNOSIS — E785 Hyperlipidemia, unspecified: Secondary | ICD-10-CM

## 2016-01-28 DIAGNOSIS — I10 Essential (primary) hypertension: Secondary | ICD-10-CM | POA: Diagnosis not present

## 2016-01-28 LAB — PSA: PSA: 4.87 ng/mL — AB (ref 0.10–4.00)

## 2016-01-28 NOTE — Patient Instructions (Signed)
Health Maintenance  Topic Date Due  . Hepatitis C Screening  Mar 13, 1950  . FOOT EXAM  03/25/1958  . OPHTHALMOLOGY EXAM  03/25/1958  . ZOSTAVAX  03/25/2008  . PNA vac Low Risk Adult (2 of 2 - PPSV23) 05/24/2015  . HEMOGLOBIN A1C  10/17/2015  . COLONOSCOPY  09/05/2017  . TETANUS/TDAP  05/23/2024  . INFLUENZA VACCINE  Completed

## 2016-01-28 NOTE — Progress Notes (Signed)
Subjective:     Patient ID: Terry Rivers, male   DOB: April 28, 1950, 65 y.o.   MRN: [id number]  HPI Patient seen for Medicare wellness visit and for follow-up regarding medical items below. He has history of CAD and had PCI of RCA last year. He's made dramatic lifestyle changes especially with his diet. He has reduced saturated fats in a big way and has lost 31 pounds. He states he has good appetite. He stays very active with gardening. Nonsmoker. No recent chest pains. He remains on Lipitor, lisinopril, aspirin, Protonix, Brilinta  His medical record is recommending foot exam and ophthalmology exam as if he has diabetes but he has never been diagnosed with type 2 diabetes.  He needs Pneumovax. Had Prevnar last year. Already had flu vaccine and tetanus up-to-date. Will get repeat colonoscopy in 2 years (hx of polyps)  No recent falls. No depression issues.  Hx of actinic keratoses of forearms.  Spends much time outdoors in the sun.  No sunblock.   1.  Risk factors based on Past Medical , Social, and Family history reviewed and as indicated above with no changes 2.  Limitations in physical activities None.  No recent falls. 3.  Depression/mood No active depression or anxiety issues 4.  Hearing No deficits per patient. 5.  ADLs independent in all. 6.  Cognitive function (orientation to time and place, language, writing, speech,memory) no short or long term memory issues.  Language and judgement intact. 7.  Home Safety no issues 8.  Height, weight, and visual acuity.  Intentional weight loss as per HPI. 9.  Counseling discussed healthy diet.  Sun protection. 10. Recommendation of preventive services.  Pneumovax.  Continue annual flu vaccine.   11. Labs based on risk factors-PSA 12. Care Plan- as above. 45. Other Providers-Dr Peter [name], Cardiology. 14. Written schedule of screening/prevention services given to patient.   Past Medical History:  Diagnosis Date  . Arthritis   . Benign  essential HTN   . Bradycardia    low 50"s per wife  . CAD, multiple vessel-native arteries, emergent PCI RCA with DES 04/15/15 2017   a. 03/2014- inferior STEMI with occluded RCA; b. 04/2015: Staged PCI of LCx  . Chicken pox   . Diabetes mellitus without complication (HCC)    Borderline/no meds  . Frequent headaches   . Hyperlipidemia LDL goal <70 04/17/2015  . Myocardial infarction 03/2015  . Tobacco use 04/17/2015   Past Surgical History:  Procedure Laterality Date  . CARDIAC CATHETERIZATION N/A 04/15/2015   Procedure: Left Heart Cath and Coronary Angiography;  Surgeon: Peter M [name], MD;  Location: [facility];  Service: Cardiovascular;  Laterality: N/A;  . CARDIAC CATHETERIZATION N/A 04/15/2015   Procedure: Coronary Stent Intervention;  Surgeon: Peter M [name], MD;  Location: [facility];  Service: Cardiovascular;  Laterality: N/A;  . CARDIAC CATHETERIZATION N/A 05/06/2015   Procedure: Coronary Stent Intervention;  Surgeon: Peter M [name], MD;  Location: [facility];  Service: Cardiovascular;  Laterality: N/A;  . CARDIAC CATHETERIZATION  05/06/2015   Procedure: Coronary/Graft Angiography;  Surgeon: Peter M [name], MD;  Location: [facility];  Service: Cardiovascular;;  . COLONOSCOPY    . CORONARY STENT PLACEMENT  05/06/2015   LT CIRC DES X2  . SKIN CANCER EXCISION     right forearm/pre-cancerous/ 2 spots on left arm    reports that he has never smoked. His smokeless tobacco use includes Chew. He reports that he does not  drink alcohol or use drugs. family history includes Brain cancer in his sister; Cancer in his brother and father; Colon cancer in his brother and father; Kidney cancer in his brother, sister, sister, sister, and sister; Stroke in his mother.     Review of Systems  Constitutional: Negative for appetite change, chills, fatigue, fever and unexpected weight change.  Respiratory: Negative for shortness of breath.   Cardiovascular: Negative for  chest pain.  Gastrointestinal: Negative for abdominal pain.  Genitourinary: Negative for dysuria.  Neurological: Negative for dizziness.  Psychiatric/Behavioral: Negative for dysphoric mood.       Objective:   Physical Exam  Constitutional: He is oriented to person, place, and time. He appears well-developed and well-nourished.  HENT:  Mouth/Throat: Oropharynx is clear and moist.  Neck: Neck supple. No thyromegaly present.  Cardiovascular: Normal rate and regular rhythm.   Pulmonary/Chest: Effort normal and breath sounds normal. No respiratory distress. He has no wheezes.  Musculoskeletal: He exhibits no edema.  Lymphadenopathy:    He has no cervical adenopathy.  Neurological: He is alert and oriented to person, place, and time. No cranial nerve deficit.  Skin:  Patient has multiple hyperkeratotic whitish lesions on both forearms as well as left temporal region. No ulceration.       Assessment:     #1 Medicare subsequent annual wellness visit. Needs Pneumovax. Other immunizations are up-to-date. Will need repeat colonoscopy in a couple years  #2 multiple actinic keratoses on extremities and face  #3 history of CAD with previous PCI of RCA.  No recent chest pain.  #4 intentional weight loss due to dietary changes    Plan:     -The natural history of prostate cancer and ongoing controversy regarding screening and potential treatment outcomes of prostate cancer has been discussed with the patient. The meaning of a false positive PSA and a false negative PSA has been discussed. He indicates understanding of the limitations of this screening test and wishes to proceed with screening PSA testing. -We discussed risk and benefits of cryotherapy to actinic keratoses lesions and patient consents. We treated total of 3 lesions left forearm and 3 on the right forearm and one left temporal region. Patient tolerated well. -We discussed the importance of sun protection with sunblock and using  a cap/hat at all times when outdoors -Continue low saturated fat diet. He's had recent lipids per cardiology  [name] MD [facility] at [facility]

## 2016-01-28 NOTE — Progress Notes (Signed)
Pre visit review using our clinic review tool, if applicable. No additional management support is needed unless otherwise documented below in the visit note. 

## 2016-01-29 NOTE — Addendum Note (Signed)
Addended by: Agnes Lawrence on: 01/29/2016 08:33 AM   Modules accepted: Orders

## 2016-03-24 NOTE — Progress Notes (Signed)
Cardiology Office Note:    Date:  03/26/2016   ID:  Terry Rivers, DOB June 04, 1950, MRN [id number]  PCP:  [name], MD  Cardiologist:  Dr. Holley Wirt [name]    Chief Complaint  Patient presents with  . Coronary Artery Disease    History of Present Illness:     Terry Rivers is a 66 y.o. male with a hx of bradycardia, DM (diet controlled), DJD.    Admitted 2/28-04/17/15 with inferior STEMI.  LHC demonstrated multivessel CAD with occluded RCA as the culprit. RCA was treated with DES.  He has residual LAD/Dx and LCx disease. He was not started on beta-blocker due to bradycardia. On 05/06/15 he underwent staged PCI of the proximal to mid LCx with a DES. The mid LAD had a 60% stenosis with FFR of 0.9 so will be treated medically.  On follow up today he is doing well.   Here with his wife.  He has been walking daily. He is very active. Denies chest pain with exertion or DOE. Denies syncope, dizziness.  Denies orthopnea, PND, edema.   No bleeding issues.  He has been adherent to dual antiplatelet Rx.    Past Medical History:  Diagnosis Date  . Arthritis   . Benign essential HTN   . Bradycardia    low 50"s per wife  . CAD, multiple vessel-native arteries, emergent PCI RCA with DES 04/15/15 2017   a. 03/2014- inferior STEMI with occluded RCA; b. 04/2015: Staged PCI of LCx  . Chicken pox   . Diabetes mellitus without complication (HCC)    Borderline/no meds  . Frequent headaches   . Hyperlipidemia LDL goal <70 04/17/2015  . Myocardial infarction 03/2015  . Tobacco use 04/17/2015    Past Surgical History:  Procedure Laterality Date  . CARDIAC CATHETERIZATION N/A 04/15/2015   Procedure: Left Heart Cath and Coronary Angiography;  Surgeon: Romar Woodrick M [name], MD;  Location: [facility];  Service: Cardiovascular;  Laterality: N/A;  . CARDIAC CATHETERIZATION N/A 04/15/2015   Procedure: Coronary Stent Intervention;  Surgeon: Ariel Dimitri M [name], MD;  Location: [facility];  Service:  Cardiovascular;  Laterality: N/A;  . CARDIAC CATHETERIZATION N/A 05/06/2015   Procedure: Coronary Stent Intervention;  Surgeon: Tylyn Derwin M [name], MD;  Location: [facility];  Service: Cardiovascular;  Laterality: N/A;  . CARDIAC CATHETERIZATION  05/06/2015   Procedure: Coronary/Graft Angiography;  Surgeon: Hernandez Losasso M [name], MD;  Location: [facility];  Service: Cardiovascular;;  . COLONOSCOPY    . CORONARY STENT PLACEMENT  05/06/2015   LT CIRC DES X2  . SKIN CANCER EXCISION     right forearm/pre-cancerous/ 2 spots on left arm    Current Medications: Outpatient Medications Prior to Visit  Medication Sig Dispense Refill  . acetaminophen (TYLENOL) 325 MG tablet Take 650 mg by mouth every 6 (six) hours as needed for mild pain.    Marland Kitchen aspirin 81 MG chewable tablet Chew 1 tablet (81 mg total) by mouth daily.    Marland Kitchen atorvastatin (LIPITOR) 80 MG tablet Take 1 tablet (80 mg total) by mouth daily at 6 PM. 30 tablet 6  . lisinopril (PRINIVIL,ZESTRIL) 10 MG tablet Take 1 tablet (10 mg total) by mouth daily. 30 tablet 6  . nitroGLYCERIN (NITROSTAT) 0.4 MG SL tablet Place 1 tablet (0.4 mg total) under the tongue every 5 (five) minutes x 3 doses as needed for chest pain. 25 tablet 4  . pantoprazole (PROTONIX) 40 MG tablet Take 1 tablet (40  mg total) by mouth daily. 30 tablet 6  . ticagrelor (BRILINTA) 90 MG TABS tablet Take 1 tablet (90 mg total) by mouth 2 (two) times daily. This is for 30 day free card 60 tablet 11   No facility-administered medications prior to visit.      Allergies:   Penicillins and Prednisone   Social History   Social History  . Marital status: Married    Spouse name: N/A  . Number of children: N/A  . Years of education: N/A   Social History Main Topics  . Smoking status: Never Smoker  . Smokeless tobacco: Current User    Types: Chew     Comment: chew every day  . Alcohol use No  . Drug use: No  . Sexual activity: Not Asked   Other Topics Concern  . None    Social History Narrative  . None     Family History:  The patient's family history includes Brain cancer in his sister; Cancer in his brother and father; Colon cancer in his brother and father; Kidney cancer in his brother, sister, sister, sister, and sister; Stroke in his mother.   ROS:   Please see the history of present illness.    ROSAll other systems reviewed and are negative.   Physical Exam:    VS:  BP 114/70   Pulse (!) 58   Ht 5\' 5"  (1.651 m)   Wt 135 lb 9.6 oz (61.5 kg)   BMI 22.57 kg/m    GEN: Well nourished, well developed, in no acute distress  HEENT: normal  Neck: no JVD, no masses Cardiac: Normal S1/S2, RRR; no murmurs, no edema; right wrist without hematoma Respiratory:  clear to auscultation bilaterally; no wheezing, rhonchi or rales GI: soft, nontender,  MS: no deformity or atrophy  Skin: warm and dry,  Neuro:    no focal deficits  Psych: Alert and oriented x 3, normal affect  Wt Readings from Last 3 Encounters:  03/26/16 135 lb 9.6 oz (61.5 kg)  01/28/16 133 lb 11.2 oz (60.6 kg)  09/25/15 146 lb (66.2 kg)      Studies/Labs Reviewed:     EKG:  EKG is  ordered today.  NSR with nonspecific TWA. I have personally reviewed and interpreted this study.    Recent Labs: 04/15/2015: TSH 3.419 05/07/2015: BUN 11; Creatinine, Ser 0.87; Hemoglobin 13.8; Platelets 230; Potassium 4.1; Sodium 138 09/12/2015: ALT 30   Recent Lipid Panel    Component Value Date/Time   CHOL 75 (L) 09/12/2015 0820   TRIG 112 09/12/2015 0820   HDL 20 (L) 09/12/2015 0820   CHOLHDL 3.8 09/12/2015 0820   VLDL 22 09/12/2015 0820   LDLCALC 33 09/12/2015 0820   LDLDIRECT 129.0 05/24/2014 0946    Additional studies/ records that were reviewed today include:  None     ASSESSMENT:     1. Hyperlipidemia LDL goal <70   2. CAD, multiple vessel-native arteries, emergent PCI RCA with DES 04/15/15   3. Benign essential HTN   4. CAD in native artery     PLAN:     In order of  problems listed above:  1. CAD - s/p inf STEMI in March 2017 tx with DES to RCA.  Staged PCI of the proximal to mid LCx with DES. LAD disease with normal FFR. He has residual LAD and LCx disease. Currently asymptomatic.  He may stop taking Brilinta at the end of this month and continue ASA 81 mg daily.  No beta  blocker due to history of bradycardia.    2. HTN - BP controlled.   3. HL - Continue statin.  Lipids are excellent.  4. Tobacco Abuse -  Recommend stop using smokeless tobacco.     Medication Adjustments/Labs and Tests Ordered: Current medicines are reviewed at length with the patient today.  Concerns regarding medicines are outlined above.  Medication changes, Labs and Tests ordered today are outlined in the Patient Instructions noted below. Patient Instructions  Discontinue Brilinta  At the end of this month  Continue your other therapy  I will see you in 6 months  Signed, Cordelle Dahmen [name], MD  03/26/2016 9:24 AM     [facility]

## 2016-03-26 ENCOUNTER — Ambulatory Visit (INDEPENDENT_AMBULATORY_CARE_PROVIDER_SITE_OTHER): Payer: Medicare Other | Admitting: Cardiology

## 2016-03-26 ENCOUNTER — Encounter: Payer: Self-pay | Admitting: Cardiology

## 2016-03-26 VITALS — BP 114/70 | HR 58 | Ht 65.0 in | Wt 135.6 lb

## 2016-03-26 DIAGNOSIS — E785 Hyperlipidemia, unspecified: Secondary | ICD-10-CM

## 2016-03-26 DIAGNOSIS — I10 Essential (primary) hypertension: Secondary | ICD-10-CM | POA: Diagnosis not present

## 2016-03-26 DIAGNOSIS — I2510 Atherosclerotic heart disease of native coronary artery without angina pectoris: Secondary | ICD-10-CM

## 2016-03-26 NOTE — Patient Instructions (Addendum)
Discontinue Brilinta  At the end of this month  Continue your other therapy  I will see you in 6 months

## 2016-06-03 ENCOUNTER — Other Ambulatory Visit: Payer: Self-pay | Admitting: Cardiology

## 2016-08-25 ENCOUNTER — Other Ambulatory Visit (INDEPENDENT_AMBULATORY_CARE_PROVIDER_SITE_OTHER): Payer: Medicare Other

## 2016-08-25 DIAGNOSIS — R899 Unspecified abnormal finding in specimens from other organs, systems and tissues: Secondary | ICD-10-CM | POA: Diagnosis not present

## 2016-08-25 DIAGNOSIS — R972 Elevated prostate specific antigen [PSA]: Secondary | ICD-10-CM | POA: Diagnosis not present

## 2016-08-25 LAB — PSA: PSA: 2.44 ng/mL (ref 0.10–4.00)

## 2016-09-26 NOTE — Progress Notes (Signed)
Cardiology Office Note:    Date:  09/27/2016   ID:  Terry Rivers, DOB [birth year], MRN [id number]  PCP:  [name], MD  Cardiologist:  Dr. Peter [name]    Chief Complaint  Patient presents with  . Follow-up    pt has no complaints today  . Coronary Artery Disease    History of Present Illness:     Terry Rivers is a 66 y.o. male with a hx of CAD, bradycardia, DM (diet controlled), DJD.    Admitted 2/28-04/17/15 with inferior STEMI.  LHC demonstrated multivessel CAD with occluded RCA as the culprit. RCA was treated with DES.  He has residual LAD/Dx and LCx disease. He was not started on beta-blocker due to bradycardia. On 05/06/15 he underwent staged PCI of the proximal to mid LCx with a DES. The mid LAD had a 60% stenosis with FFR of 0.9 so will be treated medically.  On follow up today he is doing well.    He has been walking daily. He is very active doing yard and gardening work. Denies chest pain with exertion or DOE. Denies syncope, dizziness.  Denies orthopnea, PND, edema.   His wife notes pulse rate in 50s but his energy is very good.   Past Medical History:  Diagnosis Date  . Arthritis   . Benign essential HTN   . Bradycardia    low 50"s per wife  . CAD, multiple vessel-native arteries, emergent PCI RCA with DES 04/15/15 2017   a. 03/2014- inferior STEMI with occluded RCA; b. 04/2015: Staged PCI of LCx  . Chicken pox   . Diabetes mellitus without complication (HCC)    Borderline/no meds  . Frequent headaches   . Hyperlipidemia LDL goal <70 04/17/2015  . Myocardial infarction ([place]) 03/2015  . Tobacco use 04/17/2015    Past Surgical History:  Procedure Laterality Date  . CARDIAC CATHETERIZATION N/A 04/15/2015   Procedure: Left Heart Cath and Coronary Angiography;  Surgeon: Peter M [name], MD;  Location: [facility];  Service: Cardiovascular;  Laterality: N/A;  . CARDIAC CATHETERIZATION N/A 04/15/2015   Procedure: Coronary Stent Intervention;  Surgeon: Peter M  [name], MD;  Location: [facility];  Service: Cardiovascular;  Laterality: N/A;  . CARDIAC CATHETERIZATION N/A 05/06/2015   Procedure: Coronary Stent Intervention;  Surgeon: Peter M [name], MD;  Location: [facility];  Service: Cardiovascular;  Laterality: N/A;  . CARDIAC CATHETERIZATION  05/06/2015   Procedure: Coronary/Graft Angiography;  Surgeon: Peter M [name], MD;  Location: [facility];  Service: Cardiovascular;;  . COLONOSCOPY    . CORONARY STENT PLACEMENT  05/06/2015   LT CIRC DES X2  . SKIN CANCER EXCISION     right forearm/pre-cancerous/ 2 spots on left arm    Current Medications: Outpatient Medications Prior to Visit  Medication Sig Dispense Refill  . acetaminophen (TYLENOL) 325 MG tablet Take 650 mg by mouth every 6 (six) hours as needed for mild pain.    Marland Kitchen aspirin 81 MG chewable tablet Chew 1 tablet (81 mg total) by mouth daily.    Marland Kitchen atorvastatin (LIPITOR) 80 MG tablet TAKE 1 TABLET (80 MG TOTAL) BY MOUTH DAILY AT 6 PM. 30 tablet 9  . lisinopril (PRINIVIL,ZESTRIL) 10 MG tablet TAKE 1 TABLET BY MOUTH DAILY. 30 tablet 9  . nitroGLYCERIN (NITROSTAT) 0.4 MG SL tablet Place 1 tablet (0.4 mg total) under the tongue every 5 (five) minutes x 3 doses as needed for chest pain. 25 tablet  4  . pantoprazole (PROTONIX) 40 MG tablet TAKE 1 TABLET (40 MG TOTAL) BY MOUTH DAILY. 30 tablet 9   No facility-administered medications prior to visit.      Allergies:   Penicillins and Prednisone   Social History   Social History  . Marital status: Married    Spouse name: N/A  . Number of children: N/A  . Years of education: N/A   Social History Main Topics  . Smoking status: Never Smoker  . Smokeless tobacco: Current User    Types: Chew     Comment: chew every day  . Alcohol use No  . Drug use: No  . Sexual activity: Not Asked   Other Topics Concern  . None   Social History Narrative  . None     Family History:  The patient's family history includes Brain  cancer in his sister; Cancer in his brother and father; Colon cancer in his brother and father; Kidney cancer in his brother, sister, sister, sister, and sister; Stroke in his mother.   ROS:   Please see the history of present illness.    ROSAll other systems reviewed and are negative.   Physical Exam:    VS:  BP (!) 130/58 (BP Location: Right Arm, Patient Position: Sitting, Cuff Size: Normal)   Pulse (!) 50   Ht 5\' 5"  (1.651 m)   Wt 141 lb 12.8 oz (64.3 kg)   BMI 23.60 kg/m    GENERAL:  Well appearing HEENT:  PERRL, EOMI, sclera are clear. Oropharynx is clear. NECK:  No jugular venous distention, carotid upstroke brisk and symmetric, no bruits, no thyromegaly or adenopathy LUNGS:  Clear to auscultation bilaterally CHEST:  Unremarkable HEART:  RRR,  PMI not displaced or sustained,S1 and S2 within normal limits, no S3, no S4: no clicks, no rubs, no murmurs ABD:  Soft, nontender. BS +, no masses or bruits. No hepatomegaly, no splenomegaly EXT:  2 + pulses throughout, no edema, no cyanosis no clubbing SKIN:  Warm and dry.  No rashes NEURO:  Alert and oriented x 3. Cranial nerves II through XII intact. PSYCH:  Cognitively intact    Wt Readings from Last 3 Encounters:  09/27/16 141 lb 12.8 oz (64.3 kg)  03/26/16 135 lb 9.6 oz (61.5 kg)  01/28/16 133 lb 11.2 oz (60.6 kg)      Studies/Labs Reviewed:     EKG:  EKG is not ordered today. .    Recent Labs: No results found for requested labs within last 8760 hours.   Recent Lipid Panel    Component Value Date/Time   CHOL 75 (L) 09/12/2015 0820   TRIG 112 09/12/2015 0820   HDL 20 (L) 09/12/2015 0820   CHOLHDL 3.8 09/12/2015 0820   VLDL 22 09/12/2015 0820   LDLCALC 33 09/12/2015 0820   LDLDIRECT 129.0 05/24/2014 0946    Additional studies/ records that were reviewed today include:  None     ASSESSMENT:     1. CAD in native artery   2. Pure hypercholesterolemia   3. Benign essential hypertension     PLAN:      In order of problems listed above:  1. CAD - s/p inf STEMI in March 2017 tx with DES to RCA.  Staged PCI of the proximal to mid LCx with DES. LAD disease with normal FFR. He has residual LAD and LCx disease. Currently asymptomatic.  Continue ASA and statin.   No beta blocker due to history of bradycardia.  2. HTN - BP controlled on lisinopril. Check CMET today.  3. HL - Continue statin.  Check fasting labs today.  4. Tobacco Abuse -  Recommend stop using smokeless tobacco.     Medication Adjustments/Labs and Tests Ordered: Current medicines are reviewed at length with the patient today.  Concerns regarding medicines are outlined above.  Medication changes, Labs and Tests ordered today are outlined in the Patient Instructions noted below. Patient Instructions  Continue your current therapy  We will check blood work today  I will follow up in on year.  Signed, Peter [name], MD  09/27/2016 8:13 AM    [facility]

## 2016-09-27 ENCOUNTER — Ambulatory Visit (INDEPENDENT_AMBULATORY_CARE_PROVIDER_SITE_OTHER): Payer: Medicare Other | Admitting: Cardiology

## 2016-09-27 ENCOUNTER — Encounter: Payer: Self-pay | Admitting: Cardiology

## 2016-09-27 VITALS — BP 130/58 | HR 50 | Ht 65.0 in | Wt 141.8 lb

## 2016-09-27 DIAGNOSIS — I10 Essential (primary) hypertension: Secondary | ICD-10-CM | POA: Diagnosis not present

## 2016-09-27 DIAGNOSIS — I2510 Atherosclerotic heart disease of native coronary artery without angina pectoris: Secondary | ICD-10-CM | POA: Diagnosis not present

## 2016-09-27 DIAGNOSIS — E7800 Pure hypercholesterolemia, unspecified: Secondary | ICD-10-CM | POA: Diagnosis not present

## 2016-09-27 LAB — LIPID PANEL W/O CHOL/HDL RATIO
Cholesterol, Total: 103 mg/dL (ref 100–199)
HDL: 28 mg/dL — ABNORMAL LOW (ref >39)
LDL CALC: 56 mg/dL (ref 0–99)
Triglycerides: 93 mg/dL (ref 0–149)
VLDL Cholesterol Cal: 19 mg/dL (ref 5–40)

## 2016-09-27 LAB — BASIC METABOLIC PANEL
BUN / CREAT RATIO: 10 (ref 10–24)
BUN: 7 mg/dL — ABNORMAL LOW (ref 8–27)
CO2: 24 mmol/L (ref 20–29)
CREATININE: 0.68 mg/dL — AB (ref 0.76–1.27)
Calcium: 8.7 mg/dL (ref 8.6–10.2)
Chloride: 108 mmol/L — ABNORMAL HIGH (ref 96–106)
GFR, EST AFRICAN AMERICAN: 113 mL/min/{1.73_m2} (ref >59)
GFR, EST NON AFRICAN AMERICAN: 98 mL/min/{1.73_m2} (ref >59)
Glucose: 77 mg/dL (ref 65–99)
POTASSIUM: 4.3 mmol/L (ref 3.5–5.2)
SODIUM: 146 mmol/L — AB (ref 134–144)

## 2016-09-27 LAB — HEPATIC FUNCTION PANEL
ALBUMIN: 4.1 g/dL (ref 3.6–4.8)
ALK PHOS: 47 IU/L (ref 39–117)
ALT: 23 IU/L (ref 0–44)
AST: 18 IU/L (ref 0–40)
BILIRUBIN, DIRECT: 0.12 mg/dL (ref 0.00–0.40)
Bilirubin Total: 0.3 mg/dL (ref 0.0–1.2)
TOTAL PROTEIN: 6.6 g/dL (ref 6.0–8.5)

## 2016-09-27 NOTE — Patient Instructions (Signed)
Continue your current therapy  We will check blood work today  I will follow up in on year.

## 2016-10-19 ENCOUNTER — Ambulatory Visit (INDEPENDENT_AMBULATORY_CARE_PROVIDER_SITE_OTHER): Payer: Medicare Other | Admitting: Family Medicine

## 2016-10-19 ENCOUNTER — Encounter: Payer: Self-pay | Admitting: Family Medicine

## 2016-10-19 VITALS — BP 110/78 | HR 54 | Temp 98.7°F | Wt 139.0 lb

## 2016-10-19 DIAGNOSIS — I2510 Atherosclerotic heart disease of native coronary artery without angina pectoris: Secondary | ICD-10-CM

## 2016-10-19 DIAGNOSIS — M26621 Arthralgia of right temporomandibular joint: Secondary | ICD-10-CM

## 2016-10-19 DIAGNOSIS — H9201 Otalgia, right ear: Secondary | ICD-10-CM

## 2016-10-19 DIAGNOSIS — Z23 Encounter for immunization: Secondary | ICD-10-CM | POA: Diagnosis not present

## 2016-10-19 NOTE — Progress Notes (Signed)
Subjective:     Patient ID: Terry Rivers, male   DOB: [birth year], 66 y.o.   MRN: [id number]  HPI   Patient seen with right "ear" pain. He's no pain for about the past week. He states pain sometimes radiates down toward the neck region. No chest pain. No dyspnea. No rash. No hearing changes. No drainage. Denies any intraoral pain. Does note pain sometimes with chewing. No history of TMJ issues.  Past Medical History:  Diagnosis Date  . Arthritis   . Benign essential HTN   . Bradycardia    low 50"s per wife  . CAD, multiple vessel-native arteries, emergent PCI RCA with DES 04/15/15 2017   a. 03/2014- inferior STEMI with occluded RCA; b. 04/2015: Staged PCI of LCx  . Chicken pox   . Diabetes mellitus without complication (HCC)    Borderline/no meds  . Frequent headaches   . Hyperlipidemia LDL goal <70 04/17/2015  . Myocardial infarction ([place]) 03/2015  . Tobacco use 04/17/2015   Past Surgical History:  Procedure Laterality Date  . CARDIAC CATHETERIZATION N/A 04/15/2015   Procedure: Left Heart Cath and Coronary Angiography;  Surgeon: Peter M [name], MD;  Location: [facility];  Service: Cardiovascular;  Laterality: N/A;  . CARDIAC CATHETERIZATION N/A 04/15/2015   Procedure: Coronary Stent Intervention;  Surgeon: Peter M [name], MD;  Location: [facility];  Service: Cardiovascular;  Laterality: N/A;  . CARDIAC CATHETERIZATION N/A 05/06/2015   Procedure: Coronary Stent Intervention;  Surgeon: Peter M [name], MD;  Location: [facility];  Service: Cardiovascular;  Laterality: N/A;  . CARDIAC CATHETERIZATION  05/06/2015   Procedure: Coronary/Graft Angiography;  Surgeon: Peter M [name], MD;  Location: [facility];  Service: Cardiovascular;;  . COLONOSCOPY    . CORONARY STENT PLACEMENT  05/06/2015   LT CIRC DES X2  . SKIN CANCER EXCISION     right forearm/pre-cancerous/ 2 spots on left arm    reports that he has never smoked. His smokeless tobacco use includes Chew. He  reports that he does not drink alcohol or use drugs. family history includes Brain cancer in his sister; Cancer in his brother and father; Colon cancer in his brother and father; Kidney cancer in his brother, sister, sister, sister, and sister; Stroke in his mother. Allergies  Allergen Reactions  . Penicillins Anaphylaxis    REACTION: joint swelling, breathing issues    . Prednisone Swelling    REACTION: joint swelling, could not walk     Review of Systems  Constitutional: Negative for chills and fever.  HENT: Positive for ear pain. Negative for congestion, ear discharge, facial swelling, hearing loss and nosebleeds.   Cardiovascular: Negative for chest pain.  Skin: Negative for rash.  Hematological: Negative for adenopathy.       Objective:   Physical Exam  Constitutional: He appears well-developed and well-nourished.  HENT:  Right Ear: External ear normal.  Left Ear: External ear normal.  Mouth/Throat: Oropharynx is clear and moist.  He has some malalignment of the temporomandibular joint and tenderness in palpating over the right TMJ joint. No mastoid swelling or erythema or warmth  Neck: Neck supple.  Cardiovascular: Normal rate and regular rhythm.   Pulmonary/Chest: Effort normal and breath sounds normal. No respiratory distress. He has no wheezes. He has no rales.  Lymphadenopathy:    He has no cervical adenopathy.       Assessment:     Right otalgia. Normal ear exam. He does have some tenderness  of the TMJ joint and question if referred pain    Plan:     -Avoid nonsteroidals with his cardiac history -Avoid hard to chew foods over the next several days -Try some icing for symptomatic relief -Consider referral to oral surgeon if not resolving over the next several days  [name] MD [facility] at [facility]

## 2016-10-19 NOTE — Patient Instructions (Signed)
Temporomandibular Joint Syndrome Temporomandibular joint (TMJ) syndrome is a condition that affects the joints between your jaw and your skull. The TMJs are located near your ears and allow your jaw to open and close. These joints and the nearby muscles are involved in all movements of the jaw. People with TMJ syndrome have pain in the area of these joints and muscles. Chewing, biting, or other movements of the jaw can be difficult or painful. TMJ syndrome can be caused by various things. In many cases, the condition is mild and goes away within a few weeks. For some people, the condition can become a long-term problem. What are the causes? Possible causes of TMJ syndrome include:  Grinding your teeth or clenching your jaw. Some people do this when they are under stress.  Arthritis.  Injury to the jaw.  Head or neck injury.  Teeth or dentures that are not aligned well.  In some cases, the cause of TMJ syndrome may not be known. What are the signs or symptoms? The most common symptom is an aching pain on the side of the head in the area of the TMJ. Other symptoms may include:  Pain when moving your jaw, such as when chewing or biting.  Being unable to open your jaw all the way.  Making a clicking sound when you open your mouth.  Headache.  Earache.  Neck or shoulder pain.  How is this diagnosed? Diagnosis can usually be made based on your symptoms, your medical history, and a physical exam. Your health care provider may check the range of motion of your jaw. Imaging tests, such as X-rays or an MRI, are sometimes done. You may need to see your dentist to determine if your teeth and jaw are lined up correctly. How is this treated? TMJ syndrome often goes away on its own. If treatment is needed, the options may include:  Eating soft foods and applying ice or heat.  Medicines to relieve pain or inflammation.  Medicines to relax the muscles.  A splint, bite plate, or mouthpiece  to prevent teeth grinding or jaw clenching.  Relaxation techniques or counseling to help reduce stress.  Transcutaneous electrical nerve stimulation (TENS). This helps to relieve pain by applying an electrical current through the skin.  Acupuncture. This is sometimes helpful to relieve pain.  Jaw surgery. This is rarely needed.  Follow these instructions at home:  Take medicines only as directed by your health care provider.  Eat a soft diet if you are having trouble chewing.  Apply ice to the painful area. ? Put ice in a plastic bag. ? Place a towel between your skin and the bag. ? Leave the ice on for 20 minutes, 2-3 times a day.  Apply a warm compress to the painful area as directed.  Massage your jaw area and perform any jaw stretching exercises as recommended by your health care provider.  If you were given a mouthpiece or bite plate, wear it as directed.  Avoid foods that require a lot of chewing. Do not chew gum.  Keep all follow-up visits as directed by your health care provider. This is important. Contact a health care provider if:  You are having trouble eating.  You have new or worsening symptoms. Get help right away if:  Your jaw locks open or closed. This information is not intended to replace advice given to you by your health care provider. Make sure you discuss any questions you have with your health care provider. Document   Released: 10/27/2000 Document Revised: 10/02/2015 Document Reviewed: 09/06/2013 Elsevier Interactive Patient Education  2018 Elsevier Inc.  

## 2016-11-04 ENCOUNTER — Encounter: Payer: Self-pay | Admitting: Family Medicine

## 2017-03-11 ENCOUNTER — Ambulatory Visit (INDEPENDENT_AMBULATORY_CARE_PROVIDER_SITE_OTHER): Payer: Medicare Other | Admitting: Family Medicine

## 2017-03-11 VITALS — BP 140/80 | HR 55 | Temp 97.9°F | Ht 65.0 in | Wt 149.6 lb

## 2017-03-11 DIAGNOSIS — Z1159 Encounter for screening for other viral diseases: Secondary | ICD-10-CM

## 2017-03-11 DIAGNOSIS — I10 Essential (primary) hypertension: Secondary | ICD-10-CM | POA: Diagnosis not present

## 2017-03-11 DIAGNOSIS — E785 Hyperlipidemia, unspecified: Secondary | ICD-10-CM

## 2017-03-11 DIAGNOSIS — Z0000 Encounter for general adult medical examination without abnormal findings: Secondary | ICD-10-CM | POA: Diagnosis not present

## 2017-03-11 DIAGNOSIS — L570 Actinic keratosis: Secondary | ICD-10-CM

## 2017-03-11 DIAGNOSIS — I2510 Atherosclerotic heart disease of native coronary artery without angina pectoris: Secondary | ICD-10-CM

## 2017-03-11 NOTE — Progress Notes (Signed)
Subjective:     Patient ID: Terry Rivers, male   DOB: 25-Jun-1950, 67 y.o.   MRN: [id number]  HPI Patient seen for medical follow up.   He has history of CAD, hyperlipidemia, hypertension. Past history of tobacco use.  No recent chest pains. Generally feels well. Compliant with medications. Not monitoring blood pressure recently at home.  Made positive lifestyle changes after his heart event and has done fairly good job maintaining- though his wife states he still eats lots of sausage.  History of adenomatous colon polyp. Due for repeat colonoscopy this summer. No history of hepatitis C screening but low risk. Immunizations up-to-date.  Past Medical History:  Diagnosis Date  . Arthritis   . Benign essential HTN   . Bradycardia    low 50"s per wife  . CAD, multiple vessel-native arteries, emergent PCI RCA with DES 04/15/15 2017   a. 03/2014- inferior STEMI with occluded RCA; b. 04/2015: Staged PCI of LCx  . Chicken pox   . Diabetes mellitus without complication (HCC)    Borderline/no meds  . Frequent headaches   . Hyperlipidemia LDL goal <70 04/17/2015  . Myocardial infarction ([place]) 03/2015  . Tobacco use 04/17/2015   Past Surgical History:  Procedure Laterality Date  . CARDIAC CATHETERIZATION N/A 04/15/2015   Procedure: Left Heart Cath and Coronary Angiography;  Surgeon: Peter M [name], MD;  Location: [place] [facility];  Service: Cardiovascular;  Laterality: N/A;  . CARDIAC CATHETERIZATION N/A 04/15/2015   Procedure: Coronary Stent Intervention;  Surgeon: Peter M [name], MD;  Location: [place] [facility];  Service: Cardiovascular;  Laterality: N/A;  . CARDIAC CATHETERIZATION N/A 05/06/2015   Procedure: Coronary Stent Intervention;  Surgeon: Peter M [name], MD;  Location: [place] [facility];  Service: Cardiovascular;  Laterality: N/A;  . CARDIAC CATHETERIZATION  05/06/2015   Procedure: Coronary/Graft Angiography;  Surgeon: Peter M [name], MD;  Location: [place] [facility];  Service:  Cardiovascular;;  . COLONOSCOPY    . CORONARY STENT PLACEMENT  05/06/2015   LT CIRC DES X2  . SKIN CANCER EXCISION     right forearm/pre-cancerous/ 2 spots on left arm    reports that  has never smoked. His smokeless tobacco use includes chew. He reports that he does not drink alcohol or use drugs. family history includes Brain cancer in his sister; Cancer in his brother and father; Colon cancer in his brother and father; Kidney cancer in his brother, sister, sister, sister, and sister; Stroke in his mother. Allergies  Allergen Reactions  . Penicillins Anaphylaxis    REACTION: joint swelling, breathing issues    . Prednisone Swelling    REACTION: joint swelling, could not walk     Review of Systems  Constitutional: Negative for activity change, appetite change, fatigue and fever.  HENT: Negative for congestion, ear pain and trouble swallowing.   Eyes: Negative for pain and visual disturbance.  Respiratory: Negative for cough, shortness of breath and wheezing.   Cardiovascular: Negative for chest pain and palpitations.  Gastrointestinal: Negative for abdominal distention, abdominal pain, blood in stool, constipation, diarrhea, nausea, rectal pain and vomiting.  Genitourinary: Negative for dysuria, hematuria and testicular pain.  Musculoskeletal: Negative for arthralgias and joint swelling.  Skin: Negative for rash.  Neurological: Negative for dizziness, syncope and headaches.  Hematological: Negative for adenopathy.  Psychiatric/Behavioral: Negative for confusion and dysphoric mood.       Objective:   Physical Exam  Constitutional: He is oriented to person, place, and time. He appears  well-developed and well-nourished. No distress.  HENT:  Head: Normocephalic and atraumatic.  Right Ear: External ear normal.  Left Ear: External ear normal.  Mouth/Throat: Oropharynx is clear and moist.  Eyes: Conjunctivae and EOM are normal. Pupils are equal, round, and reactive to light.   Neck: Normal range of motion. Neck supple. No thyromegaly present.  Cardiovascular: Normal rate, regular rhythm and normal heart sounds.  No murmur heard. Pulmonary/Chest: No respiratory distress. He has no wheezes. He has no rales.  Abdominal: Soft. Bowel sounds are normal. He exhibits no distension and no mass. There is no tenderness. There is no rebound and no guarding.  Musculoskeletal: He exhibits no edema.  Lymphadenopathy:    He has no cervical adenopathy.  Neurological: He is alert and oriented to person, place, and time. He displays normal reflexes. No cranial nerve deficit.  Skin: No rash noted.  Patient has several thickened whitish hyperkeratotic lesions including dorsum right hand and 4 areas other dorsum of right forearm-consistent with actinic keratoses. No ulcerations  Psychiatric: He has a normal mood and affect.       Assessment:     #1  Hx of CAD  #2 actinic keratoses including right hand and right forearm  #3 hypertension- stable.  #4 hyperlipidemia.    Plan:     -Sun protection discussed -Flu vaccine already given -Check hepatitis C antibody -Discussed pros and cons of PSA testing and he had this 6 months ago which was stable and declined at this time -Repeat colonoscopy this summer -We discussed risk and benefits of treatment of actinic keratoses with liquid nitrogen including risk of blistering and infection. Patient consented. Treated total of 5 lesions 1 dorsum of right hand and 4 right forearm. Pt tolerated well.    [name] MD [facility]

## 2017-03-12 LAB — HEPATITIS C ANTIBODY
HEP C AB: NONREACTIVE
SIGNAL TO CUT-OFF: 0.02 (ref <1.00)

## 2017-03-30 ENCOUNTER — Telehealth: Payer: Self-pay | Admitting: Cardiology

## 2017-03-30 NOTE — Telephone Encounter (Signed)
New message    *STAT* If patient is at the pharmacy, call can be transferred to refill team.   1. Which medications need to be refilled? (please list name of each medication and dose if known) atorvastatin (LIPITOR) 80 MG tablet , lisinopril (PRINIVIL,ZESTRIL) 10 MG tablet, nitroGLYCERIN (NITROSTAT) 0.4 MG SL tablet and pantoprazole (PROTONIX) 40 MG tablet  2. Which pharmacy/location (including street and city if local pharmacy) is medication to be sent to? [id number] - [place], [place] - [place]  3. Do they need a 30 day or 90 day supply? [place]

## 2017-03-31 MED ORDER — ATORVASTATIN CALCIUM 80 MG PO TABS: 80.0000 mg | ORAL_TABLET | Freq: Every day | ORAL | 2 refills | Status: DC

## 2017-03-31 MED ORDER — LISINOPRIL 10 MG PO TABS: 10.0000 mg | ORAL_TABLET | Freq: Every day | ORAL | 2 refills | Status: DC

## 2017-03-31 MED ORDER — NITROGLYCERIN 0.4 MG SL SUBL: 0.4000 mg | SUBLINGUAL_TABLET | SUBLINGUAL | 3 refills | Status: DC | PRN

## 2017-03-31 MED ORDER — PANTOPRAZOLE SODIUM 40 MG PO TBEC: 40.0000 mg | DELAYED_RELEASE_TABLET | Freq: Every day | ORAL | 2 refills | Status: DC

## 2017-05-02 ENCOUNTER — Encounter: Payer: Self-pay | Admitting: Family Medicine

## 2017-05-02 ENCOUNTER — Ambulatory Visit (INDEPENDENT_AMBULATORY_CARE_PROVIDER_SITE_OTHER): Payer: Medicare Other | Admitting: Family Medicine

## 2017-05-02 VITALS — BP 120/70 | HR 92 | Temp 99.2°F | Wt 154.1 lb

## 2017-05-02 DIAGNOSIS — J069 Acute upper respiratory infection, unspecified: Secondary | ICD-10-CM

## 2017-05-02 DIAGNOSIS — B9789 Other viral agents as the cause of diseases classified elsewhere: Secondary | ICD-10-CM

## 2017-05-02 DIAGNOSIS — I2510 Atherosclerotic heart disease of native coronary artery without angina pectoris: Secondary | ICD-10-CM | POA: Diagnosis not present

## 2017-05-02 MED ORDER — HYDROCODONE-HOMATROPINE 5-1.5 MG/5ML PO SYRP: 5.0000 mL | ORAL_SOLUTION | Freq: Four times a day (QID) | ORAL | 0 refills | Status: AC | PRN

## 2017-05-02 NOTE — Patient Instructions (Signed)
Follow up for any fever or increased shortness of breath. 

## 2017-05-02 NOTE — Progress Notes (Signed)
Subjective:     Patient ID: Terry Rivers, male   DOB: 20-Aug-1950, 67 y.o.   MRN: [id number]  HPI Patient seen with onset last Thursday sore throat, nasal congestion, sinus pressure and now cough. Cough was worse at night. Minimal relief with Robitussin. No fevers or chills. No dyspnea. No history of smoking.  No chest pains  Past Medical History:  Diagnosis Date  . Arthritis   . Benign essential HTN   . Bradycardia    low 50"s per wife  . CAD, multiple vessel-native arteries, emergent PCI RCA with DES 04/15/15 2017   a. 03/2014- inferior STEMI with occluded RCA; b. 04/2015: Staged PCI of LCx  . Chicken pox   . Diabetes mellitus without complication (HCC)    Borderline/no meds  . Frequent headaches   . Hyperlipidemia LDL goal <70 04/17/2015  . Myocardial infarction ([place]) 03/2015  . Tobacco use 04/17/2015   Past Surgical History:  Procedure Laterality Date  . CARDIAC CATHETERIZATION N/A 04/15/2015   Procedure: Left Heart Cath and Coronary Angiography;  Surgeon: Peter M [name], MD;  Location: [place] [facility];  Service: Cardiovascular;  Laterality: N/A;  . CARDIAC CATHETERIZATION N/A 04/15/2015   Procedure: Coronary Stent Intervention;  Surgeon: Peter M [name], MD;  Location: [place] [facility];  Service: Cardiovascular;  Laterality: N/A;  . CARDIAC CATHETERIZATION N/A 05/06/2015   Procedure: Coronary Stent Intervention;  Surgeon: Peter M [name], MD;  Location: [place] [facility];  Service: Cardiovascular;  Laterality: N/A;  . CARDIAC CATHETERIZATION  05/06/2015   Procedure: Coronary/Graft Angiography;  Surgeon: Peter M [name], MD;  Location: [place] [facility];  Service: Cardiovascular;;  . COLONOSCOPY    . CORONARY STENT PLACEMENT  05/06/2015   LT CIRC DES X2  . SKIN CANCER EXCISION     right forearm/pre-cancerous/ 2 spots on left arm    reports that  has never smoked. His smokeless tobacco use includes chew. He reports that he does not drink alcohol or use drugs. family history includes  Brain cancer in his sister; Cancer in his brother and father; Colon cancer in his brother and father; Kidney cancer in his brother, sister, sister, sister, and sister; Stroke in his mother. Allergies  Allergen Reactions  . Penicillins Anaphylaxis    REACTION: joint swelling, breathing issues    . Prednisone Swelling    REACTION: joint swelling, could not walk     Review of Systems  Constitutional: Positive for fatigue. Negative for chills and fever.  HENT: Positive for sinus pressure and sore throat.   Respiratory: Positive for cough. Negative for wheezing.   Cardiovascular: Negative for chest pain.       Objective:   Physical Exam  Constitutional: He appears well-developed and well-nourished.  HENT:  Right Ear: External ear normal.  Left Ear: External ear normal.  Mouth/Throat: Oropharynx is clear and moist.  Neck: Neck supple.  Cardiovascular: Normal rate.  Pulmonary/Chest: Effort normal and breath sounds normal. No respiratory distress. He has no wheezes. He has no rales.  Lymphadenopathy:    He has no cervical adenopathy.       Assessment:     Viral URI with cough. Nonfocal exam    Plan:     -Hycodan cough syrup 1 teaspoon every 6 hours when necessary for severe cough -Plenty of fluids and rest -Follow-up immediately for any fever or worsening symptoms  [name] MD [facility]

## 2017-06-28 ENCOUNTER — Telehealth: Payer: Self-pay | Admitting: Family Medicine

## 2017-06-28 NOTE — Telephone Encounter (Signed)
Copied from [place] [id number]. Topic: Bill or Statement - Patient/Guarantor Inquiry >> Jun 28, 2017 10:57 AM Rutherford Nail, NT wrote: Patient's wife, Suanne Marker, calling and states that they have talked to the Practice Administrator about a bill that needed recoding. States that they have received another bill for $145.00 with the date of serice of 03/11/17. States that the bill is from [facility] lab for a Hepatitis lab. States that they contacted [facility] and was told that the code that was used was the wrong HYQM(57846 CPT). States they told her that it needs to be a diagnostics code and not an annual physical code. [facility] also informed the patient's wife that the office would have to send the new code to [facility] and [facility] would refile. CB#: [phone]  [facility] contact information: AttnShanon Brow Fax#: [phone] Invoice #: [id number]

## 2017-06-28 NOTE — Telephone Encounter (Signed)
Copied from [place] [id number]. Topic: Bill or Statement - Patient/Guarantor Inquiry >> Jun 28, 2017 10:57 AM Rutherford Nail, NT wrote: Patient's wife, Suanne Marker, calling and states that they have talked to the Practice Administrator about a bill that needed recoding. States that they have received another bill for $145.00 with the date of serice of 03/11/17. States that the bill is from [facility] lab for a Hepatitis lab. States that they contacted [facility] and was told that the code that was used was the wrong VFIE(33295 CPT). States they told her that it needs to be a diagnostics code and not an annual physical code. [facility] also informed the patient's wife that the office would have to send the new code to [facility] and [facility] would refile. CB#: [phone]  [facility] contact information: AttnShanon Brow Fax#: [phone] Invoice #: [id number]

## 2017-06-30 NOTE — Telephone Encounter (Signed)
Spoke with [facility] and they have re-coded the dx code to Z00.00 - which is NOT what is on original requisition. We sent lab with Z11.59 code and it was then changed. They have corrected and will re-file claim.   Spoke with pt's wife and advised. She will call if they receive another bill.   Nothing further needed at this time.

## 2017-07-08 ENCOUNTER — Encounter: Payer: Self-pay | Admitting: Gastroenterology

## 2017-07-21 ENCOUNTER — Telehealth: Payer: Self-pay | Admitting: Cardiology

## 2017-07-21 NOTE — Telephone Encounter (Signed)
New Message   Patient is requesting to speak with a nurse in reference to an appointment in August. Offered a PA she declined because she says Dr. [name] told them to only see him. Please call.

## 2017-07-21 NOTE — Telephone Encounter (Signed)
Returned call to wife (ok per DPR)-she states she has called every couple of months and has been told the schedule is not open for August yet.  She states now that it is open she was informed that there were no availabilities.    She is frustrated and states this is his yearly and would like to see his Dr. at least every year.     She also states she wanted to make sure he has his yearly OV in August as he has a colonoscopy coming up after this.    Advised patient is placed on wait list and would make nurse aware to see if there are any options.  Wife aware and verbalized understanding.

## 2017-07-22 NOTE — Telephone Encounter (Signed)
Spoke to patient's wife appointment scheduled with Dr.Jordan 10/13/17 at 3:20 pm.

## 2017-09-23 ENCOUNTER — Encounter: Payer: Self-pay | Admitting: Cardiology

## 2017-10-09 NOTE — Progress Notes (Signed)
Cardiology Office Note:    Date:  10/13/2017   ID:  Terry Rivers, DOB April 29, 1950, MRN [id number]  PCP:  [name], MD  Cardiologist:  Dr. Peter [name]    Chief Complaint  Patient presents with  . Follow-up  . Coronary Artery Disease    History of Present Illness:     Terry Rivers is a 67 y.o. male with a hx of CAD, bradycardia, DM (diet controlled), DJD.    Admitted 2/28-04/17/15 with inferior STEMI.  LHC demonstrated multivessel CAD with occluded RCA as the culprit. RCA was treated with DES.  He has residual LAD/Dx and LCx disease. He was not started on beta-blocker due to bradycardia. On 05/06/15 he underwent staged PCI of the proximal to mid LCx with a DES. The mid LAD had a 60% stenosis with FFR of 0.9 so will be treated medically.  On follow up today he is doing very well.    He stays very busy at home with heavy yard work and gardening. Mows 7 acres of grass.  Denies chest pain with exertion or DOE. Denies syncope, dizziness.  Denies orthopnea, PND, edema.  Energy level is good.    Past Medical History:  Diagnosis Date  . Arthritis   . Benign essential HTN   . Bradycardia    low 50"s per wife  . CAD, multiple vessel-native arteries, emergent PCI RCA with DES 04/15/15 2017   a. 03/2014- inferior STEMI with occluded RCA; b. 04/2015: Staged PCI of LCx  . Chicken pox   . Diabetes mellitus without complication (HCC)    Borderline/no meds  . Frequent headaches   . Hyperlipidemia LDL goal <70 04/17/2015  . Myocardial infarction ([place]) 03/2015  . Tobacco use 04/17/2015    Past Surgical History:  Procedure Laterality Date  . CARDIAC CATHETERIZATION N/A 04/15/2015   Procedure: Left Heart Cath and Coronary Angiography;  Surgeon: Peter M [name], MD;  Location: [place] [facility];  Service: Cardiovascular;  Laterality: N/A;  . CARDIAC CATHETERIZATION N/A 04/15/2015   Procedure: Coronary Stent Intervention;  Surgeon: Peter M [name], MD;  Location: [place] [facility];  Service:  Cardiovascular;  Laterality: N/A;  . CARDIAC CATHETERIZATION N/A 05/06/2015   Procedure: Coronary Stent Intervention;  Surgeon: Peter M [name], MD;  Location: [place] [facility];  Service: Cardiovascular;  Laterality: N/A;  . CARDIAC CATHETERIZATION  05/06/2015   Procedure: Coronary/Graft Angiography;  Surgeon: Peter M [name], MD;  Location: [place] [facility];  Service: Cardiovascular;;  . COLONOSCOPY    . CORONARY STENT PLACEMENT  05/06/2015   LT CIRC DES X2  . SKIN CANCER EXCISION     right forearm/pre-cancerous/ 2 spots on left arm    Current Medications: Outpatient Medications Prior to Visit  Medication Sig Dispense Refill  . acetaminophen (TYLENOL) 325 MG tablet Take 650 mg by mouth every 6 (six) hours as needed for mild pain.    Marland Kitchen aspirin 81 MG chewable tablet Chew 1 tablet (81 mg total) by mouth daily.    Marland Kitchen atorvastatin (LIPITOR) 80 MG tablet Take 1 tablet (80 mg total) by mouth daily at 6 PM. 90 tablet 2  . lisinopril (PRINIVIL,ZESTRIL) 10 MG tablet Take 1 tablet (10 mg total) by mouth daily. 90 tablet 2  . nitroGLYCERIN (NITROSTAT) 0.4 MG SL tablet Place 1 tablet (0.4 mg total) under the tongue every 5 (five) minutes x 3 doses as needed for chest pain. 25 tablet 3  . pantoprazole (PROTONIX) 40 MG tablet Take  1 tablet (40 mg total) by mouth daily. 90 tablet 2   No facility-administered medications prior to visit.      Allergies:   Penicillins and Prednisone   Social History   Socioeconomic History  . Marital status: Married    Spouse name: Not on file  . Number of children: Not on file  . Years of education: Not on file  . Highest education level: Not on file  Occupational History  . Not on file  Social Needs  . Financial resource strain: Not on file  . Food insecurity:    Worry: Not on file    Inability: Not on file  . Transportation needs:    Medical: Not on file    Non-medical: Not on file  Tobacco Use  . Smoking status: Never Smoker  . Smokeless tobacco:  Current User    Types: Chew  . Tobacco comment: chew every day  Substance and Sexual Activity  . Alcohol use: No    Alcohol/week: 0.0 standard drinks  . Drug use: No  . Sexual activity: Not on file  Lifestyle  . Physical activity:    Days per week: Not on file    Minutes per session: Not on file  . Stress: Not on file  Relationships  . Social connections:    Talks on phone: Not on file    Gets together: Not on file    Attends religious service: Not on file    Active member of club or organization: Not on file    Attends meetings of clubs or organizations: Not on file    Relationship status: Not on file  Other Topics Concern  . Not on file  Social History Narrative  . Not on file     Family History:  The patient's family history includes Brain cancer in his sister; Cancer in his brother and father; Colon cancer in his brother and father; Kidney cancer in his brother, sister, sister, sister, and sister; Stroke in his mother.   ROS:   Please see the history of present illness.    ROSAll other systems reviewed and are negative.   Physical Exam:    VS:  BP 114/60 (BP Location: Right Arm, Patient Position: Sitting, Cuff Size: Normal)   Pulse (!) 50   Ht 5\' 5"  (1.651 m)   Wt 146 lb (66.2 kg)   BMI 24.30 kg/m    GENERAL:  Well appearing WM in NAD HEENT:  PERRL, EOMI, sclera are clear. Oropharynx is clear. NECK:  No jugular venous distention, carotid upstroke brisk and symmetric, no bruits, no thyromegaly or adenopathy LUNGS:  Clear to auscultation bilaterally CHEST:  Unremarkable HEART:  RRR,  PMI not displaced or sustained,S1 and S2 within normal limits, no S3, no S4: no clicks, no rubs, no murmurs ABD:  Soft, nontender. BS +, no masses or bruits. No hepatomegaly, no splenomegaly EXT:  2 + pulses throughout, no edema, no cyanosis no clubbing SKIN:  Warm and dry.  No rashes NEURO:  Alert and oriented x 3. Cranial nerves II through XII intact. PSYCH:  Cognitively  intact      Wt Readings from Last 3 Encounters:  10/13/17 146 lb (66.2 kg)  05/02/17 154 lb 1.6 oz (69.9 kg)  03/11/17 149 lb 9.6 oz (67.9 kg)      Studies/Labs Reviewed:     EKG:  EKG is  ordered today. NSR rate 50. Normal Ecg. I have personally reviewed and interpreted this study.  Recent Labs: No  results found for requested labs within last 8760 hours.   Recent Lipid Panel    Component Value Date/Time   CHOL 103 09/27/2016 0821   TRIG 93 09/27/2016 0821   HDL 28 (L) 09/27/2016 0821   CHOLHDL 3.8 09/12/2015 0820   VLDL 22 09/12/2015 0820   LDLCALC 56 09/27/2016 0821   LDLDIRECT 129.0 05/24/2014 0946    Additional studies/ records that were reviewed today include:  None     ASSESSMENT:     1. CAD in native artery   2. Pure hypercholesterolemia   3. Benign essential hypertension     PLAN:     In order of problems listed above:  1. CAD - s/p inf STEMI in March 2017 tx with DES to RCA.  Staged PCI of the proximal to mid LCx with DES. LAD disease with normal FFR. He has residual LAD and LCx disease. Currently asymptomatic.  Continue ASA and statin.   No beta blocker due to history of bradycardia.    2. HTN - BP is well controlled on lisinopril. Check CMET today.  3. HL - Continue statin.  Check fasting lipids today     Medication Adjustments/Labs and Tests Ordered: Current medicines are reviewed at length with the patient today.  Concerns regarding medicines are outlined above.  Medication changes, Labs and Tests ordered today are outlined in the Patient Instructions noted below. Patient Instructions  Continue your current therapy  We will check blood work today       Signed, Peter [name], MD  10/13/2017 5:58 PM    [place]

## 2017-10-13 ENCOUNTER — Ambulatory Visit (INDEPENDENT_AMBULATORY_CARE_PROVIDER_SITE_OTHER): Payer: Medicare Other | Admitting: Cardiology

## 2017-10-13 ENCOUNTER — Encounter: Payer: Self-pay | Admitting: Cardiology

## 2017-10-13 VITALS — BP 114/60 | HR 50 | Ht 65.0 in | Wt 146.0 lb

## 2017-10-13 DIAGNOSIS — E7800 Pure hypercholesterolemia, unspecified: Secondary | ICD-10-CM | POA: Diagnosis not present

## 2017-10-13 DIAGNOSIS — I10 Essential (primary) hypertension: Secondary | ICD-10-CM | POA: Diagnosis not present

## 2017-10-13 DIAGNOSIS — I2510 Atherosclerotic heart disease of native coronary artery without angina pectoris: Secondary | ICD-10-CM | POA: Diagnosis not present

## 2017-10-13 NOTE — Patient Instructions (Signed)
Continue your current therapy  We will check blood work today   

## 2017-10-14 LAB — LIPID PANEL
CHOL/HDL RATIO: 4.3 ratio (ref 0.0–5.0)
CHOLESTEROL TOTAL: 95 mg/dL — AB (ref 100–199)
HDL: 22 mg/dL — AB (ref >39)
LDL Calculated: 48 mg/dL (ref 0–99)
TRIGLYCERIDES: 124 mg/dL (ref 0–149)
VLDL Cholesterol Cal: 25 mg/dL (ref 5–40)

## 2017-10-14 LAB — COMPREHENSIVE METABOLIC PANEL
A/G RATIO: 1.6 (ref 1.2–2.2)
ALBUMIN: 4.4 g/dL (ref 3.6–4.8)
ALK PHOS: 54 IU/L (ref 39–117)
ALT: 19 IU/L (ref 0–44)
AST: 23 IU/L (ref 0–40)
BUN/Creatinine Ratio: 11 (ref 10–24)
BUN: 9 mg/dL (ref 8–27)
Bilirubin Total: 0.7 mg/dL (ref 0.0–1.2)
CO2: 22 mmol/L (ref 20–29)
Calcium: 9 mg/dL (ref 8.6–10.2)
Chloride: 105 mmol/L (ref 96–106)
Creatinine, Ser: 0.8 mg/dL (ref 0.76–1.27)
GFR calc Af Amer: 105 mL/min/{1.73_m2} (ref >59)
GFR, EST NON AFRICAN AMERICAN: 91 mL/min/{1.73_m2} (ref >59)
GLOBULIN, TOTAL: 2.7 g/dL (ref 1.5–4.5)
Glucose: 88 mg/dL (ref 65–99)
Potassium: 4.4 mmol/L (ref 3.5–5.2)
SODIUM: 140 mmol/L (ref 134–144)
Total Protein: 7.1 g/dL (ref 6.0–8.5)

## 2017-12-07 ENCOUNTER — Encounter: Payer: Self-pay | Admitting: Gastroenterology

## 2017-12-08 ENCOUNTER — Ambulatory Visit (INDEPENDENT_AMBULATORY_CARE_PROVIDER_SITE_OTHER): Payer: Medicare Other | Admitting: *Deleted

## 2017-12-08 DIAGNOSIS — Z23 Encounter for immunization: Secondary | ICD-10-CM | POA: Diagnosis not present

## 2017-12-20 ENCOUNTER — Other Ambulatory Visit: Payer: Self-pay | Admitting: Cardiology

## 2017-12-20 NOTE — Telephone Encounter (Signed)
Rx request sent to pharmacy.  

## 2017-12-21 ENCOUNTER — Other Ambulatory Visit: Payer: Self-pay | Admitting: Cardiology

## 2018-01-05 ENCOUNTER — Ambulatory Visit (AMBULATORY_SURGERY_CENTER): Payer: Self-pay

## 2018-01-05 VITALS — Ht 65.0 in | Wt 153.0 lb

## 2018-01-05 DIAGNOSIS — Z86010 Personal history of colonic polyps: Secondary | ICD-10-CM

## 2018-01-05 DIAGNOSIS — Z800 Family history of malignant neoplasm of digestive organs: Secondary | ICD-10-CM

## 2018-01-05 MED ORDER — NA SULFATE-K SULFATE-MG SULF 17.5-3.13-1.6 GM/177ML PO SOLN: 1.0000 | Freq: Once | ORAL | 0 refills | Status: AC

## 2018-01-05 NOTE — Progress Notes (Signed)
No egg or soy allergy known to patient  No issues with past sedation with any surgeries  or procedures, no intubation problems  No diet pills per patient No home 02 use per patient  No blood thinners per patient  Pt denies issues with constipation  No A fib or A flutter  EMMI video sent to pt's e mail  Pt. declined 

## 2018-01-15 ENCOUNTER — Encounter

## 2018-01-26 ENCOUNTER — Ambulatory Visit (AMBULATORY_SURGERY_CENTER): Payer: Medicare Other | Admitting: Gastroenterology

## 2018-01-26 ENCOUNTER — Encounter: Payer: Self-pay | Admitting: Gastroenterology

## 2018-01-26 VITALS — BP 142/69 | HR 45 | Temp 97.3°F | Resp 10 | Ht 65.0 in | Wt 146.0 lb

## 2018-01-26 DIAGNOSIS — Z86010 Personal history of colonic polyps: Secondary | ICD-10-CM | POA: Diagnosis not present

## 2018-01-26 DIAGNOSIS — Z1211 Encounter for screening for malignant neoplasm of colon: Secondary | ICD-10-CM | POA: Diagnosis not present

## 2018-01-26 MED ORDER — SODIUM CHLORIDE 0.9 % IV SOLN: 500.0000 mL | Freq: Once | INTRAVENOUS | Status: DC

## 2018-01-26 NOTE — Op Note (Signed)
[facility] Patient Name: Terry Rivers Procedure Date: 01/26/2018 7:59 AM MRN: [id number] Endoscopist: Ladene Artist , MD Age: 67 Referring MD:  Date of Birth: 07-26-1950 Gender: Male Account #: [id number] Procedure:                Colonoscopy Indications:              Surveillance: Personal history of adenomatous                            polyps on last colonoscopy 5 years ago Medicines:                Monitored Anesthesia Care Procedure:                Pre-Anesthesia Assessment:                           - Prior to the procedure, a History and Physical                            was performed, and patient medications and                            allergies were reviewed. The patient's tolerance of                            previous anesthesia was also reviewed. The risks                            and benefits of the procedure and the sedation                            options and risks were discussed with the patient.                            All questions were answered, and informed consent                            was obtained. Prior Anticoagulants: The patient has                            taken no previous anticoagulant or antiplatelet                            agents. ASA Grade Assessment: II - A patient with                            mild systemic disease. After reviewing the risks                            and benefits, the patient was deemed in                            satisfactory condition to undergo the procedure.  After obtaining informed consent, the colonoscope                            was passed under direct vision. Throughout the                            procedure, the patient's blood pressure, pulse, and                            oxygen saturations were monitored continuously. The                            Colonoscope was introduced through the anus and                            advanced to the the cecum,  identified by                            appendiceal orifice and ileocecal valve. The                            ileocecal valve, appendiceal orifice, and rectum                            were photographed. The quality of the bowel                            preparation was excellent. The colonoscopy was                            performed without difficulty. The patient tolerated                            the procedure well. Scope In: 8:08:26 AM Scope Out: 8:24:02 AM Scope Withdrawal Time: 0 hours 12 minutes 53 seconds  Total Procedure Duration: 0 hours 15 minutes 36 seconds  Findings:                 The perianal and digital rectal examinations were                            normal.                           Multiple medium-mouthed diverticula were found in                            the left colon.                           Internal hemorrhoids were found during                            retroflexion. The hemorrhoids were small and Grade  I (internal hemorrhoids that do not prolapse).                           The exam was otherwise without abnormality on                            direct and retroflexion views. Complications:            No immediate complications. Estimated blood loss:                            None. Estimated Blood Loss:     Estimated blood loss: none. Impression:               - Diverticulosis in the left colon.                           - Internal hemorrhoids.                           - The examination was otherwise normal on direct                            and retroflexion views.                           - No specimens collected. Recommendation:           - Repeat colonoscopy in 5 years for surveillance.                           - Patient has a contact number available for                            emergencies. The signs and symptoms of potential                            delayed complications were discussed with the                             patient. Return to normal activities tomorrow.                            Written discharge instructions were provided to the                            patient.                           - High fiber diet.                           - Continue present medications. Ladene Artist, MD 01/26/2018 8:27:15 AM This report has been signed electronically.

## 2018-01-26 NOTE — Progress Notes (Signed)
To PACU, VSS. Report to [email or web address] 

## 2018-01-26 NOTE — Patient Instructions (Signed)
YOU HAD AN ENDOSCOPIC PROCEDURE TODAY AT THE [facility]:   Refer to the procedure report that was given to you for any specific questions about what was found during the examination.  If the procedure report does not answer your questions, please call your gastroenterologist to clarify.  If you requested that your care partner not be given the details of your procedure findings, then the procedure report has been included in a sealed envelope for you to review at your convenience later.  YOU SHOULD EXPECT: Some feelings of bloating in the abdomen. Passage of more gas than usual.  Walking can help get rid of the air that was put into your GI tract during the procedure and reduce the bloating. If you had a lower endoscopy (such as a colonoscopy or flexible sigmoidoscopy) you may notice spotting of blood in your stool or on the toilet paper. If you underwent a bowel prep for your procedure, you may not have a normal bowel movement for a few days.  Please Note:  You might notice some irritation and congestion in your nose or some drainage.  This is from the oxygen used during your procedure.  There is no need for concern and it should clear up in a day or so.  SYMPTOMS TO REPORT IMMEDIATELY:   Following lower endoscopy (colonoscopy or flexible sigmoidoscopy):  Excessive amounts of blood in the stool  Significant tenderness or worsening of abdominal pains  Swelling of the abdomen that is new, acute  Fever of 100F or higher  For urgent or emergent issues, a gastroenterologist can be reached at any hour by calling [phone].   DIET:  We do recommend a small meal at first, but then you may proceed to your regular diet.  Drink plenty of fluids but you should avoid alcoholic beverages for 24 hours.  ACTIVITY:  You should plan to take it easy for the rest of today and you should NOT DRIVE or use heavy machinery until tomorrow (because of the sedation medicines used during the test).     FOLLOW UP: Our staff will call the number listed on your records the next business day following your procedure to check on you and address any questions or concerns that you may have regarding the information given to you following your procedure. If we do not reach you, we will leave a message.  However, if you are feeling well and you are not experiencing any problems, there is no need to return our call.  We will assume that you have returned to your regular daily activities without incident.  If any biopsies were taken you will be contacted by phone or by letter within the next 1-3 weeks.  Please call us at [phone] if you have not heard about the biopsies in 3 weeks.    SIGNATURES/CONFIDENTIALITY: You and/or your care partner have signed paperwork which will be entered into your electronic medical record.  These signatures attest to the fact that that the information above on your After Visit Summary has been reviewed and is understood.  Full responsibility of the confidentiality of this discharge information lies with you and/or your care-partner. 

## 2018-01-27 ENCOUNTER — Telehealth: Payer: Self-pay

## 2018-01-27 NOTE — Telephone Encounter (Signed)
  Follow up Call-  Call back number 01/26/2018  Post procedure Call Back phone  # [phone]  Permission to leave phone message Yes  Some recent data might be hidden     Left message

## 2018-03-28 ENCOUNTER — Ambulatory Visit (INDEPENDENT_AMBULATORY_CARE_PROVIDER_SITE_OTHER): Payer: Medicare Other | Admitting: Family Medicine

## 2018-03-28 ENCOUNTER — Encounter: Payer: Self-pay | Admitting: Family Medicine

## 2018-03-28 ENCOUNTER — Other Ambulatory Visit: Payer: Self-pay

## 2018-03-28 VITALS — BP 136/84 | HR 69 | Temp 98.3°F | Ht 64.25 in | Wt 154.0 lb

## 2018-03-28 DIAGNOSIS — Z125 Encounter for screening for malignant neoplasm of prostate: Secondary | ICD-10-CM

## 2018-03-28 DIAGNOSIS — E785 Hyperlipidemia, unspecified: Secondary | ICD-10-CM

## 2018-03-28 DIAGNOSIS — R21 Rash and other nonspecific skin eruption: Secondary | ICD-10-CM

## 2018-03-28 DIAGNOSIS — I10 Essential (primary) hypertension: Secondary | ICD-10-CM | POA: Diagnosis not present

## 2018-03-28 DIAGNOSIS — I2510 Atherosclerotic heart disease of native coronary artery without angina pectoris: Secondary | ICD-10-CM | POA: Diagnosis not present

## 2018-03-28 LAB — PSA, MEDICARE: PSA: 2.16 ng/mL (ref 0.10–4.00)

## 2018-03-28 MED ORDER — TRIAMCINOLONE ACETONIDE 0.1 % EX CREA: 1.0000 "application " | TOPICAL_CREAM | Freq: Two times a day (BID) | CUTANEOUS | 1 refills | Status: DC

## 2018-03-28 NOTE — Progress Notes (Signed)
Subjective:     Patient ID: Terry Rivers, male   DOB: [birth year], 68 y.o.   MRN: [id number]  HPI Patient seen for medical follow-up  Skin rash forearm left side and right elbow.  Slightly raised and papular.  None vesicular.  Nonpainful.  Moderate pruritus.  He tried some type of anti-itch over-the-counter with mild relief.  Denies any rash on his knees or other extensor surfaces.  History of CAD.  He has hyperlipidemia treated with atorvastatin.  Lipids per cardiology back in the summer which were stable.  He has hypertension treated with lisinopril.  Blood pressure stable.  No recent chest pains.  History of elevated PSA.  His PSA did come back down when checked last 2018.  He would like to have this checked at least once more.  Generally feels well overall.  No recent falls.  Mood stable  Past Medical History:  Diagnosis Date  . Arthritis   . Benign essential HTN   . Bradycardia    low 50"s per wife  . CAD, multiple vessel-native arteries, emergent PCI RCA with DES 04/15/15 2017   a. 03/2014- inferior STEMI with occluded RCA; b. 04/2015: Staged PCI of LCx  . Chicken pox   . Diabetes mellitus without complication (HCC)    Borderline/no meds  . Frequent headaches   . GERD (gastroesophageal reflux disease)   . Hyperlipidemia LDL goal <70 04/17/2015  . Myocardial infarction ([place]) 03/2015  . Tobacco use 04/17/2015   Past Surgical History:  Procedure Laterality Date  . CARDIAC CATHETERIZATION N/A 04/15/2015   Procedure: Left Heart Cath and Coronary Angiography;  Surgeon: Peter M [name], MD;  Location: [facility];  Service: Cardiovascular;  Laterality: N/A;  . CARDIAC CATHETERIZATION N/A 04/15/2015   Procedure: Coronary Stent Intervention;  Surgeon: Peter M [name], MD;  Location: [facility];  Service: Cardiovascular;  Laterality: N/A;  . CARDIAC CATHETERIZATION N/A 05/06/2015   Procedure: Coronary Stent Intervention;  Surgeon: Peter M [name], MD;  Location: [facility];  Service: Cardiovascular;  Laterality: N/A;  . CARDIAC CATHETERIZATION  05/06/2015   Procedure: Coronary/Graft Angiography;  Surgeon: Peter M [name], MD;  Location: [facility];  Service: Cardiovascular;;  . COLONOSCOPY    . CORONARY STENT PLACEMENT  05/06/2015   LT CIRC DES X2  . POLYPECTOMY    . SKIN CANCER EXCISION     right forearm/pre-cancerous/ 2 spots on left arm    reports that he has never smoked. His smokeless tobacco use includes chew. He reports that he does not drink alcohol or use drugs. family history includes Brain cancer in his sister; Cancer in his brother and father; Colon cancer in his brother and father; Kidney cancer in his brother, sister, sister, sister, and sister; Stroke in his mother. Allergies  Allergen Reactions  . Penicillins Anaphylaxis    REACTION: joint swelling, breathing issues    . Prednisone Swelling    REACTION: joint swelling, could not walk     Review of Systems  Constitutional: Negative for fatigue.  Eyes: Negative for visual disturbance.  Respiratory: Negative for cough, chest tightness and shortness of breath.   Cardiovascular: Negative for chest pain, palpitations and leg swelling.  Gastrointestinal: Negative for abdominal pain.  Endocrine: Negative for polydipsia and polyuria.  Skin: Positive for rash.  Neurological: Negative for dizziness, syncope, weakness, light-headedness and headaches.       Objective:   Physical Exam Constitutional:      Appearance: He is  well-developed.  HENT:     Right Ear: External ear normal.     Left Ear: External ear normal.  Eyes:     Pupils: Pupils are equal, round, and reactive to light.  Neck:     Musculoskeletal: Neck supple.     Thyroid: No thyromegaly.  Cardiovascular:     Rate and Rhythm: Normal rate and regular rhythm.  Pulmonary:     Effort: Pulmonary effort is normal. No respiratory distress.     Breath sounds: Normal breath sounds. No wheezing or rales.  Skin:     Findings: Rash present.     Comments: Slightly raised papular rash right elbow.  No pustules.  Similar rash left forearm extensor surface.  Nontender.  Neurological:     Mental Status: He is alert and oriented to person, place, and time.        Assessment:     #1 papular skin rash right elbow and left forearm.  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  No other extensor surface involvement  #2 history of CAD  #3 hyperlipidemia treated with Lipitor  #4 hypertension stable and at goal  #5 past history of elevated PSA    Plan:     -Triamcinolone 0.1% cream is twice daily as needed and touch base if rash not improving -Recent labs per cardiology reviewed and stable -We will check PSA.  We discussed possible issues of false positives and false negatives  [name] MD [facility]

## 2018-06-16 DIAGNOSIS — I200 Unstable angina: Secondary | ICD-10-CM

## 2018-06-16 HISTORY — DX: Unstable angina: I20.0

## 2018-06-20 ENCOUNTER — Telehealth: Payer: Self-pay | Admitting: Cardiology

## 2018-06-20 NOTE — Telephone Encounter (Signed)
New Message    Pts wife is calling because he is due for his follow up in August, first available was September. We did schedule the pt but she would like an appt in August and asked for the nurse to call her back  Please call

## 2018-06-20 NOTE — Telephone Encounter (Signed)
Called patient, she is okay with the appointment in September.  No other questions or concerns.

## 2018-07-11 ENCOUNTER — Telehealth: Payer: Self-pay | Admitting: Cardiology

## 2018-07-11 ENCOUNTER — Ambulatory Visit: Payer: Self-pay

## 2018-07-11 NOTE — Telephone Encounter (Signed)
Ok noted going to ED  Peter [name] MD, [facility]

## 2018-07-11 NOTE — Telephone Encounter (Signed)
New Message   Pts wife is calling and says the pt is having some chest tightness, not pain. And he notices dryness.    Pt c/o of Chest Pain: STAT if CP now or developed within 24 hours  1. Are you having CP right now? He says he has it all night long and in the mornings. Says it gets tight in the center of the chest   2. Are you experiencing any other symptoms (ex. SOB, nausea, vomiting, sweating)? SOB when he has the tightness, not painfull just gets really tight   3. How long have you been experiencing CP? Started in March but is progressing   4. Is your CP continuous or coming and going? Every day, all night long and early in the mornings. But during the day its not as bad   5. Have you taken Nitroglycerin? No   ?

## 2018-07-11 NOTE — Telephone Encounter (Signed)
Wife called for pt to report tightness in his chest that radiates to his neck. Pt stated not pain just a tightness that is occurring. Pt has many risk factors (prior MI, HTN, 2 stents, high cholesterol and chews tobacco)the patient stated he was worried his symptoms are heart related. Pt c/o tightness in his chest all night.  Care advice given and pt verbalized understanding. Pt going to ED. Reason for Disposition . Pain also present in shoulder(s) or arm(s) or jaw  (Exception: pain is clearly made worse by movement)  Answer Assessment - Initial Assessment Questions 1. LOCATION: "Where does it hurt?"       Feels tightness right that goes to the throat  2. RADIATION: "Does the pain go anywhere else?" (e.g., into neck, jaw, arms, back)     neck 3. ONSET: "When did the chest pain begin?" (Minutes, hours or days)      Last week  4. PATTERN "Does the pain come and go, or has it been constant since it started?"  "Does it get worse with exertion?"      Same since March- but worse with exertion- symptoms every morning and at late  Rests during daytime 5. DURATION: "How long does it last" (e.g., seconds, minutes, hours)     All night stops with after exertion 6. SEVERITY: "How bad is the pain?"  (e.g., Scale 1-10; mild, moderate, or severe)    - MILD (1-3): doesn't interfere with normal activities     - MODERATE (4-7): interferes with normal activities or awakens from sleep    - SEVERE (8-10): excruciating pain, unable to do any normal activities       Not a pain and just a tightness that moves up to his neck 7. CARDIAC RISK FACTORS: "Do you have any history of heart problems or risk factors for heart disease?" (e.g., prior heart attack, angina; high blood pressure, diabetes, being overweight, high cholesterol, smoking, or strong family history of heart disease)     Prior heart attack, HTN, has 2 stents, high cholesterol, chews tobacco 8. PULMONARY RISK FACTORS: "Do you have any history of lung  disease?"  (e.g., blood clots in lung, asthma, emphysema, birth control pills)     no 9. CAUSE: "What do you think is causing the chest pain?"     Worried it may be cardiac related 10. OTHER SYMPTOMS: "Do you have any other symptoms?" (e.g., dizziness, nausea, vomiting, sweating, fever, difficulty breathing, cough)       Tightness in chest all night  11. PREGNANCY: "Is there any chance you are pregnant?" "When was your last menstrual period?"       n/a  Protocols used: CHEST PAIN-A-AH

## 2018-07-11 NOTE — Telephone Encounter (Signed)
In reviewing the pt chart... pt is being sent to the ER per [facility]... will monitor in chart and notify DR. [name] .

## 2018-07-11 NOTE — Telephone Encounter (Signed)
Will monitor for ED arrival.  

## 2018-07-11 NOTE — Telephone Encounter (Signed)
New Message   Patient's wife calling back again stating husband isn't going to the ER and would like a nurse to give them a call back.

## 2018-07-11 NOTE — Telephone Encounter (Signed)
Spoke with pt who state husband didn't go to ER but is having c/o of chest pain that's described as a tightness/twisitng feeling. She report she can telling he doesn't feel well and something is not right. Advise pt that it's very important that pt go to the ED for evaluations especially giving pts prior hx. Wife verbalized understanding and state she will try to make him go.

## 2018-07-12 ENCOUNTER — Inpatient Hospital Stay (HOSPITAL_COMMUNITY)
Admission: EM | Admit: 2018-07-12 | Discharge: 2018-07-14 | DRG: 247 | Disposition: A | Discharge status: Home / Self Care | Payer: Medicare Other | Attending: Cardiology | Admitting: Cardiology

## 2018-07-12 ENCOUNTER — Other Ambulatory Visit: Payer: Self-pay

## 2018-07-12 ENCOUNTER — Emergency Department (HOSPITAL_COMMUNITY): Payer: Medicare Other

## 2018-07-12 ENCOUNTER — Encounter (HOSPITAL_COMMUNITY): Payer: Self-pay

## 2018-07-12 DIAGNOSIS — Z20828 Contact with and (suspected) exposure to other viral communicable diseases: Secondary | ICD-10-CM | POA: Diagnosis present

## 2018-07-12 DIAGNOSIS — I25110 Atherosclerotic heart disease of native coronary artery with unstable angina pectoris: Secondary | ICD-10-CM | POA: Diagnosis not present

## 2018-07-12 DIAGNOSIS — Z955 Presence of coronary angioplasty implant and graft: Secondary | ICD-10-CM

## 2018-07-12 DIAGNOSIS — E785 Hyperlipidemia, unspecified: Secondary | ICD-10-CM | POA: Diagnosis present

## 2018-07-12 DIAGNOSIS — Z888 Allergy status to other drugs, medicaments and biological substances status: Secondary | ICD-10-CM

## 2018-07-12 DIAGNOSIS — R079 Chest pain, unspecified: Secondary | ICD-10-CM | POA: Diagnosis not present

## 2018-07-12 DIAGNOSIS — I10 Essential (primary) hypertension: Secondary | ICD-10-CM

## 2018-07-12 DIAGNOSIS — Z8051 Family history of malignant neoplasm of kidney: Secondary | ICD-10-CM

## 2018-07-12 DIAGNOSIS — I252 Old myocardial infarction: Secondary | ICD-10-CM

## 2018-07-12 DIAGNOSIS — E7800 Pure hypercholesterolemia, unspecified: Secondary | ICD-10-CM | POA: Diagnosis present

## 2018-07-12 DIAGNOSIS — E119 Type 2 diabetes mellitus without complications: Secondary | ICD-10-CM | POA: Diagnosis present

## 2018-07-12 DIAGNOSIS — Z808 Family history of malignant neoplasm of other organs or systems: Secondary | ICD-10-CM

## 2018-07-12 DIAGNOSIS — K219 Gastro-esophageal reflux disease without esophagitis: Secondary | ICD-10-CM | POA: Diagnosis present

## 2018-07-12 DIAGNOSIS — Z823 Family history of stroke: Secondary | ICD-10-CM

## 2018-07-12 DIAGNOSIS — I200 Unstable angina: Secondary | ICD-10-CM

## 2018-07-12 DIAGNOSIS — R0789 Other chest pain: Secondary | ICD-10-CM | POA: Diagnosis not present

## 2018-07-12 DIAGNOSIS — Z800 Family history of malignant neoplasm of digestive organs: Secondary | ICD-10-CM

## 2018-07-12 DIAGNOSIS — Z7982 Long term (current) use of aspirin: Secondary | ICD-10-CM

## 2018-07-12 DIAGNOSIS — M1990 Unspecified osteoarthritis, unspecified site: Secondary | ICD-10-CM | POA: Diagnosis present

## 2018-07-12 DIAGNOSIS — R0602 Shortness of breath: Secondary | ICD-10-CM | POA: Diagnosis not present

## 2018-07-12 DIAGNOSIS — Z720 Tobacco use: Secondary | ICD-10-CM | POA: Diagnosis present

## 2018-07-12 DIAGNOSIS — Z880 Allergy status to penicillin: Secondary | ICD-10-CM

## 2018-07-12 LAB — CBC
HCT: 42.1 % (ref 39.0–52.0)
HCT: 43.7 % (ref 39.0–52.0)
Hemoglobin: 14.6 g/dL (ref 13.0–17.0)
Hemoglobin: 15 g/dL (ref 13.0–17.0)
MCH: 30.8 pg (ref 26.0–34.0)
MCH: 30.5 pg (ref 26.0–34.0)
MCHC: 34.7 g/dL (ref 30.0–36.0)
MCHC: 34.3 g/dL (ref 30.0–36.0)
MCV: 88.8 fL (ref 80.0–100.0)
MCV: 89 fL (ref 80.0–100.0)
Platelets: 195 K/uL (ref 150–400)
Platelets: 219 10*3/uL (ref 150–400)
RBC: 4.74 MIL/uL (ref 4.22–5.81)
RBC: 4.91 MIL/uL (ref 4.22–5.81)
RDW: 12.7 % (ref 11.5–15.5)
RDW: 12.9 % (ref 11.5–15.5)
WBC: 8.7 K/uL (ref 4.0–10.5)
WBC: 9.6 10*3/uL (ref 4.0–10.5)
nRBC: 0 % (ref 0.0–0.2)
nRBC: 0 % (ref 0.0–0.2)

## 2018-07-12 LAB — CREATININE, SERUM
Creatinine, Ser: 0.83 mg/dL (ref 0.61–1.24)
GFR calc Af Amer: >60 mL/min (ref >60)
GFR calc non Af Amer: >60 mL/min (ref >60)

## 2018-07-12 LAB — BASIC METABOLIC PANEL
Anion gap: 12 (ref 5–15)
BUN: 6 mg/dL — ABNORMAL LOW (ref 8–23)
CO2: 21 mmol/L — ABNORMAL LOW (ref 22–32)
Calcium: 9.1 mg/dL (ref 8.9–10.3)
Chloride: 108 mmol/L (ref 98–111)
Creatinine, Ser: 0.8 mg/dL (ref 0.61–1.24)
GFR calc Af Amer: >60 mL/min (ref >60)
GFR calc non Af Amer: >60 mL/min (ref >60)
Glucose, Bld: 135 mg/dL — ABNORMAL HIGH (ref 70–99)
Potassium: 3.4 mmol/L — ABNORMAL LOW (ref 3.5–5.1)
Sodium: 141 mmol/L (ref 135–145)

## 2018-07-12 LAB — TROPONIN I
Troponin I: <0.03 ng/mL
Troponin I: <0.03 ng/mL
Troponin I: <0.03 ng/mL (ref <0.03)

## 2018-07-12 LAB — D-DIMER, QUANTITATIVE: D-Dimer, Quant: 0.65 ug{FEU}/mL — ABNORMAL HIGH (ref 0.00–0.50)

## 2018-07-12 LAB — BRAIN NATRIURETIC PEPTIDE: B Natriuretic Peptide: 18.3 pg/mL (ref 0.0–100.0)

## 2018-07-12 LAB — SARS CORONAVIRUS 2 BY RT PCR (HOSPITAL ORDER, PERFORMED IN ~~LOC~~ HOSPITAL LAB): SARS Coronavirus 2: NEGATIVE

## 2018-07-12 LAB — CBG MONITORING, ED: Glucose-Capillary: 117 mg/dL — ABNORMAL HIGH (ref 70–99)

## 2018-07-12 MED ORDER — ONDANSETRON HCL 4 MG/2ML IJ SOLN: 4.0000 mg | Freq: Four times a day (QID) | INTRAMUSCULAR | Status: DC | PRN

## 2018-07-12 MED ORDER — ACETAMINOPHEN 325 MG PO TABS: 650.0000 mg | ORAL_TABLET | Freq: Four times a day (QID) | ORAL | Status: DC | PRN

## 2018-07-12 MED ORDER — ACETAMINOPHEN 325 MG PO TABS: 650.0000 mg | ORAL_TABLET | ORAL | Status: DC | PRN

## 2018-07-12 MED ORDER — LISINOPRIL 20 MG PO TABS
20.0000 mg | ORAL_TABLET | Freq: Every day | ORAL | Status: DC
  Administered 2018-07-12 – 2018-07-14 (×3): 20 mg via ORAL
  Filled 2018-07-12 (×3): qty 1

## 2018-07-12 MED ORDER — SODIUM CHLORIDE 0.9 % WEIGHT BASED INFUSION
1.0000 mL/kg/h | INTRAVENOUS | Status: DC
  Administered 2018-07-13: 05:00:00 1 mL/kg/h via INTRAVENOUS

## 2018-07-12 MED ORDER — ASPIRIN 81 MG PO CHEW: 81.0000 mg | CHEWABLE_TABLET | Freq: Every day | ORAL | Status: DC

## 2018-07-12 MED ORDER — ASPIRIN 81 MG PO CHEW
81.0000 mg | CHEWABLE_TABLET | ORAL | Status: AC
  Administered 2018-07-13: 06:00:00 81 mg via ORAL
  Filled 2018-07-12: qty 1

## 2018-07-12 MED ORDER — NITROGLYCERIN 0.4 MG SL SUBL: 0.4000 mg | SUBLINGUAL_TABLET | SUBLINGUAL | Status: DC | PRN

## 2018-07-12 MED ORDER — AMLODIPINE BESYLATE 5 MG PO TABS
5.0000 mg | ORAL_TABLET | Freq: Every day | ORAL | Status: DC
  Administered 2018-07-12 – 2018-07-14 (×3): 5 mg via ORAL
  Filled 2018-07-12 (×3): qty 1

## 2018-07-12 MED ORDER — SODIUM CHLORIDE 0.9% FLUSH
3.0000 mL | Freq: Two times a day (BID) | INTRAVENOUS | Status: DC
  Administered 2018-07-12 – 2018-07-14 (×3): 3 mL via INTRAVENOUS

## 2018-07-12 MED ORDER — ASPIRIN 81 MG PO CHEW
324.0000 mg | CHEWABLE_TABLET | Freq: Once | ORAL | Status: AC
  Administered 2018-07-12: 324 mg via ORAL
  Filled 2018-07-12: qty 4

## 2018-07-12 MED ORDER — ATORVASTATIN CALCIUM 80 MG PO TABS
80.0000 mg | ORAL_TABLET | Freq: Every day | ORAL | Status: DC
  Administered 2018-07-12 – 2018-07-13 (×2): 80 mg via ORAL
  Filled 2018-07-12 (×2): qty 1

## 2018-07-12 MED ORDER — ASPIRIN 81 MG PO CHEW: 324.0000 mg | CHEWABLE_TABLET | ORAL | Status: AC

## 2018-07-12 MED ORDER — PANTOPRAZOLE SODIUM 40 MG PO TBEC
40.0000 mg | DELAYED_RELEASE_TABLET | Freq: Every day | ORAL | Status: DC
  Administered 2018-07-13 – 2018-07-14 (×2): 40 mg via ORAL
  Filled 2018-07-12 (×2): qty 1

## 2018-07-12 MED ORDER — SODIUM CHLORIDE 0.9 % IV SOLN: 250.0000 mL | INTRAVENOUS | Status: DC | PRN

## 2018-07-12 MED ORDER — HEPARIN SODIUM (PORCINE) 5000 UNIT/ML IJ SOLN
5000.0000 [IU] | Freq: Three times a day (TID) | INTRAMUSCULAR | Status: DC
  Administered 2018-07-12 – 2018-07-14 (×4): 5000 [IU] via SUBCUTANEOUS
  Filled 2018-07-12 (×5): qty 1

## 2018-07-12 MED ORDER — ASPIRIN EC 81 MG PO TBEC: 81.0000 mg | DELAYED_RELEASE_TABLET | Freq: Every day | ORAL | Status: DC

## 2018-07-12 MED ORDER — SODIUM CHLORIDE 0.9 % WEIGHT BASED INFUSION
3.0000 mL/kg/h | INTRAVENOUS | Status: DC
  Administered 2018-07-13: 04:00:00 3 mL/kg/h via INTRAVENOUS

## 2018-07-12 MED ORDER — SODIUM CHLORIDE 0.9% FLUSH: 3.0000 mL | INTRAVENOUS | Status: DC | PRN

## 2018-07-12 MED ORDER — ASPIRIN 300 MG RE SUPP: 300.0000 mg | RECTAL | Status: AC

## 2018-07-12 NOTE — Telephone Encounter (Signed)
Called patient and spoke to wife and she stated that she knows something is wrong because he normally does not say anything if he is not worried but patient does not want to go to the ED. I advised that this is the best option and she stated that she will take him to fire department or have someone come to the house or she will take him to ED once she returns home, she was getting her oil changed in her car. We will keep an eye out for his admission and she stated that she will call back when he is admitted.

## 2018-07-12 NOTE — Telephone Encounter (Signed)
Patient is on the way to the ER. Patient wife called back 12:34pm.  Call back # [phone]

## 2018-07-12 NOTE — Telephone Encounter (Signed)
Please see message. I can call again and recommend ED

## 2018-07-12 NOTE — ED Notes (Signed)
ED TO INPATIENT HANDOFF REPORT  ED Nurse Name and Phone #: Lovena Le [phone]  S Name/Age/Gender Terry Rivers 69 y.o. male Room/Bed: 202R/427C  Code Status   Code Status: Prior  Home/SNF/Other Home Patient oriented to: self, place, time and situation Is this baseline? Yes   Triage Complete: Triage complete  Chief Complaint chest pain,sob  Triage Note Pt reports intermittent chest pain since march that radiates to his neck and intermittent SOB. Pt called cardiologist office yesterday and was advised to come here. Pt a.o, nad noted.    Allergies Allergies  Allergen Reactions  . Penicillins Anaphylaxis    REACTION: joint swelling, breathing issues    . Prednisone Swelling    REACTION: joint swelling, could not walk    Level of Care/Admitting Diagnosis ED Disposition    ED Disposition Condition Comment   Admit  Hospital Area: [facility] [[id number]]  Level of Care: Telemetry Cardiac [103]  Covid Evaluation: N/A  Diagnosis: Unstable angina [facility]) [[id number]]  Admitting Physician: [name], PETER M [[id number]]  Attending Physician: [name], PETER M [[id number]]  PT Class (Do Not Modify): Observation [104]  PT Acc Code (Do Not Modify): Observation [10022]       B Medical/Surgery History Past Medical History:  Diagnosis Date  . Arthritis   . Benign essential HTN   . Bradycardia    low 50"s per wife  . CAD, multiple vessel-native arteries, emergent PCI RCA with DES 04/15/15 2017   a. 03/2014- inferior STEMI with occluded RCA; b. 04/2015: Staged PCI of LCx  . Chicken pox   . Diabetes mellitus without complication (HCC)    Borderline/no meds  . Frequent headaches   . GERD (gastroesophageal reflux disease)   . Hyperlipidemia LDL goal <70 04/17/2015  . Myocardial infarction ([place]) 03/2015  . Tobacco use 04/17/2015   Past Surgical History:  Procedure Laterality Date  . CARDIAC CATHETERIZATION N/A 04/15/2015   Procedure: Left Heart Cath and Coronary Angiography;  Surgeon:  Peter M [name], MD;  Location: [place] [facility];  Service: Cardiovascular;  Laterality: N/A;  . CARDIAC CATHETERIZATION N/A 04/15/2015   Procedure: Coronary Stent Intervention;  Surgeon: Peter M [name], MD;  Location: [facility];  Service: Cardiovascular;  Laterality: N/A;  . CARDIAC CATHETERIZATION N/A 05/06/2015   Procedure: Coronary Stent Intervention;  Surgeon: Peter M [name], MD;  Location: [facility];  Service: Cardiovascular;  Laterality: N/A;  . CARDIAC CATHETERIZATION  05/06/2015   Procedure: Coronary/Graft Angiography;  Surgeon: Peter M [name], MD;  Location: [place] [facility];  Service: Cardiovascular;;  . COLONOSCOPY    . CORONARY STENT PLACEMENT  05/06/2015   LT CIRC DES X2  . POLYPECTOMY    . SKIN CANCER EXCISION     right forearm/pre-cancerous/ 2 spots on left arm     A IV Location/Drains/Wounds Patient Lines/Drains/Airways Status   Active Line/Drains/Airways    Name:   Placement date:   Placement time:   Site:   Days:   Peripheral IV 07/12/18 Left Forearm   07/12/18    1416    Forearm   less than 1          Intake/Output Last 24 hours No intake or output data in the 24 hours ending 07/12/18 1639  Labs/Imaging Results for orders placed or performed during the hospital encounter of 07/12/18 (from the past 48 hour(s))  CBG monitoring, ED     Status: Abnormal   Collection Time: 07/12/18  2:15 PM  Result  Value Ref Range   Glucose-Capillary 117 (H) 70 - 99 mg/dL  Basic metabolic panel     Status: Abnormal   Collection Time: 07/12/18  2:17 PM  Result Value Ref Range   Sodium 141 135 - 145 mmol/L   Potassium 3.4 (L) 3.5 - 5.1 mmol/L   Chloride 108 98 - 111 mmol/L   CO2 21 (L) 22 - 32 mmol/L   Glucose, Bld 135 (H) 70 - 99 mg/dL   BUN 6 (L) 8 - 23 mg/dL   Creatinine, Ser 0.80 0.61 - 1.24 mg/dL   Calcium 9.1 8.9 - 10.3 mg/dL   GFR calc non Af Amer >60 >60 mL/min   GFR calc Af Amer >60 >60 mL/min   Anion gap 12 5 - 15    Comment: Performed at [facility], [place] [address], [place], [place] [address]  Troponin I - Now Then Q3H     Status: None   Collection Time: 07/12/18  2:17 PM  Result Value Ref Range   Troponin I <0.03 <0.03 ng/mL    Comment: Performed at [facility] [address] [place], [place] [address]  CBC     Status: None   Collection Time: 07/12/18  2:17 PM  Result Value Ref Range   WBC 8.7 4.0 - 10.5 K/uL   RBC 4.74 4.22 - 5.81 MIL/uL   Hemoglobin 14.6 13.0 - 17.0 g/dL   HCT 42.1 39.0 - 52.0 %   MCV 88.8 80.0 - 100.0 fL   MCH 30.8 26.0 - 34.0 pg   MCHC 34.7 30.0 - 36.0 g/dL   RDW 12.7 11.5 - 15.5 %   Platelets 195 150 - 400 K/uL   nRBC 0.0 0.0 - 0.2 %    Comment: Performed at [facility], [place] [address], [place], [place] [address]  D-dimer, quantitative (not at [facility])     Status: Abnormal   Collection Time: 07/12/18  2:17 PM  Result Value Ref Range   D-Dimer, Quant 0.65 (H) 0.00 - 0.50 ug/mL-FEU    Comment: (NOTE) At the manufacturer cut-off of 0.50 ug/mL FEU, this assay has been documented to exclude PE with a sensitivity and negative predictive value of 97 to 99%.  At this time, this assay has not been approved by the FDA to exclude DVT/VTE. Results should be correlated with clinical presentation. Performed at [facility], [place] [address], [place], [place] [address]   SARS Coronavirus 2 (CEPHEID - Performed in [facility] hospital lab), Hosp Order     Status: None   Collection Time: 07/12/18  2:17 PM  Result Value Ref Range   SARS Coronavirus 2 NEGATIVE NEGATIVE    Comment: (NOTE) If result is NEGATIVE SARS-CoV-2 target nucleic acids are NOT DETECTED. The SARS-CoV-2 RNA is generally detectable in upper and lower  respiratory specimens during the acute phase of infection. The lowest  concentration of SARS-CoV-2 viral copies this assay can detect is 250  copies / mL. A negative result does not preclude SARS-CoV-2 infection  and should not be used as the sole basis for treatment  or other  patient management decisions.  A negative result may occur with  improper specimen collection / handling, submission of specimen other  than nasopharyngeal swab, presence of viral mutation(s) within the  areas targeted by this assay, and inadequate number of viral copies  (<250 copies / mL). A negative result must be combined with clinical  observations, patient history, and epidemiological  information. If result is POSITIVE SARS-CoV-2 target nucleic acids are DETECTED. The SARS-CoV-2 RNA is generally detectable in upper and lower  respiratory specimens dur ing the acute phase of infection.  Positive  results are indicative of active infection with SARS-CoV-2.  Clinical  correlation with patient history and other diagnostic information is  necessary to determine patient infection status.  Positive results do  not rule out bacterial infection or co-infection with other viruses. If result is PRESUMPTIVE POSTIVE SARS-CoV-2 nucleic acids MAY BE PRESENT.   A presumptive positive result was obtained on the submitted specimen  and confirmed on repeat testing.  While 2019 novel coronavirus  (SARS-CoV-2) nucleic acids may be present in the submitted sample  additional confirmatory testing may be necessary for epidemiological  and / or clinical management purposes  to differentiate between  SARS-CoV-2 and other Sarbecovirus currently known to infect humans.  If clinically indicated additional testing with an alternate test  methodology [phone]) is advised. The SARS-CoV-2 RNA is generally  detectable in upper and lower respiratory sp ecimens during the acute  phase of infection. The expected result is Negative. Fact Sheet for Patients:  StrictlyIdeas.no Fact Sheet for Healthcare Providers: BankingDealers.co.za This test is not yet approved or cleared by the [place] FDA and has been authorized for detection and/or diagnosis of  SARS-CoV-2 by FDA under an Emergency Use Authorization (EUA).  This EUA will remain in effect (meaning this test can be used) for the duration of the COVID-19 declaration under Section 564(b)(1) of the Act, 21 U.S.C. section 360bbb-3(b)(1), unless the authorization is terminated or revoked sooner. Performed at [facility], [place] [address] [place], [place] [address]   Brain natriuretic peptide (order if patient c/o SOB ONLY)     Status: None   Collection Time: 07/12/18  2:17 PM  Result Value Ref Range   B Natriuretic Peptide 18.3 0.0 - 100.0 pg/mL    Comment: Performed at [facility] [address] [place], [place] [address]   Dg Chest Portable 1 View  Result Date: 07/12/2018 CLINICAL DATA:  Short of breath EXAM: PORTABLE CHEST 1 VIEW COMPARISON:  04/30/2015 FINDINGS: Normal mediastinum and cardiac silhouette. Normal pulmonary vasculature. No evidence of effusion, infiltrate, or pneumothorax. No acute bony abnormality. IMPRESSION: No acute cardiopulmonary process. Electronically Signed   By: Suzy Bouchard M.D.   On: 07/12/2018 14:33    Pending Labs Unresulted Labs (From admission, onward)    Start     Ordered   07/12/18 1411  Troponin I - Now Then Q3H  Now then every 3 hours,   STAT     07/12/18 1411   Signed and Held  HIV antibody (Routine Testing)  Once,   R     Signed and Held   Signed and Held  CBC  (heparin)  Once,   R    Comments:  Baseline for heparin therapy IF NOT ALREADY DRAWN.  Notify MD if PLT < 100 K.    Signed and Held   Signed and Held  Creatinine, serum  (heparin)  Once,   R    Comments:  Baseline for heparin therapy IF NOT ALREADY DRAWN.    Signed and Held   Signed and Held  Troponin I - Now Then Q6H  Now then every 6 hours,   STAT     Signed and Held   Signed and Held  Basic metabolic panel  Tomorrow morning,   R     Signed and Held  Signed and Held  Lipid panel  Tomorrow morning,   R     Signed and Held   Signed and Held  CBC  Tomorrow  morning,   R     Signed and Held          Vitals/Pain Today's Vitals   07/12/18 1430 07/12/18 1500 07/12/18 1530 07/12/18 1615  BP: (!) 159/94 (!) 157/80 (!) 150/79   Pulse: (!) 55 (!) 50 (!) 48 (!) 49  Resp: 15 16 15 13   Temp:      TempSrc:      SpO2: 98% 98% 97% 98%  Weight:      Height:      PainSc:        Isolation Precautions No active isolations  Medications Medications  lisinopril (ZESTRIL) tablet 20 mg (has no administration in time range)  amLODipine (NORVASC) tablet 5 mg (has no administration in time range)  aspirin chewable tablet 324 mg (324 mg Oral Given 07/12/18 1418)    Mobility walks Low fall risk   Focused Assessments Cardiac Assessment Handoff:  Cardiac Rhythm: Normal sinus rhythm Lab Results  Component Value Date   TROPONINI <0.03 07/12/2018   Lab Results  Component Value Date   DDIMER 0.65 (H) 07/12/2018   Does the Patient currently have chest pain? No     R Recommendations: See Admitting Provider Note  Report given to:   Additional Notes:

## 2018-07-12 NOTE — Telephone Encounter (Signed)
° °  Spouse calling to report  She is taking patient to the ED today

## 2018-07-12 NOTE — ED Triage Notes (Signed)
Pt reports intermittent chest pain since march that radiates to his neck and intermittent SOB. Pt called cardiologist office yesterday and was advised to come here. Pt a.o, nad noted.

## 2018-07-12 NOTE — Telephone Encounter (Signed)
Pt. Wife called cardiologist 07/11/2018 and they recommended him go to ED. Clinic RN called and spoke with patient wife. Per wife patient still refused to go ED last night as recommended by cardiologist. Per wife patient is still having some pressure and tightness in chest. Strongly advised wife he needs to go to ED immediately. Per wife he verbalizes if he is not feeling better by this afternoon he will go.

## 2018-07-12 NOTE — ED Notes (Signed)
CBG 117 

## 2018-07-12 NOTE — Telephone Encounter (Signed)
Agree with advice for ED.  He should not delay with his cardiac hx.

## 2018-07-12 NOTE — Telephone Encounter (Signed)
Per chart pt not seen/evaluated in any local ED.

## 2018-07-12 NOTE — ED Provider Notes (Signed)
[facility] EMERGENCY DEPARTMENT Provider Note   CSN: [id number] Arrival date & time: 07/12/18  1359    History   Chief Complaint Chief Complaint  Patient presents with  . Chest Pain    HPI Terry Rivers is a 68 y.o. male.  HPI: A 68 year old patient with a history of treated diabetes, hypertension and hypercholesterolemia presents for evaluation of chest pain. Initial onset of pain was more than 6 hours ago. The patient's chest pain is sharp and is worse with exertion. The patient's chest pain is not middle- or left-sided, is not well-localized, is not described as heaviness/pressure/tightness and does not radiate to the arms/jaw/neck. The patient does not complain of nausea and denies diaphoresis. The patient has no history of stroke, has no history of peripheral artery disease, has not smoked in the past 90 days, has no relevant family history of coronary artery disease (first degree relative at less than age 31) and does not have an elevated BMI (>=30).   HPI Patient presents to the emergency room for evaluation of chest pain.  Patient has been having intermittent episodes of chest pain for the last couple of months.  Patient states his been a sharp pain in his chest that sometimes radiates to his neck.  Notes in the chart however indicate that he previously told his wife that it was a tightness/twisting discomfort.  He will get short of breath as well when this occurs.  It has been occurring with activity but also at rest. He has noticed discomfort when doing gardening yardwork and also has had episodes at night.  He denies any fevers or chills.  No coughing.  No vomiting or diarrhea.  Patient does have a history of heart disease and had a heart attack back in 2017.  He has had cardiac stents placed.  Patient called the cardiology office yesterday and was instructed to come to the ED. Past Medical History:  Diagnosis Date  . Arthritis   . Benign essential HTN   .  Bradycardia    low 50"s per wife  . CAD, multiple vessel-native arteries, emergent PCI RCA with DES 04/15/15 2017   a. 03/2014- inferior STEMI with occluded RCA; b. 04/2015: Staged PCI of LCx  . Chicken pox   . Diabetes mellitus without complication (HCC)    Borderline/no meds  . Frequent headaches   . GERD (gastroesophageal reflux disease)   . Hyperlipidemia LDL goal <70 04/17/2015  . Myocardial infarction ([place]) 03/2015  . Tobacco use 04/17/2015    Patient Active Problem List   Diagnosis Date Noted  . CAD (coronary artery disease) 05/06/2015  . Tobacco use 04/17/2015  . Hyperlipidemia LDL goal <70 04/17/2015  . CAD, multiple vessel-native arteries, emergent PCI RCA with DES 04/15/15 04/17/2015  . Benign essential HTN 04/17/2015  . ST elevation (STEMI) myocardial infarction involving right coronary artery ([place]) 04/15/2015  . Bradycardia   . SORE THROAT 09/11/2009  . ELEVATED BLOOD PRESSURE 09/11/2009  . VIRAL URI 02/28/2009    Past Surgical History:  Procedure Laterality Date  . CARDIAC CATHETERIZATION N/A 04/15/2015   Procedure: Left Heart Cath and Coronary Angiography;  Surgeon: Peter M [name], MD;  Location: [facility];  Service: Cardiovascular;  Laterality: N/A;  . CARDIAC CATHETERIZATION N/A 04/15/2015   Procedure: Coronary Stent Intervention;  Surgeon: Peter M [name], MD;  Location: [facility];  Service: Cardiovascular;  Laterality: N/A;  . CARDIAC CATHETERIZATION N/A 05/06/2015   Procedure: Coronary Stent Intervention;  Surgeon: Peter M [name], MD;  Location: [facility];  Service: Cardiovascular;  Laterality: N/A;  . CARDIAC CATHETERIZATION  05/06/2015   Procedure: Coronary/Graft Angiography;  Surgeon: Peter M [name], MD;  Location: [facility];  Service: Cardiovascular;;  . COLONOSCOPY    . CORONARY STENT PLACEMENT  05/06/2015   LT CIRC DES X2  . POLYPECTOMY    . SKIN CANCER EXCISION     right forearm/pre-cancerous/ 2 spots on left arm         Home Medications    Prior to Admission medications   Medication Sig Start Date End Date Taking? Authorizing Provider  acetaminophen (TYLENOL) 325 MG tablet Take 650 mg by mouth every 6 (six) hours as needed for mild pain.    [provider]  aspirin 81 MG chewable tablet Chew 1 tablet (81 mg total) by mouth daily. 04/17/15   Isaiah Serge, NP  atorvastatin (LIPITOR) 80 MG tablet TAKE 1 TABLET (80 MG TOTAL) BY MOUTH DAILY AT 6 PM. 12/23/17   [name], Peter M, MD  lisinopril (PRINIVIL,ZESTRIL) 10 MG tablet TAKE 1 TABLET BY MOUTH EVERY DAY 12/23/17   [name], Peter M, MD  nitroGLYCERIN (NITROSTAT) 0.4 MG SL tablet Place 1 tablet (0.4 mg total) under the tongue every 5 (five) minutes x 3 doses as needed for chest pain. 03/31/17   [name], Peter M, MD  pantoprazole (PROTONIX) 40 MG tablet TAKE 1 TABLET BY MOUTH EVERY DAY 12/20/17   [name], Peter M, MD  triamcinolone cream (KENALOG) 0.1 % Apply 1 application topically 2 (two) times daily. 03/28/18   Burchette, Alinda Sierras, MD    Family History Family History  Problem Relation Age of Onset  . Stroke Mother   . Cancer Father        colon, prostate  . Colon cancer Father   . Cancer Brother        lung  . Colon cancer Brother   . Kidney cancer Brother   . Kidney cancer Sister   . Brain cancer Sister   . Kidney cancer Sister   . Kidney cancer Sister   . Kidney cancer Sister     Social History Social History   Tobacco Use  . Smoking status: Never Smoker  . Smokeless tobacco: Current User    Types: Chew  . Tobacco comment: chew every day  Substance Use Topics  . Alcohol use: No    Alcohol/week: 0.0 standard drinks  . Drug use: No     Allergies   Penicillins and Prednisone   Review of Systems Review of Systems  All other systems reviewed and are negative.    Physical Exam Updated Vital Signs BP (!) 159/94   Pulse (!) 55   Temp 98.1 F (36.7 C) (Oral)   Resp 15   Ht 1.651 m (5\' 5" )   Wt 72.1 kg   SpO2 98%   BMI  26.46 kg/m   Physical Exam Vitals signs and nursing note reviewed.  Constitutional:      General: He is not in acute distress.    Appearance: He is well-developed.  HENT:     Head: Normocephalic and atraumatic.     Right Ear: External ear normal.     Left Ear: External ear normal.  Eyes:     General: No scleral icterus.       Right eye: No discharge.        Left eye: No discharge.     Conjunctiva/sclera: Conjunctivae normal.  Neck:     Musculoskeletal: Neck supple.     Trachea: No tracheal deviation.  Cardiovascular:     Rate and Rhythm: Normal rate and regular rhythm.  Pulmonary:     Effort: Pulmonary effort is normal. No respiratory distress.     Breath sounds: Normal breath sounds. No stridor. No wheezing or rales.  Abdominal:     General: Bowel sounds are normal. There is no distension.     Palpations: Abdomen is soft.     Tenderness: There is no abdominal tenderness. There is no guarding or rebound.  Musculoskeletal:        General: No tenderness.  Skin:    General: Skin is warm and dry.     Findings: No rash.  Neurological:     Mental Status: He is alert.     Cranial Nerves: No cranial nerve deficit (no facial droop, extraocular movements intact, no slurred speech).     Sensory: No sensory deficit.     Motor: No abnormal muscle tone or seizure activity.     Coordination: Coordination normal.      ED Treatments / Results  Labs (all labs ordered are listed, but only abnormal results are displayed) Labs Reviewed  BASIC METABOLIC PANEL - Abnormal; Notable for the following components:      Result Value   Potassium 3.4 (*)    CO2 21 (*)    Glucose, Bld 135 (*)    BUN 6 (*)    All other components within normal limits  D-DIMER, QUANTITATIVE (NOT AT [facility]) - Abnormal; Notable for the following components:   D-Dimer, Quant 0.65 (*)    All other components within normal limits  CBG MONITORING, ED - Abnormal; Notable for the following components:    Glucose-Capillary 117 (*)    All other components within normal limits  SARS CORONAVIRUS 2 (HOSPITAL ORDER, [facility])  TROPONIN I  CBC  BRAIN NATRIURETIC PEPTIDE  TROPONIN I    EKG EKG Interpretation  Date/Time:  Wednesday Jul 12 2018 14:08:59 EDT Ventricular Rate:  62 PR Interval:    QRS Duration: 105 QT Interval:  401 QTC Calculation: 408 R Axis:   21 Text Interpretation:  Sinus rhythm Consider left atrial enlargement Probable LVH with secondary repol abnrm inferior t waves changes decreased since last tracing Confirmed by Dorie Rank [phone]) on 07/12/2018 2:17:09 PM   Radiology Dg Chest Portable 1 View  Result Date: 07/12/2018 CLINICAL DATA:  Short of breath EXAM: PORTABLE CHEST 1 VIEW COMPARISON:  04/30/2015 FINDINGS: Normal mediastinum and cardiac silhouette. Normal pulmonary vasculature. No evidence of effusion, infiltrate, or pneumothorax. No acute bony abnormality. IMPRESSION: No acute cardiopulmonary process. Electronically Signed   By: Suzy Bouchard M.D.   On: 07/12/2018 14:33    Procedures Procedures (including critical care time)  Medications Ordered in ED Medications  aspirin chewable tablet 324 mg (324 mg Oral Given 07/12/18 1418)     Initial Impression / Assessment and Plan / ED Course  I have reviewed the triage vital signs and the nursing notes.  Pertinent labs & imaging results that were available during my care of the patient were reviewed by me and considered in my medical decision making (see chart for details).  Clinical Course as of Jul 12 1543  Wed Jul 12, 2018  1515 Patient's d-dimer is slightly elevated at 0.65 but this is within normal age-adjusted range.   [JK]    Clinical Course User Index [JK] Dorie Rank, MD  HEAR Score: 5  Patient presents ED for evaluation of chest pain.  Patient does have a history of heart disease.  He had an MI previously and has cardiac stents.  Patient has been having some  intermittent discomfort over the last couple of months.  This is the first time the patient is getting waited for it.  He has some atypical features to his pain however he does have some symptoms that are more concerning such as the discomfort with activity and exertion.  He is at moderate risk heart score.  I will consult with cardiology for their evaluation and further recommendations  Final Clinical Impressions(s) / ED Diagnoses   Final diagnoses:  Chest pain, unspecified type      Dorie Rank, MD 07/12/18 1545

## 2018-07-12 NOTE — H&P (Addendum)
History & Physical    Patient ID: Terry Rivers MRN: [id number], DOB/AGE: [birth year]   Admit date: 07/12/2018  Primary Physician: [name], MD Primary Cardiologist: Dr. Peter [name], MD   Patient Profile    Terry Rivers is a 68 y.o. male with a hx of CAD s/p PCI to RCA; staged PCI to P LCx 04/2015, bradycardia, DM2 (diet controlled) and DJD who presented to [facility] on 07/12/2018 with chest pain and SOB.  Past Medical History   Past Medical History:  Diagnosis Date  . Arthritis   . Benign essential HTN   . Bradycardia    low 50"s per wife  . CAD, multiple vessel-native arteries, emergent PCI RCA with DES 04/15/15 2017   a. 03/2014- inferior STEMI with occluded RCA; b. 04/2015: Staged PCI of LCx  . Chicken pox   . Diabetes mellitus without complication (HCC)    Borderline/no meds  . Frequent headaches   . GERD (gastroesophageal reflux disease)   . Hyperlipidemia LDL goal <70 04/17/2015  . Myocardial infarction ([place]) 03/2015  . Tobacco use 04/17/2015    Past Surgical History:  Procedure Laterality Date  . CARDIAC CATHETERIZATION N/A 04/15/2015   Procedure: Left Heart Cath and Coronary Angiography;  Surgeon: Peter M [name], MD;  Location: [place] [facility];  Service: Cardiovascular;  Laterality: N/A;  . CARDIAC CATHETERIZATION N/A 04/15/2015   Procedure: Coronary Stent Intervention;  Surgeon: Peter M [name], MD;  Location: [place] [facility];  Service: Cardiovascular;  Laterality: N/A;  . CARDIAC CATHETERIZATION N/A 05/06/2015   Procedure: Coronary Stent Intervention;  Surgeon: Peter M [name], MD;  Location: [place] [facility];  Service: Cardiovascular;  Laterality: N/A;  . CARDIAC CATHETERIZATION  05/06/2015   Procedure: Coronary/Graft Angiography;  Surgeon: Peter M [name], MD;  Location: [place] [facility];  Service: Cardiovascular;;  . COLONOSCOPY    . CORONARY STENT PLACEMENT  05/06/2015   LT CIRC DES X2  . POLYPECTOMY    . SKIN CANCER EXCISION     right  forearm/pre-cancerous/ 2 spots on left arm    Allergies  Allergies  Allergen Reactions  . Penicillins Anaphylaxis    REACTION: joint swelling, breathing issues    . Prednisone Swelling    REACTION: joint swelling, could not walk   History of Present Illness    Mr. Terry Rivers is a 68 year old male with a history stated above who presented to [facility] on 07/12/2018 with intermittent episodes of chest pain and SOB over the last several months. Patient states that back in March he began to notice SOB and exertional chest pain that would come on when he would work out in his yard which was relieved with rest. More recently he has noticed that the episodes are happening more frequently. He has also noticed that the SOB will worsen with lying flat but has had no LE swelling or weight gain. He denies pain radiation and has not had resting chest pain. He has not taken NTG for his symptoms. He denies nausea and vomiting however reports some diaphoresis with the orthopnea. Given that this has been ongoing, he initially called our office for advice and was told to come to the ED but he waited until today to arrive. He is currently chest pain free and resting comfortably. He does state that his symptoms are very similar to his prior MI in 2017. He has been compliant with his medications and denies tobacco, alcohol or illicit drug use.   In the ED,  initial troponin is negative at <0.03.  Potassium was found to be slightly low at 3.4.  Creatinine stable at 0.08.  D-dimer was elevated at 0.65.  COVID testing, negative.  CXR negative for acute cardiopulmonary process.  BNP 18.3.  EKG with NSR with TWI in inferior leads and no acute ischemic changes, similar to prior tracing from 10/13/2017.   Mr. Terry Rivers has a history of inferior STEMI during a hospitalization 04/15/2015- 04/17/2015. LHC demonstrated multivessel CAD with occluded RCA as the culprit. RCA was treated with DES.  He has residual LAD/Dx and LCx disease. He was not  started on beta-blocker due to bradycardia. On 05/06/15 he underwent staged PCI of the proximal to mid LCx with a DES. The mid LAD had a 60% stenosis with FFR of 0.9 so will be treated medically.  He was last seen in follow-up by Dr. [name] on 10/13/2017.  He was doing well at that time.  Continued to do heavy yard work and gardening without complication.  Denied anginal symptoms at rest or with exertion as well as DOE. He was continued on ASA and statin.  Noted to not be on a beta-blocker secondary to history of bradycardia.  Cardiology was asked to admit.   Home Medications    Prior to Admission medications   Medication Sig Start Date End Date Taking? Authorizing Provider  acetaminophen (TYLENOL) 325 MG tablet Take 650 mg by mouth every 6 (six) hours as needed for mild pain.    [provider]  aspirin 81 MG chewable tablet Chew 1 tablet (81 mg total) by mouth daily. 04/17/15   Isaiah Serge, NP  atorvastatin (LIPITOR) 80 MG tablet TAKE 1 TABLET (80 MG TOTAL) BY MOUTH DAILY AT 6 PM. 12/23/17   [name], Peter M, MD  lisinopril (PRINIVIL,ZESTRIL) 10 MG tablet TAKE 1 TABLET BY MOUTH EVERY DAY 12/23/17   [name], Peter M, MD  nitroGLYCERIN (NITROSTAT) 0.4 MG SL tablet Place 1 tablet (0.4 mg total) under the tongue every 5 (five) minutes x 3 doses as needed for chest pain. 03/31/17   [name], Peter M, MD  pantoprazole (PROTONIX) 40 MG tablet TAKE 1 TABLET BY MOUTH EVERY DAY 12/20/17   [name], Peter M, MD  triamcinolone cream (KENALOG) 0.1 % Apply 1 application topically 2 (two) times daily. 03/28/18   Burchette, Alinda Sierras, MD    Family History    Family History  Problem Relation Age of Onset  . Stroke Mother   . Cancer Father        colon, prostate  . Colon cancer Father   . Cancer Brother        lung  . Colon cancer Brother   . Kidney cancer Brother   . Kidney cancer Sister   . Brain cancer Sister   . Kidney cancer Sister   . Kidney cancer Sister   . Kidney cancer Sister     Social  History    Social History   Socioeconomic History  . Marital status: Married    Spouse name: Not on file  . Number of children: Not on file  . Years of education: Not on file  . Highest education level: Not on file  Occupational History  . Not on file  Social Needs  . Financial resource strain: Not on file  . Food insecurity:    Worry: Not on file    Inability: Not on file  . Transportation needs:    Medical: Not on file    Non-medical: Not  on file  Tobacco Use  . Smoking status: Never Smoker  . Smokeless tobacco: Current User    Types: Chew  . Tobacco comment: chew every day  Substance and Sexual Activity  . Alcohol use: No    Alcohol/week: 0.0 standard drinks  . Drug use: No  . Sexual activity: Not on file  Lifestyle  . Physical activity:    Days per week: Not on file    Minutes per session: Not on file  . Stress: Not on file  Relationships  . Social connections:    Talks on phone: Not on file    Gets together: Not on file    Attends religious service: Not on file    Active member of club or organization: Not on file    Attends meetings of clubs or organizations: Not on file    Relationship status: Not on file  . Intimate partner violence:    Fear of current or ex partner: Not on file    Emotionally abused: Not on file    Physically abused: Not on file    Forced sexual activity: Not on file  Other Topics Concern  . Not on file  Social History Narrative  . Not on file     Review of Systems   See HPI  All other systems reviewed and are otherwise negative except as noted above.  Physical Exam    Blood pressure (!) 159/94, pulse (!) 55, temperature 98.1 F (36.7 C), temperature source Oral, resp. rate 15, height 5\' 5"  (1.651 m), weight 72.1 kg, SpO2 98 %.   General: Well developed, well nourished, NAD Skin: Warm, dry, intact  Head: Normocephalic, atraumatic, clear, moist mucus membranes. Neck: Negative for carotid bruits. No JVD Lungs:Diminished in  bilateral bases. No wheezes, rales, or rhonchi. Breathing is unlabored. Cardiovascular: RRR with S1 S2. No murmurs, rubs, gallops, or LV heave appreciated. Abdomen: Soft, non-tender, distended. No obvious abdominal masses. MSK: Strength and tone appear normal for age. 5/5 in all extremities Extremities: No edema. No clubbing or cyanosis. DP/PT pulses 2+ bilaterally Neuro: Alert and oriented. No focal deficits. No facial asymmetry. MAE spontaneously. Psych: Responds to questions appropriately with normal affect.    Labs    Troponin (Point of Care Test) No results for input(s): TROPIPOC in the last 72 hours. Recent Labs    07/12/18 1417  TROPONINI <0.03   Lab Results  Component Value Date   WBC 8.7 07/12/2018   HGB 14.6 07/12/2018   HCT 42.1 07/12/2018   MCV 88.8 07/12/2018   PLT 195 07/12/2018    Recent Labs  Lab 07/12/18 1417  NA 141  K 3.4*  CL 108  CO2 21*  BUN 6*  CREATININE 0.80  CALCIUM 9.1  GLUCOSE 135*   Lab Results  Component Value Date   CHOL 95 (L) 10/13/2017   HDL 22 (L) 10/13/2017   LDLCALC 48 10/13/2017   TRIG 124 10/13/2017   Lab Results  Component Value Date   DDIMER 0.65 (H) 07/12/2018     Radiology Studies    Dg Chest Portable 1 View  Result Date: 07/12/2018 CLINICAL DATA:  Short of breath EXAM: PORTABLE CHEST 1 VIEW COMPARISON:  04/30/2015 FINDINGS: Normal mediastinum and cardiac silhouette. Normal pulmonary vasculature. No evidence of effusion, infiltrate, or pneumothorax. No acute bony abnormality. IMPRESSION: No acute cardiopulmonary process. Electronically Signed   By: Suzy Bouchard M.D.   On: 07/12/2018 14:33   ECG & Cardiac Imaging    07/12/2018: EKG: NSR  with TWI in inferior leads, similar to prior tracing from 10/13/2017  Coronary stent intervention 05/06/2015:   1st Diag lesion, 50% stenosed.  Prox Cx to Mid Cx lesion, 80% stenosed. Post intervention, there is a 0% residual stenosis.  Prox LAD to Mid LAD lesion, 60%  stenosed.   1. Borderline LAD disease with normal FFR of 0.9 indicating that this is not hemodynamically significant. 2. Successful stenting of the proximal LCx with DES x 2.  Plan: anticipate DC in am. Continue DAPT for one year.   Cath 04/15/2015:   Prox LAD to Mid LAD lesion, 70% stenosed.  1st Diag lesion, 75% stenosed.  Prox Cx to Mid Cx lesion, 80% stenosed.  Prox RCA lesion, 100% stenosed. Post intervention, there is a 0% residual stenosis.   1. 3 vessel obstructive CAD 2. Culprit lesion in a large RCA 3. Normal LV EDP 4. Successful stenting of the proximal to mid RCA with a DES.   Plan: DAPT for one year. Will assess LV function with an Echo. Statin therapy. Avoid beta blocker given history of bradycardia. I would recommend staged PCI of the LCx, diagonal, and possibly the LAD (consider FFR). Given the complexity of disease I would favor doing this at a later date after he has recovered from his acute infarct- Perhaps in a few weeks depending on clinical course.    Echocardiogram 04/16/2015: Study Conclusions  - Left ventricle: The cavity size was normal. There was mild   concentric hypertrophy. Systolic function was normal. The   estimated ejection fraction was in the range of 50% to 55%. Wall   motion was normal; there were no regional wall motion   abnormalities. Doppler parameters are consistent with abnormal   left ventricular relaxation (grade 1 diastolic dysfunction). - Aortic valve: Transvalvular velocity was within the normal range.   There was no stenosis. There was mild regurgitation. - Mitral valve: There was no regurgitation. - Right ventricle: The cavity size was normal. Wall thickness was   normal. Systolic function was normal. - Tricuspid valve: There was no regurgitation.  Transthoracic echocardiography.  M-mode, complete 2D, spectral Doppler, and color Doppler.  Birthdate:  Patient birthdate: [birth year].  Age:  Patient is 68 yr old.  Sex:   Gender: male. BMI: 28.1 kg/m^2.  Blood pressure:     141/87  Patient status: Inpatient.  Study date:  Study date: 04/16/2015. Study time: 03:14 PM.  Location:  Echo laboratory.  Assessment & Plan    1. Unstable angina: -Pt presented with intermittent episodes of chest pain and SOB over the last several months. Patient states that back in March he began to notice SOB and exertional chest pain that would come on when he would work out in his yard which was relieved with rest. More recently he has noticed that the episodes are happening more frequently. He has also noticed that the SOB will worsen with lying flat but has had no LE swelling or weight gain. He denies pain radiation and has not had resting chest pain. He has not taken NTG for his symptoms. He denies nausea and vomiting however reports some diaphoresis with the orthopnea. -Last cath with PCI to RCA; staged PCI to P LCx 04/2015 -Last echocardiogram from 2017 with LVEF of 50-55% with NWMA and G1DD -EKG with no acute changes, similar to prior tracing from 09/2017 -Trop, negative at <0.03, continue to cycle  -CXR without cardiopulmonary disease -Creatinine stable at 0.80 -Given prior hx and presenting symptoms of unstable angina similar  to previous anginal symptoms, will plan for cardiac cath to further assess coronary anatomy  -Continue ASA, statin   2. DM2: -Diet controlled  -HbA1c, 5.8 on 04/16/2015  3. HLD: -Last LDL, 48 on 10/13/2017 -Continue statin   4. HTN: -Elevated, 150/79>157/80>159/94  -Will increase lisinopril to 20 and add low dose amlodipine  -No BB secondary to bradycardia -Consider losartan for BP control and titrate as tolerated    Severity of Illness: The appropriate patient status for this patient is OBSERVATION. Observation status is judged to be reasonable and necessary in order to provide the required intensity of service to ensure the patient's safety. The patient's presenting symptoms, physical exam  findings, and initial radiographic and laboratory data in the context of their medical condition is felt to place them at decreased risk for further clinical deterioration. Furthermore, it is anticipated that the patient will be medically stable for discharge from the hospital within 2 midnights of admission. The following factors support the patient status of observation.   " The patient's presenting symptoms include chest pain . " The physical exam findings include chest pain . " The initial radiographic and laboratory data are troponin levels.    Signed, Kathyrn Drown NP-C [facility] Pager: [phone] 07/12/2018, @NOW

## 2018-07-12 NOTE — Telephone Encounter (Signed)
Pt seen at [facility] ED.  

## 2018-07-12 NOTE — Telephone Encounter (Signed)
Will monitor for ED arrival.  

## 2018-07-13 ENCOUNTER — Encounter (HOSPITAL_COMMUNITY): Payer: Self-pay | Admitting: Cardiovascular Disease

## 2018-07-13 ENCOUNTER — Observation Stay (HOSPITAL_BASED_OUTPATIENT_CLINIC_OR_DEPARTMENT_OTHER): Payer: Medicare Other

## 2018-07-13 ENCOUNTER — Encounter (HOSPITAL_COMMUNITY)
Admission: EM | Disposition: A | Discharge status: Home / Self Care | Payer: Self-pay | Source: Home / Self Care | Attending: Cardiology

## 2018-07-13 DIAGNOSIS — R0602 Shortness of breath: Secondary | ICD-10-CM

## 2018-07-13 DIAGNOSIS — I25110 Atherosclerotic heart disease of native coronary artery with unstable angina pectoris: Principal | ICD-10-CM

## 2018-07-13 DIAGNOSIS — Z823 Family history of stroke: Secondary | ICD-10-CM | POA: Diagnosis not present

## 2018-07-13 DIAGNOSIS — Z20828 Contact with and (suspected) exposure to other viral communicable diseases: Secondary | ICD-10-CM | POA: Diagnosis not present

## 2018-07-13 DIAGNOSIS — M1990 Unspecified osteoarthritis, unspecified site: Secondary | ICD-10-CM | POA: Diagnosis not present

## 2018-07-13 DIAGNOSIS — K219 Gastro-esophageal reflux disease without esophagitis: Secondary | ICD-10-CM | POA: Diagnosis not present

## 2018-07-13 DIAGNOSIS — Z800 Family history of malignant neoplasm of digestive organs: Secondary | ICD-10-CM | POA: Diagnosis not present

## 2018-07-13 DIAGNOSIS — I10 Essential (primary) hypertension: Secondary | ICD-10-CM | POA: Diagnosis not present

## 2018-07-13 DIAGNOSIS — E7800 Pure hypercholesterolemia, unspecified: Secondary | ICD-10-CM | POA: Diagnosis not present

## 2018-07-13 DIAGNOSIS — I252 Old myocardial infarction: Secondary | ICD-10-CM | POA: Diagnosis not present

## 2018-07-13 DIAGNOSIS — Z8051 Family history of malignant neoplasm of kidney: Secondary | ICD-10-CM | POA: Diagnosis not present

## 2018-07-13 DIAGNOSIS — E119 Type 2 diabetes mellitus without complications: Secondary | ICD-10-CM | POA: Diagnosis not present

## 2018-07-13 DIAGNOSIS — E785 Hyperlipidemia, unspecified: Secondary | ICD-10-CM | POA: Diagnosis not present

## 2018-07-13 HISTORY — PX: CORONARY STENT INTERVENTION: CATH118234

## 2018-07-13 HISTORY — PX: LEFT HEART CATH AND CORONARY ANGIOGRAPHY: CATH118249

## 2018-07-13 HISTORY — PX: CORONARY BALLOON ANGIOPLASTY: CATH118233

## 2018-07-13 LAB — BASIC METABOLIC PANEL
Anion gap: 11 (ref 5–15)
BUN: 9 mg/dL (ref 8–23)
CO2: 22 mmol/L (ref 22–32)
Calcium: 8.7 mg/dL — ABNORMAL LOW (ref 8.9–10.3)
Chloride: 108 mmol/L (ref 98–111)
Creatinine, Ser: 0.91 mg/dL (ref 0.61–1.24)
GFR calc Af Amer: >60 mL/min (ref >60)
GFR calc non Af Amer: >60 mL/min (ref >60)
Glucose, Bld: 110 mg/dL — ABNORMAL HIGH (ref 70–99)
Potassium: 4.1 mmol/L (ref 3.5–5.1)
Sodium: 141 mmol/L (ref 135–145)

## 2018-07-13 LAB — CBC
HCT: 42.7 % (ref 39.0–52.0)
Hemoglobin: 14.9 g/dL (ref 13.0–17.0)
MCH: 30.8 pg (ref 26.0–34.0)
MCHC: 34.9 g/dL (ref 30.0–36.0)
MCV: 88.4 fL (ref 80.0–100.0)
Platelets: 205 10*3/uL (ref 150–400)
RBC: 4.83 MIL/uL (ref 4.22–5.81)
RDW: 13 % (ref 11.5–15.5)
WBC: 10.4 10*3/uL (ref 4.0–10.5)
nRBC: 0 % (ref 0.0–0.2)

## 2018-07-13 LAB — LIPID PANEL
Cholesterol: 108 mg/dL (ref 0–200)
HDL: 22 mg/dL — ABNORMAL LOW (ref >40)
LDL Cholesterol: 39 mg/dL (ref 0–99)
Total CHOL/HDL Ratio: 4.9 RATIO
Triglycerides: 234 mg/dL — ABNORMAL HIGH (ref <150)
VLDL: 47 mg/dL — ABNORMAL HIGH (ref 0–40)

## 2018-07-13 LAB — HIV ANTIBODY (ROUTINE TESTING W REFLEX): HIV Screen 4th Generation wRfx: NONREACTIVE

## 2018-07-13 LAB — TROPONIN I
Troponin I: <0.03 ng/mL (ref <0.03)
Troponin I: <0.03 ng/mL (ref <0.03)

## 2018-07-13 LAB — POCT ACTIVATED CLOTTING TIME
Activated Clotting Time: 257 seconds
Activated Clotting Time: 274 seconds
Activated Clotting Time: 285 seconds

## 2018-07-13 LAB — ECHOCARDIOGRAM COMPLETE
Height: 65 in
Weight: 2550.4 oz

## 2018-07-13 SURGERY — LEFT HEART CATH AND CORONARY ANGIOGRAPHY
Anesthesia: LOCAL

## 2018-07-13 MED ORDER — HEPARIN (PORCINE) IN NACL 1000-0.9 UT/500ML-% IV SOLN
INTRAVENOUS | Status: AC
  Filled 2018-07-13: qty 1000

## 2018-07-13 MED ORDER — CLOPIDOGREL BISULFATE 300 MG PO TABS
ORAL_TABLET | ORAL | Status: AC
  Filled 2018-07-13: qty 2

## 2018-07-13 MED ORDER — CLOPIDOGREL BISULFATE 300 MG PO TABS
ORAL_TABLET | ORAL | Status: DC | PRN
  Administered 2018-07-13: 600 mg via ORAL

## 2018-07-13 MED ORDER — SODIUM CHLORIDE 0.9 % IV SOLN: 250.0000 mL | INTRAVENOUS | Status: DC | PRN

## 2018-07-13 MED ORDER — ASPIRIN 81 MG PO CHEW
81.0000 mg | CHEWABLE_TABLET | Freq: Every day | ORAL | Status: DC
  Administered 2018-07-14: 09:00:00 81 mg via ORAL
  Filled 2018-07-13: qty 1

## 2018-07-13 MED ORDER — SODIUM CHLORIDE 0.9% FLUSH: 3.0000 mL | INTRAVENOUS | Status: DC | PRN

## 2018-07-13 MED ORDER — SODIUM CHLORIDE 0.9 % IV SOLN: INTRAVENOUS | Status: AC

## 2018-07-13 MED ORDER — HEPARIN (PORCINE) IN NACL 1000-0.9 UT/500ML-% IV SOLN
INTRAVENOUS | Status: DC | PRN
  Administered 2018-07-13 (×2): 500 mL

## 2018-07-13 MED ORDER — CLOPIDOGREL BISULFATE 75 MG PO TABS
75.0000 mg | ORAL_TABLET | Freq: Every day | ORAL | Status: DC
  Administered 2018-07-14: 09:00:00 75 mg via ORAL
  Filled 2018-07-13: qty 1

## 2018-07-13 MED ORDER — LIDOCAINE HCL (PF) 1 % IJ SOLN
INTRAMUSCULAR | Status: DC | PRN
  Administered 2018-07-13: 2 mL

## 2018-07-13 MED ORDER — VERAPAMIL HCL 2.5 MG/ML IV SOLN
INTRA_ARTERIAL | Status: DC | PRN
  Administered 2018-07-13: 10:00:00 5 mL via INTRA_ARTERIAL

## 2018-07-13 MED ORDER — IOHEXOL 350 MG/ML SOLN
INTRAVENOUS | Status: DC | PRN
  Administered 2018-07-13: 175 mL via INTRACARDIAC

## 2018-07-13 MED ORDER — HYDRALAZINE HCL 20 MG/ML IJ SOLN: 10.0000 mg | INTRAMUSCULAR | Status: AC | PRN

## 2018-07-13 MED ORDER — FAMOTIDINE IN NACL 20-0.9 MG/50ML-% IV SOLN
INTRAVENOUS | Status: AC
  Filled 2018-07-13: qty 50

## 2018-07-13 MED ORDER — LIDOCAINE HCL (PF) 1 % IJ SOLN
INTRAMUSCULAR | Status: AC
  Filled 2018-07-13: qty 30

## 2018-07-13 MED ORDER — SODIUM CHLORIDE 0.9% FLUSH
3.0000 mL | Freq: Two times a day (BID) | INTRAVENOUS | Status: DC
  Administered 2018-07-13: 21:00:00 3 mL via INTRAVENOUS

## 2018-07-13 MED ORDER — NITROGLYCERIN 1 MG/10 ML FOR IR/CATH LAB
INTRA_ARTERIAL | Status: AC
  Filled 2018-07-13: qty 10

## 2018-07-13 MED ORDER — VERAPAMIL HCL 2.5 MG/ML IV SOLN
INTRAVENOUS | Status: AC
  Filled 2018-07-13: qty 2

## 2018-07-13 MED ORDER — ATORVASTATIN CALCIUM 80 MG PO TABS: 80.0000 mg | ORAL_TABLET | Freq: Every day | ORAL | Status: DC

## 2018-07-13 MED ORDER — LABETALOL HCL 5 MG/ML IV SOLN: 10.0000 mg | INTRAVENOUS | Status: AC | PRN

## 2018-07-13 MED ORDER — HEPARIN SODIUM (PORCINE) 1000 UNIT/ML IJ SOLN
INTRAMUSCULAR | Status: AC
  Filled 2018-07-13: qty 1

## 2018-07-13 MED ORDER — ONDANSETRON HCL 4 MG/2ML IJ SOLN: 4.0000 mg | Freq: Four times a day (QID) | INTRAMUSCULAR | Status: DC | PRN

## 2018-07-13 MED ORDER — MORPHINE SULFATE (PF) 2 MG/ML IV SOLN: 2.0000 mg | INTRAVENOUS | Status: DC | PRN

## 2018-07-13 MED ORDER — VERAPAMIL HCL 2.5 MG/ML IV SOLN: INTRA_ARTERIAL | Status: DC | PRN

## 2018-07-13 MED ORDER — FAMOTIDINE IN NACL 20-0.9 MG/50ML-% IV SOLN
INTRAVENOUS | Status: DC | PRN
  Administered 2018-07-13: 20 mg via INTRAVENOUS

## 2018-07-13 MED ORDER — ACETAMINOPHEN 325 MG PO TABS: 650.0000 mg | ORAL_TABLET | ORAL | Status: DC | PRN

## 2018-07-13 MED ORDER — HEPARIN SODIUM (PORCINE) 1000 UNIT/ML IJ SOLN
INTRAMUSCULAR | Status: DC | PRN
  Administered 2018-07-13: 3000 [IU] via INTRAVENOUS
  Administered 2018-07-13: 3500 [IU] via INTRAVENOUS
  Administered 2018-07-13: 6000 [IU] via INTRAVENOUS
  Administered 2018-07-13: 2000 [IU] via INTRAVENOUS

## 2018-07-13 SURGICAL SUPPLY — 23 items
BALLN EMERGE MR 2.0X12 (BALLOONS) ×4
BALLN ~~LOC~~ EMERGE MR 3.5X12 (BALLOONS) ×2
BALLOON EMERGE MR 2.0X12 (BALLOONS) ×2 IMPLANT
BALLOON ~~LOC~~ EMERGE MR 3.5X12 (BALLOONS) ×1 IMPLANT
CATH INFINITI 5FR ANG PIGTAIL (CATHETERS) ×2 IMPLANT
CATH INFINITI JR4 5F (CATHETERS) ×2 IMPLANT
CATH LAUNCHER 6FR AL.75 (CATHETERS) ×2 IMPLANT
CATH OPTITORQUE TIG 4.0 5F (CATHETERS) ×2 IMPLANT
DEVICE RAD COMP TR BAND LRG (VASCULAR PRODUCTS) ×2 IMPLANT
GLIDESHEATH SLEND A-KIT 6F 22G (SHEATH) ×2 IMPLANT
GUIDELINER 6F (CATHETERS) ×2 IMPLANT
GUIDEWIRE INQWIRE 1.5J.035X260 (WIRE) ×1 IMPLANT
INQWIRE 1.5J .035X260CM (WIRE) ×2
KIT ENCORE 26 ADVANTAGE (KITS) ×2 IMPLANT
KIT HEART LEFT (KITS) ×2 IMPLANT
PACK CARDIAC CATHETERIZATION (CUSTOM PROCEDURE TRAY) ×2 IMPLANT
STENT SYNERGY DES 3.5X12 (Permanent Stent) ×2 IMPLANT
SYR MEDRAD MARK 7 150ML (SYRINGE) IMPLANT
TRANSDUCER W/STOPCOCK (MISCELLANEOUS) ×2 IMPLANT
TUBING CIL FLEX 10 FLL-RA (TUBING) ×2 IMPLANT
WIRE ASAHI PROWATER 180CM (WIRE) ×2 IMPLANT
WIRE HI TORQ VERSACORE-J 145CM (WIRE) ×2 IMPLANT
WIRE HI TORQ WHISPER MS 190CM (WIRE) ×2 IMPLANT

## 2018-07-13 NOTE — Interval H&P Note (Signed)
Cath Lab Visit (complete for each Cath Lab visit)  Clinical Evaluation Leading to the Procedure:   ACS: Yes.    Non-ACS:    Anginal Classification: CCS III  Anti-ischemic medical therapy: Minimal Therapy (1 class of medications)  Non-Invasive Test Results: No non-invasive testing performed  Prior CABG: No previous CABG      History and Physical Interval Note:  07/13/2018 9:14 AM  Terry Rivers  has presented today for surgery, with the diagnosis of Unstable angina.  The various methods of treatment have been discussed with the patient and family. After consideration of risks, benefits and other options for treatment, the patient has consented to  Procedure(s): LEFT HEART CATH AND CORONARY ANGIOGRAPHY (N/A) as a surgical intervention.  The patient's history has been reviewed, patient examined, no change in status, stable for surgery.  I have reviewed the patient's chart and labs.  Questions were answered to the patient's satisfaction.     [name]

## 2018-07-13 NOTE — Progress Notes (Signed)
  Echocardiogram 2D Echocardiogram has been performed.  Bobbye Charleston 07/13/2018, 1:55 PM

## 2018-07-14 ENCOUNTER — Encounter (HOSPITAL_COMMUNITY): Payer: Self-pay | Admitting: Cardiology

## 2018-07-14 DIAGNOSIS — I252 Old myocardial infarction: Secondary | ICD-10-CM | POA: Diagnosis not present

## 2018-07-14 DIAGNOSIS — E119 Type 2 diabetes mellitus without complications: Secondary | ICD-10-CM | POA: Diagnosis present

## 2018-07-14 DIAGNOSIS — K219 Gastro-esophageal reflux disease without esophagitis: Secondary | ICD-10-CM | POA: Diagnosis present

## 2018-07-14 DIAGNOSIS — M1990 Unspecified osteoarthritis, unspecified site: Secondary | ICD-10-CM | POA: Diagnosis present

## 2018-07-14 DIAGNOSIS — E785 Hyperlipidemia, unspecified: Secondary | ICD-10-CM | POA: Diagnosis present

## 2018-07-14 DIAGNOSIS — I25110 Atherosclerotic heart disease of native coronary artery with unstable angina pectoris: Secondary | ICD-10-CM | POA: Diagnosis present

## 2018-07-14 DIAGNOSIS — E7800 Pure hypercholesterolemia, unspecified: Secondary | ICD-10-CM | POA: Diagnosis present

## 2018-07-14 DIAGNOSIS — Z888 Allergy status to other drugs, medicaments and biological substances status: Secondary | ICD-10-CM | POA: Diagnosis not present

## 2018-07-14 DIAGNOSIS — Z955 Presence of coronary angioplasty implant and graft: Secondary | ICD-10-CM | POA: Diagnosis not present

## 2018-07-14 DIAGNOSIS — Z823 Family history of stroke: Secondary | ICD-10-CM | POA: Diagnosis not present

## 2018-07-14 DIAGNOSIS — Z7982 Long term (current) use of aspirin: Secondary | ICD-10-CM | POA: Diagnosis not present

## 2018-07-14 DIAGNOSIS — Z800 Family history of malignant neoplasm of digestive organs: Secondary | ICD-10-CM | POA: Diagnosis not present

## 2018-07-14 DIAGNOSIS — I200 Unstable angina: Secondary | ICD-10-CM | POA: Diagnosis not present

## 2018-07-14 DIAGNOSIS — Z880 Allergy status to penicillin: Secondary | ICD-10-CM | POA: Diagnosis not present

## 2018-07-14 DIAGNOSIS — R079 Chest pain, unspecified: Secondary | ICD-10-CM | POA: Diagnosis not present

## 2018-07-14 DIAGNOSIS — Z20828 Contact with and (suspected) exposure to other viral communicable diseases: Secondary | ICD-10-CM | POA: Diagnosis present

## 2018-07-14 DIAGNOSIS — I10 Essential (primary) hypertension: Secondary | ICD-10-CM | POA: Diagnosis present

## 2018-07-14 DIAGNOSIS — Z808 Family history of malignant neoplasm of other organs or systems: Secondary | ICD-10-CM | POA: Diagnosis not present

## 2018-07-14 DIAGNOSIS — Z8051 Family history of malignant neoplasm of kidney: Secondary | ICD-10-CM | POA: Diagnosis not present

## 2018-07-14 LAB — BASIC METABOLIC PANEL
Anion gap: 8 (ref 5–15)
BUN: 6 mg/dL — ABNORMAL LOW (ref 8–23)
CO2: 24 mmol/L (ref 22–32)
Calcium: 8.5 mg/dL — ABNORMAL LOW (ref 8.9–10.3)
Chloride: 109 mmol/L (ref 98–111)
Creatinine, Ser: 0.77 mg/dL (ref 0.61–1.24)
GFR calc Af Amer: >60 mL/min (ref >60)
GFR calc non Af Amer: >60 mL/min (ref >60)
Glucose, Bld: 100 mg/dL — ABNORMAL HIGH (ref 70–99)
Potassium: 3.9 mmol/L (ref 3.5–5.1)
Sodium: 141 mmol/L (ref 135–145)

## 2018-07-14 LAB — CBC
HCT: 37.6 % — ABNORMAL LOW (ref 39.0–52.0)
Hemoglobin: 13 g/dL (ref 13.0–17.0)
MCH: 31 pg (ref 26.0–34.0)
MCHC: 34.6 g/dL (ref 30.0–36.0)
MCV: 89.5 fL (ref 80.0–100.0)
Platelets: 189 10*3/uL (ref 150–400)
RBC: 4.2 MIL/uL — ABNORMAL LOW (ref 4.22–5.81)
RDW: 13.2 % (ref 11.5–15.5)
WBC: 10.5 10*3/uL (ref 4.0–10.5)
nRBC: 0 % (ref 0.0–0.2)

## 2018-07-14 MED ORDER — AMLODIPINE BESYLATE 5 MG PO TABS: 5.0000 mg | ORAL_TABLET | Freq: Every day | ORAL | 3 refills | Status: DC

## 2018-07-14 MED ORDER — LISINOPRIL 20 MG PO TABS: 20.0000 mg | ORAL_TABLET | Freq: Every day | ORAL | 3 refills | Status: DC

## 2018-07-14 MED ORDER — CLOPIDOGREL BISULFATE 75 MG PO TABS: 75.0000 mg | ORAL_TABLET | Freq: Every day | ORAL | 2 refills | Status: DC

## 2018-07-14 MED FILL — AMLODIPINE BESYLATE 5 MG TA: 5 | 30 days supply | Qty: 30 | Fill #0

## 2018-07-14 MED FILL — LISINOPRIL 20 MG TABLET: 20 | 30 days supply | Qty: 30 | Fill #0

## 2018-07-14 MED FILL — CLOPIDOGREL 75 MG TABLET: 75 | 90 days supply | Qty: 90 | Fill #0

## 2018-07-14 NOTE — Progress Notes (Signed)
CARDIAC REHAB PHASE I   PRE:  Rate/Rhythm: 72 SR    BP: sitting 136/71    SaO2: 99 RA  MODE:  Ambulation: 520 ft   POST:  Rate/Rhythm: 88 SR    BP: sitting 155/76     SaO2:   Tolerated well, no c/o. Discussed stent, Plavix, restrictions, tobacco cessation, diet, exercising at home, NTG, and CRPII. Pt voiced understanding regarding Plavix. He is not very interested in quitting chewing tobacco, has been doing for 65 years. He sts he has cut back. Will refer to [place] but he probably will walk at home. Likes to tend to his garden. He needs new NTG rx. [id number]   [place], ACSM 07/14/2018 9:09 AM

## 2018-07-14 NOTE — Plan of Care (Signed)
  Problem: Education: Goal: Knowledge of General Education information will improve Description Including pain rating scale, medication(s)/side effects and non-pharmacologic comfort measures Outcome: Progressing   Problem: Clinical Measurements: Goal: Ability to maintain clinical measurements within normal limits will improve Outcome: Progressing Goal: Will remain free from infection Outcome: Progressing Goal: Cardiovascular complication will be avoided Outcome: Progressing   Problem: Activity: Goal: Ability to return to baseline activity level will improve Outcome: Progressing   Problem: Cardiovascular: Goal: Vascular access site(s) Level 0-1 will be maintained Outcome: Progressing   Problem: Health Behavior/Discharge Planning: Goal: Ability to safely manage health-related needs after discharge will improve Outcome: Progressing

## 2018-07-14 NOTE — Discharge Summary (Addendum)
Discharge Summary    Patient ID: Terry Rivers,  MRN: [id number], DOB/AGE: May 14, 1950 68 y.o.  Admit date: 07/12/2018 Discharge date: 07/14/2018  Primary Care Provider: Eulas Rivers Primary Cardiologist: Dr. [name]   Discharge Diagnoses    Principal Problem:   Unstable angina [facility]) Active Problems:   Tobacco use   Hyperlipidemia LDL goal <70   Essential hypertension   Allergies Allergies  Allergen Reactions  . Penicillins Anaphylaxis, Shortness Of Breath and Swelling    Joints became swollen /turned purple Did it involve swelling of the face/tongue/throat, SOB, or low BP? No Did it involve sudden or severe rash/hives, skin peeling, or any reaction on the inside of your mouth or nose? No Did you need to seek medical attention at a hospital or doctor's office? Yes When did it last happen? "Many years ago" If all above answers are "NO", may proceed with cephalosporin use.    . Prednisone Swelling and Other (See Comments)    Made the patient's joints swell to the point where he could not walk    Diagnostic Studies/Procedures    Cath: 07/13/18   Prox RCA to Mid RCA lesion is 90% stenosed.  Mid RCA lesion is 60% stenosed.  Previously placed Ost Cx to Mid Cx stent (unknown type) is widely patent.  Prox LAD lesion is 50% stenosed.  Ost 1st Diag to 1st Diag lesion is 50% stenosed.  Rivers intervention, there is a 25% residual stenosis.  A stent was successfully placed.  Rivers intervention, there is a 25% residual stenosis.  The left ventricular systolic function is normal.  LV end diastolic pressure is normal.  The left ventricular ejection fraction is 50-55% by visual estimate.   IMPRESSION: Successful balloon angioplasty of mid dominant RCA in-stent restenosis and restenting of the proximal RCA for high-grade in-stent restenosis with acceptable results.  The previously placed circumflex stent was widely open and the LAD had at most moderate disease  unchanged from his prior angiogram 3 years ago.  His LV function was normal with a low LVEDP.  He will need dual antiplatelet platelet therapy uninterrupted for at least 12 months.  Terry Rivers. MD, [facility]  TTE: 07/13/18  IMPRESSIONS    1. The left ventricle has normal systolic function with an ejection fraction of 60-65%. The cavity size was normal. Left ventricular diastolic Doppler parameters are consistent with impaired relaxation. No evidence of left ventricular regional wall  motion abnormalities.  2. The right ventricle has normal systolic function. The cavity was normal. There is no increase in right ventricular wall thickness.  _____________   History of Present Illness     68 year old male with a history stated above who presented to [facility] on 07/12/2018 with intermittent episodes of chest pain and SOB over the last several months. Patient stated that back in March he began to notice SOB and exertional chest pain that would come on when he would work out in his yard which was relieved with rest. More recently he had noticed that the episodes were happening more frequently. He had also noticed that the SOB will worsen with lying flat but has had no LE swelling or weight gain. He denied pain radiation and had not had resting chest pain. He had not taken NTG for his symptoms. He denied nausea and vomiting however reported some diaphoresis with the orthopnea. Given that this has been ongoing, he initially called our office for advice and was told to come to the ED. He did  state that his symptoms were very similar to his prior MI in 2017. He had been compliant with his medications and denied tobacco, alcohol or illicit drug use.   History of inferior STEMI during a hospitalization 04/15/2015- 04/17/2015. LHC demonstrated multivessel CAD with occluded RCA as the culprit. RCA was treated with DES. He had residual LAD/Dx and LCx disease. He was not started on beta-blocker due to bradycardia. On  05/06/15 he underwent staged PCI of the proximal to mid LCx with a DES. The mid LAD had a 60% stenosis with FFR of 0.9 so will be treated medically.  He was last seen in follow-up by Dr. [name] on 10/13/2017.  He was doing well at that time.  Continued to do heavy yard work and gardening without complication.  Denied anginal symptoms at rest or with exertion as well as DOE. He was continued on ASA and statin.  Noted to not be on a beta-blocker secondary to history of bradycardia.  In the ED, initial troponin is negative at <0.03.  Potassium was found to be slightly low at 3.4. Creatinine stable at 0.08.  D-dimer was elevated at 0.65.  COVID testing, negative.  CXR negative for acute cardiopulmonary process.  BNP 18.3.  EKG with NSR with TWI in inferior leads and no acute ischemic changes, similar to prior tracing from 10/13/2017.   He was admitted and taken for cardiac cath.   Hospital Course     Underwent cardiac cath noted above with PCI/DES x1 to [facility] for ISR. Patent stent in the LCx. LV function on LV gram was normal with low LVEDP. He was continued on DAPT with ASA/plavix for at least one year. On admission his blood pressure was elevated and increased his lisinopril from 10->20mg  daily with the addition of amlodipine 5mg  daily. Echo showed normal EF with no rWMA. He was continued on high dose statin. No complications noted Rivers cath. Morning labs were stable. Worked well with cardiac rehab without recurrent chest pain. No room to add BB 2/2 to bradycardia.  General: Well developed, well nourished, male appearing in no acute distress. Head: Normocephalic, atraumatic.  Neck: Supple without bruits, JVD. Lungs:  Resp regular and unlabored, CTA. Heart: RRR, S1, S2, no S3, S4, or murmur; no rub. Abdomen: Soft, non-tender, non-distended with normoactive bowel sounds. No hepatomegaly. No rebound/guarding. No obvious abdominal masses. Extremities: No clubbing, cyanosis, edema. Distal pedal pulses are  2+ bilaterally. Right radial cath site stable without bruising or hematoma Neuro: Alert and oriented X 3. Moves all extremities spontaneously. Psych: Normal affect.  Patty Sermons was seen by Dr. [name] and determined stable for discharge home. Follow up in the office has been arranged. Medications are listed below.   _____________  Discharge Vitals Blood pressure 138/77, pulse (!) 52, temperature (!) 97.5 F (36.4 C), temperature source Oral, resp. rate 20, height 5\' 5"  (1.651 m), weight 71.2 kg, SpO2 95 %.  Filed Weights   07/12/18 1739 07/13/18 0300 07/14/18 0409  Weight: 72.4 kg 72.3 kg 71.2 kg    Labs & Radiologic Studies    CBC Recent Labs    07/13/18 0513 07/14/18 0304  WBC 10.4 10.5  HGB 14.9 13.0  HCT 42.7 37.6*  MCV 88.4 89.5  PLT 205 993   Basic Metabolic Panel Recent Labs    07/13/18 0513 07/14/18 0304  NA 141 141  K 4.1 3.9  CL 108 109  CO2 22 24  GLUCOSE 110* 100*  BUN 9 6*  CREATININE 0.91 0.77  CALCIUM 8.7* 8.5*   Liver Function Tests No results for input(s): AST, ALT, ALKPHOS, BILITOT, PROT, ALBUMIN in the last 72 hours. No results for input(s): LIPASE, AMYLASE in the last 72 hours. Cardiac Enzymes Recent Labs    07/12/18 1758 07/12/18 2313 07/13/18 0513  TROPONINI <0.03 <0.03 <0.03   BNP Invalid input(s): POCBNP D-Dimer Recent Labs    07/12/18 1417  DDIMER 0.65*   Hemoglobin A1C No results for input(s): HGBA1C in the last 72 hours. Fasting Lipid Panel Recent Labs    07/13/18 0513  CHOL 108  HDL 22*  LDLCALC 39  TRIG 234*  CHOLHDL 4.9   Thyroid Function Tests No results for input(s): TSH, T4TOTAL, T3FREE, THYROIDAB in the last 72 hours.  Invalid input(s): FREET3 _____________  Dg Chest Portable 1 View  Result Date: 07/12/2018 CLINICAL DATA:  Short of breath EXAM: PORTABLE CHEST 1 VIEW COMPARISON:  04/30/2015 FINDINGS: Normal mediastinum and cardiac silhouette. Normal pulmonary vasculature. No evidence of effusion,  infiltrate, or pneumothorax. No acute bony abnormality. IMPRESSION: No acute cardiopulmonary process. Electronically Signed   By: Suzy Bouchard M.D.   On: 07/12/2018 14:33   Disposition   Pt is being discharged home today in good condition.  Follow-up Plans & Appointments    Follow-up Information    Lendon Colonel, NP Follow up on 08/24/2018.   Specialties:  Nurse Practitioner, Radiology, Cardiology Why:  at 9:45am for your follow up appt. This will be a virtual visit through your mobile phone.  Contact information: [address] [place] [address] [phone]          Discharge Instructions    Amb Referral to Cardiac Rehabilitation   Complete by:  As directed    Diagnosis:   PTCA Coronary Stents     After initial evaluation and assessments completed: Virtual Based Care may be provided alone or in conjunction with Phase 2 Cardiac Rehab based on patient barriers.:  Yes   Call MD for:  redness, tenderness, or signs of infection (pain, swelling, redness, odor or green/yellow discharge around incision site)   Complete by:  As directed    Diet - low sodium heart healthy   Complete by:  As directed    Discharge instructions   Complete by:  As directed    Radial Site Care Refer to this sheet in the next few weeks. These instructions provide you with information on caring for yourself after your procedure. Your caregiver may also give you more specific instructions. Your treatment has been planned according to current medical practices, but problems sometimes occur. Call your caregiver if you have any problems or questions after your procedure. HOME CARE INSTRUCTIONS You may shower the day after the procedure.Remove the bandage (dressing) and gently wash the site with plain soap and water.Gently pat the site dry.  Do not apply powder or lotion to the site.  Do not submerge the affected site in water for 3 to 5 days.  Inspect the site at least twice daily.   Do not flex or bend the affected arm for 24 hours.  No lifting over 5 pounds (2.3 kg) for 5 days after your procedure.  Do not drive home if you are discharged the same day of the procedure. Have someone else drive you.  You may drive 24 hours after the procedure unless otherwise instructed by your caregiver.  What to expect: Any bruising will usually fade within 1 to 2 weeks.  Blood that collects in the tissue (hematoma)  may be painful to the touch. It should usually decrease in size and tenderness within 1 to 2 weeks.  SEEK IMMEDIATE MEDICAL CARE IF: You have unusual pain at the radial site.  You have redness, warmth, swelling, or pain at the radial site.  You have drainage (other than a small amount of blood on the dressing).  You have chills.  You have a fever or persistent symptoms for more than 72 hours.  You have a fever and your symptoms suddenly get worse.  Your arm becomes pale, cool, tingly, or numb.  You have heavy bleeding from the site. Hold pressure on the site.   PLEASE DO NOT MISS ANY DOSES OF YOUR PLAVIX!!!!! Also keep a log of you blood pressures and bring back to your follow up appt. Please call the office with any questions.   Patients taking blood thinners should generally stay away from medicines like ibuprofen, Advil, Motrin, naproxen, and Aleve due to risk of stomach bleeding. You may take Tylenol as directed or talk to your primary doctor about alternatives.   Increase activity slowly   Complete by:  As directed        Discharge Medications     Medication List    TAKE these medications   acetaminophen 500 MG tablet Commonly known as:  TYLENOL Take 500-1,000 mg by mouth every 6 (six) hours as needed for mild pain or headache.   amLODipine 5 MG tablet Commonly known as:  NORVASC Take 1 tablet (5 mg total) by mouth daily.   aspirin 81 MG chewable tablet Chew 1 tablet (81 mg total) by mouth daily.   atorvastatin 80 MG tablet Commonly known as:   LIPITOR TAKE 1 TABLET (80 MG TOTAL) BY MOUTH DAILY AT 6 PM.   clopidogrel 75 MG tablet Commonly known as:  PLAVIX Take 1 tablet (75 mg total) by mouth daily with breakfast.   lisinopril 20 MG tablet Commonly known as:  ZESTRIL Take 1 tablet (20 mg total) by mouth daily. What changed:    medication strength  how much to take   nitroGLYCERIN 0.4 MG SL tablet Commonly known as:  NITROSTAT Place 1 tablet (0.4 mg total) under the tongue every 5 (five) minutes x 3 doses as needed for chest pain.   pantoprazole 40 MG tablet Commonly known as:  PROTONIX TAKE 1 TABLET BY MOUTH EVERY DAY What changed:  when to take this   triamcinolone cream 0.1 % Commonly known as:  KENALOG Apply 1 application topically 2 (two) times daily. What changed:    when to take this  reasons to take this        Acute coronary syndrome (MI, NSTEMI, STEMI, etc) this admission?: Yes.     AHA/ACC Clinical Performance & Quality Measures: 1. Aspirin prescribed? - Yes 2. ADP Receptor Inhibitor (Plavix/Clopidogrel, Brilinta/Ticagrelor or Effient/Prasugrel) prescribed (includes medically managed patients)? - Yes 3. Beta Blocker prescribed? - No - bradycardia 4. High Intensity Statin (Lipitor 40-80mg  or Crestor 20-40mg ) prescribed? - Yes 5. EF assessed during THIS hospitalization? - Yes 6. For EF <40%, was ACEI/ARB prescribed? - Not Applicable (EF >/= 27%) 7. For EF <40%, Aldosterone Antagonist (Spironolactone or Eplerenone) prescribed? - Not Applicable (EF >/= 78%) 8. Cardiac Rehab Phase II ordered (Included Medically managed Patients)? - Yes      Outstanding Labs/Studies   N/a   Duration of Discharge Encounter   Greater than 30 minutes including physician time.  Signed, Reino Bellis NP-C 07/14/2018, 9:50 AM Patient seen and examined  and history reviewed. Agree with above findings and plan. Patient feels well today. No chest pain or dyspnea. Lungs are clear. No gallop or murmur Radial site  without hematoma  Labs reviewed. Ecg is unchanged  Patient is still hypertensive but we have increased his lisinopril and added amlodipine.   He is stable for DC today with plans outlined above.   Carrington Olazabal Terry Rivers, [place] 07/14/2018 10:04 AM

## 2018-07-19 ENCOUNTER — Telehealth: Payer: Self-pay | Admitting: Adult Health

## 2018-07-19 NOTE — Telephone Encounter (Signed)
Spoke with pt wife, they do not have the technology for a virtual visit and would like top come to the office instead. Visit type changed.

## 2018-07-19 NOTE — Telephone Encounter (Signed)
New Message     Pts wife is calling and says the pt needs to be seen in the office, not a virtual visit    please call

## 2018-07-25 ENCOUNTER — Ambulatory Visit
Admission: RE | Admit: 2018-07-25 | Discharge: 2018-07-25 | Disposition: A | Discharge status: Home / Self Care | Payer: Medicare Other | Source: Ambulatory Visit | Attending: Adult Health | Admitting: Adult Health

## 2018-07-25 ENCOUNTER — Encounter: Payer: Self-pay | Admitting: Adult Health

## 2018-07-25 ENCOUNTER — Other Ambulatory Visit: Payer: Self-pay

## 2018-07-25 ENCOUNTER — Ambulatory Visit (INDEPENDENT_AMBULATORY_CARE_PROVIDER_SITE_OTHER): Payer: Medicare Other | Admitting: Adult Health

## 2018-07-25 VITALS — BP 116/70 | HR 54 | Temp 97.7°F | Ht 65.0 in | Wt 157.0 lb

## 2018-07-25 DIAGNOSIS — M25552 Pain in left hip: Secondary | ICD-10-CM

## 2018-07-25 DIAGNOSIS — M4727 Other spondylosis with radiculopathy, lumbosacral region: Secondary | ICD-10-CM | POA: Diagnosis not present

## 2018-07-25 DIAGNOSIS — I10 Essential (primary) hypertension: Secondary | ICD-10-CM

## 2018-07-25 DIAGNOSIS — M1612 Unilateral primary osteoarthritis, left hip: Secondary | ICD-10-CM | POA: Diagnosis not present

## 2018-07-25 DIAGNOSIS — I2510 Atherosclerotic heart disease of native coronary artery without angina pectoris: Secondary | ICD-10-CM

## 2018-07-25 DIAGNOSIS — E7800 Pure hypercholesterolemia, unspecified: Secondary | ICD-10-CM | POA: Diagnosis not present

## 2018-07-25 MED ORDER — CLOPIDOGREL BISULFATE 75 MG PO TABS: 75.0000 mg | ORAL_TABLET | Freq: Every day | ORAL | 3 refills | Status: DC

## 2018-07-25 MED ORDER — LISINOPRIL 20 MG PO TABS: 20.0000 mg | ORAL_TABLET | Freq: Every day | ORAL | 3 refills | Status: DC

## 2018-07-25 MED ORDER — AMLODIPINE BESYLATE 5 MG PO TABS: 5.0000 mg | ORAL_TABLET | Freq: Every day | ORAL | 3 refills | Status: DC

## 2018-07-25 MED ORDER — ATORVASTATIN CALCIUM 80 MG PO TABS: 80.0000 mg | ORAL_TABLET | Freq: Every day | ORAL | 3 refills | Status: DC

## 2018-07-25 NOTE — Progress Notes (Signed)
Cardiology Office Note   Date:  07/25/2018   ID:  Terry Rivers, DOB 1950/07/22, MRN [id number]  PCP:  [name], MD  Cardiologist:  Dr. [name]  CC: [place] hospital follow up   History of Present Illness: Terry Rivers is a 68 y.o. male who presents for post hospital follow up after admission on 07/12/2018 for complaints of chest pain and dyspnea. He has a history of inferior STEMI during hospitalization 04/15/2015-04/17/2015, with cardiac cath demonstrating multivessel disease with  occluded RCA as the culprit. RCA was treated with DES.  He had residual LAD/Dx and LCx disease. He was not started on beta-blocker due to bradycardia. On 05/06/15 he underwent staged PCI of the proximal to mid LCx with a DES. The mid LAD had a 60% stenosis with FFR of 0.9 so will be treated medically.  While in ED he was ruled out for ACS, and was negative for COVID-19. He had cardiac cath 07/13/2018 which revealed prox RCA to mid RCA lesion of 90%, mid RCA 60%. He had PCI/DES X 1 to [facility] for ISR. Patent stent in the LCx. He was continued on high dose statin. No BB due to bradycardia.   He comes today without further chest pain or DOE. He denies bleeding, or bruising. He is medically compliant.   His main complaint is Left hip pain with radiculopathy, weakness and some numbness. He has trouble bearing weight and sometimes uses his arms to support him. He has not fallen, lifted heavy object or twisted causing injury. He states he just woke up that way on Wednesday am, June 3rd. Pain is sometimes shooting pain and can occur while he is sitting as well. He is asking for pain medication.   Past Medical History:  Diagnosis Date  . Arthritis   . Benign essential HTN   . Bradycardia    low 50"s per wife  . CAD, multiple vessel-native arteries, emergent PCI RCA with DES 04/15/15 2017   a. 03/2014- inferior STEMI with occluded RCA; b. 04/2015: Staged PCI of LCx c. 07/13/18 PCI to [facility] for ISR  . Chicken pox   .  Diabetes mellitus without complication (HCC)    Borderline/no meds  . Frequent headaches   . GERD (gastroesophageal reflux disease)   . Hyperlipidemia LDL goal <70 04/17/2015  . Myocardial infarction ([place]) 03/2015  . Tobacco use 04/17/2015  . Unstable angina ([place]) 06/2018    Past Surgical History:  Procedure Laterality Date  . CARDIAC CATHETERIZATION N/A 04/15/2015   Procedure: Left Heart Cath and Coronary Angiography;  Surgeon: Terry M [name], MD;  Location: [place] CV LAB;  Service: Cardiovascular;  Laterality: N/A;  . CARDIAC CATHETERIZATION N/A 04/15/2015   Procedure: Coronary Stent Intervention;  Surgeon: Terry M [name], MD;  Location: [place] CV LAB;  Service: Cardiovascular;  Laterality: N/A;  . CARDIAC CATHETERIZATION N/A 05/06/2015   Procedure: Coronary Stent Intervention;  Surgeon: Terry M [name], MD;  Location: [place] CV LAB;  Service: Cardiovascular;  Laterality: N/A;  . CARDIAC CATHETERIZATION  05/06/2015   Procedure: Coronary/Graft Angiography;  Surgeon: Terry M [name], MD;  Location: [place] CV LAB;  Service: Cardiovascular;;  . COLONOSCOPY    . CORONARY BALLOON ANGIOPLASTY N/A 07/13/2018   Procedure: CORONARY BALLOON ANGIOPLASTY;  Surgeon: [name], MD;  Location: [place] CV LAB;  Service: Cardiovascular;  Laterality: N/A;  . CORONARY STENT INTERVENTION N/A 07/13/2018   Procedure: CORONARY STENT INTERVENTION;  Surgeon: [name], MD;  Location: [facility]  INVASIVE CV LAB;  Service: Cardiovascular;  Laterality: N/A;  . CORONARY STENT PLACEMENT  05/06/2015   LT CIRC DES X2  . LEFT HEART CATH AND CORONARY ANGIOGRAPHY N/A 07/13/2018   Procedure: LEFT HEART CATH AND CORONARY ANGIOGRAPHY;  Surgeon: [name], MD;  Location: [place] CV LAB;  Service: Cardiovascular;  Laterality: N/A;  . POLYPECTOMY    . SKIN CANCER EXCISION     right forearm/pre-cancerous/ 2 spots on left arm     Current Outpatient Medications  Medication Sig Dispense Refill   . acetaminophen (TYLENOL) 500 MG tablet Take 500-1,000 mg by mouth every 6 (six) hours as needed for mild pain or headache.    Marland Kitchen amLODipine (NORVASC) 5 MG tablet Take 1 tablet (5 mg total) by mouth daily. 30 tablet 3  . aspirin 81 MG chewable tablet Chew 1 tablet (81 mg total) by mouth daily.    Marland Kitchen atorvastatin (LIPITOR) 80 MG tablet TAKE 1 TABLET (80 MG TOTAL) BY MOUTH DAILY AT 6 PM. 90 tablet 2  . clopidogrel (PLAVIX) 75 MG tablet Take 1 tablet (75 mg total) by mouth daily with breakfast. 90 tablet 2  . lisinopril (ZESTRIL) 20 MG tablet Take 1 tablet (20 mg total) by mouth daily. 30 tablet 3  . nitroGLYCERIN (NITROSTAT) 0.4 MG SL tablet Place 1 tablet (0.4 mg total) under the tongue every 5 (five) minutes x 3 doses as needed for chest pain. 25 tablet 3  . pantoprazole (PROTONIX) 40 MG tablet TAKE 1 TABLET BY MOUTH EVERY DAY (Patient taking differently: Take 40 mg by mouth daily before breakfast. ) 90 tablet 2  . triamcinolone cream (KENALOG) 0.1 % Apply 1 application topically 2 (two) times daily. (Patient taking differently: Apply 1 application topically 2 (two) times daily as needed (itching or irritation). ) 30 g 1   No current facility-administered medications for this visit.     Allergies:   Penicillins and Prednisone    Social History:  The patient  reports that he has never smoked. His smokeless tobacco use includes chew. He reports that he does not drink alcohol or use drugs.   Family History:  The patient's family history includes Brain cancer in his sister; Cancer in his brother and father; Colon cancer in his brother and father; Kidney cancer in his brother, sister, sister, sister, and sister; Stroke in his mother.    ROS: All other systems are reviewed and negative. Unless otherwise mentioned in H&P    PHYSICAL EXAM: VS:  BP 116/70   Pulse (!) 54   Temp 97.7 F (36.5 C)   Ht 5\' 5"  (1.651 m)   Wt 157 lb (71.2 kg)   SpO2 97%   BMI 26.13 kg/m  , BMI Body mass index is  26.13 kg/m. GEN: Well nourished, well developed, in no acute distress HEENT: normal Neck: no JVD, carotid bruits, or masses Cardiac:RRR; no murmurs, rubs, or gallops,no edema  Respiratory:  Clear to auscultation bilaterally, normal work of breathing GI: soft, nontender, nondistended, + BS MS: no deformity or atrophy. No femoral bruits, strong bounding pulses in the left food. No discoloration. Pain with ROM of the left hip.  Skin: warm and dry, no rash.Some old ecchymosis on his abdomen from SQ injections while hospitalized.  Neuro:  Strength and sensation are intact. Some pain and numbness when moving left leg and hip.  Psych: euthymic mood, full affect   EKG:  Sinus bradycardia rate of 54 bpm.  Recent Labs: 10/13/2017: ALT 19 07/12/2018:  B Natriuretic Peptide 18.3 07/14/2018: BUN 6; Creatinine, Ser 0.77; Hemoglobin 13.0; Platelets 189; Potassium 3.9; Sodium 141    Lipid Panel    Component Value Date/Time   CHOL 108 07/13/2018 0513   CHOL 95 (L) 10/13/2017 1619   TRIG 234 (H) 07/13/2018 0513   HDL 22 (L) 07/13/2018 0513   HDL 22 (L) 10/13/2017 1619   CHOLHDL 4.9 07/13/2018 0513   VLDL 47 (H) 07/13/2018 0513   LDLCALC 39 07/13/2018 0513   LDLCALC 48 10/13/2017 1619   LDLDIRECT 129.0 05/24/2014 0946      Wt Readings from Last 3 Encounters:  07/25/18 157 lb (71.2 kg)  07/14/18 156 lb 14.4 oz (71.2 kg)  03/28/18 154 lb (69.9 kg)      Other studies Reviewed: Cath: 07/13/18   Prox RCA to Mid RCA lesion is 90% stenosed.  Mid RCA lesion is 60% stenosed.  Previously placed Ost Cx to Mid Cx stent (unknown type) is widely patent.  Prox LAD lesion is 50% stenosed.  Ost 1st Diag to 1st Diag lesion is 50% stenosed.  Post intervention, there is a 25% residual stenosis.  A stent was successfully placed.  Post intervention, there is a 25% residual stenosis.  The left ventricular systolic function is normal.  LV end diastolic pressure is normal.  The left ventricular  ejection fraction is 50-55% by visual estimate.  IMPRESSION:Successful balloon angioplasty of mid dominant RCA in-stent restenosis and restenting of the proximal RCA for high-grade in-stent restenosis with acceptable results. The previously placed circumflex stent was widely open and the LAD had at most moderate disease unchanged from his prior angiogram 3 years ago. His LV function was normal with a low LVEDP. He will need dual antiplatelet platelet therapy uninterrupted for at least 12 months.  [name]. MD, [facility]  TTE: 07/13/18  IMPRESSIONS   1. The left ventricle has normal systolic function with an ejection fraction of 60-65%. The cavity size was normal. Left ventricular diastolic Doppler parameters are consistent with impaired relaxation. No evidence of left ventricular regional wall  motion abnormalities. 2. The right ventricle has normal systolic function. The cavity was normal. There is no increase in right ventricular wall thickness.  ASSESSMENT AND PLAN:  1. CAD: S/P hospitalization for chest pain. Cardiac cath revealed in-stent stenosis of the existing stent to proximal and mid RCA. This was treated with PCI and DES placement with residual 25% stenosis. He will continue DAPT with ASA and Plavix along with statin therapy. No BB due to bradycardia.   2. Left hip pain: Having significant pain while bearing weight with numbness and weakness. Has to use his arms to hold him up in order to stand due to the discomfort,. He has some symptoms of radiculopathy, starting in the left gluteal area to the foot. He has good pulses, and no bruits in abdomen or femoral artery of the left leg. I doubt PAD causing his discomfort.   I have called his PCP office, Dr. Carolann Littler to make an appointment for him to be seen and evaluated. He is scheduled for 8:30 am, Friday, June 12 th to be seen in person. I will go ahead and proactively order a left hip X-ray and Lumbar spine X-ray for  Dr. Elease Hashimoto to review for his appointment. Defer treatment and recommendations at his discretion.   I have advised the patient to be careful of falls with the left hip pain and numbness especially on antiplatelet therapy. He verbalizes understanding. He is to  take Tylenol for pain.  3. Hypercholesterolemia:  He will continue atorvastatin with goal of LDL < 70. Follow up labs in 3 months.  4. Hypertension: Well controlled on current regimen. No changes at this time.    Current medicines are reviewed at length with the patient today.    Labs/ tests ordered today include: Left hip (2 view) and Lumbar spine.   Phill Myron. West Pugh, ANP, AACC   07/25/2018 11:23 AM    [facility] [place] [address] Office [phone] Fax [phone]

## 2018-07-25 NOTE — Patient Instructions (Addendum)
Medication Instructions:  MAY TAKE ARTHRITIS STRENGTH TYLENOL EVERY 6 HOURS AS NEEDED FOR PAIN. If you need a refill on your cardiac medications before your next appointment, please call your pharmacy.  Special Instructions: 2 VIEW LEFT HIP XRAY W/ L-S 2VW Your physician has requested that you have xrayS, is a fast and painless imaging test that uses certain electromagnetic waves to create pictures of the structures in and around your HIP AND LOWER BACK This test can help diagnose and monitor conditions such as pneumonia and other lung issues his will be done at [facility], [place]. If you should need to call them their phone number is [phone].  YOU APPOINTMENT IS SCHEDULED WITH DR Elease Hashimoto ON Friday 6-12 @830AM  STAY IN YOUR CAR AND CALL AT [phone], SHE WILL COME AND GET YOU FROM YOUR CAR  Follow-Up: .    Your physician wants you to follow-up KEEP SCHEDULED APPOINTMENT WITH DR [name] IN September.You will receive a reminder letter in the mail two months in advance. If you don't receive a letter, please call our office to schedule the follow-up appointment.   At [facility], you and your health needs are our priority.  As part of our continuing mission to provide you with exceptional heart care, we have created designated Provider Care Teams.  These Care Teams include your primary Cardiologist (physician) and Advanced Practice Providers (APPs -  Physician Assistants and Nurse Practitioners) who all work together to provide you with the care you need, when you need it.  Thank you for choosing [facility] at [facility]!!

## 2018-07-26 ENCOUNTER — Telehealth: Payer: Self-pay | Admitting: Adult Health

## 2018-07-26 ENCOUNTER — Other Ambulatory Visit (INDEPENDENT_AMBULATORY_CARE_PROVIDER_SITE_OTHER): Payer: Medicare Other

## 2018-07-26 DIAGNOSIS — I2510 Atherosclerotic heart disease of native coronary artery without angina pectoris: Secondary | ICD-10-CM

## 2018-07-26 DIAGNOSIS — E7800 Pure hypercholesterolemia, unspecified: Secondary | ICD-10-CM

## 2018-07-26 DIAGNOSIS — I10 Essential (primary) hypertension: Secondary | ICD-10-CM | POA: Diagnosis not present

## 2018-07-26 NOTE — Telephone Encounter (Signed)
Follow Up:    Wife is returning a call from this morning, there was not a name  left to call back.

## 2018-07-26 NOTE — Telephone Encounter (Signed)
S/w wife .. °

## 2018-07-28 ENCOUNTER — Ambulatory Visit (INDEPENDENT_AMBULATORY_CARE_PROVIDER_SITE_OTHER): Payer: Medicare Other | Admitting: Family Medicine

## 2018-07-28 ENCOUNTER — Other Ambulatory Visit: Payer: Self-pay

## 2018-07-28 ENCOUNTER — Encounter: Payer: Self-pay | Admitting: Family Medicine

## 2018-07-28 ENCOUNTER — Ambulatory Visit: Payer: Medicare Other | Admitting: Family Medicine

## 2018-07-28 VITALS — BP 140/68 | HR 68 | Temp 98.1°F | Wt 156.3 lb

## 2018-07-28 DIAGNOSIS — I2510 Atherosclerotic heart disease of native coronary artery without angina pectoris: Secondary | ICD-10-CM

## 2018-07-28 DIAGNOSIS — M5442 Lumbago with sciatica, left side: Secondary | ICD-10-CM

## 2018-07-28 MED ORDER — TRAMADOL HCL 50 MG PO TABS: 50.0000 mg | ORAL_TABLET | Freq: Four times a day (QID) | ORAL | 0 refills | Status: AC | PRN

## 2018-07-28 NOTE — Patient Instructions (Signed)
Acute Back Pain, Adult  Acute back pain is sudden and usually short-lived. It is often caused by an injury to the muscles and tissues in the back. The injury may result from:   A muscle or ligament getting overstretched or torn (strained). Ligaments are tissues that connect bones to each other. Lifting something improperly can cause a back strain.   Wear and tear (degeneration) of the spinal disks. Spinal disks are circular tissue that provides cushioning between the bones of the spine (vertebrae).   Twisting motions, such as while playing sports or doing yard work.   A hit to the back.   Arthritis.  You may have a physical exam, lab tests, and imaging tests to find the cause of your pain. Acute back pain usually goes away with rest and home care.  Follow these instructions at home:  Managing pain, stiffness, and swelling   Take over-the-counter and prescription medicines only as told by your health care provider.   Your health care provider may recommend applying ice during the first 24-48 hours after your pain starts. To do this:  ? Put ice in a plastic bag.  ? Place a towel between your skin and the bag.  ? Leave the ice on for 20 minutes, 2-3 times a day.   If directed, apply heat to the affected area as often as told by your health care provider. Use the heat source that your health care provider recommends, such as a moist heat pack or a heating pad.  ? Place a towel between your skin and the heat source.  ? Leave the heat on for 20-30 minutes.  ? Remove the heat if your skin turns bright red. This is especially important if you are unable to feel pain, heat, or cold. You have a greater risk of getting burned.  Activity     Do not stay in bed. Staying in bed for more than 1-2 days can delay your recovery.   Sit up and stand up straight. Avoid leaning forward when you sit, or hunching over when you stand.  ? If you work at a desk, sit close to it so you do not need to lean over. Keep your chin tucked  in. Keep your neck drawn back, and keep your elbows bent at a right angle. Your arms should look like the letter "L."  ? Sit high and close to the steering wheel when you drive. Add lower back (lumbar) support to your car seat, if needed.   Take short walks on even surfaces as soon as you are able. Try to increase the length of time you walk each day.   Do not sit, drive, or stand in one place for more than 30 minutes at a time. Sitting or standing for long periods of time can put stress on your back.   Do not drive or use heavy machinery while taking prescription pain medicine.   Use proper lifting techniques. When you bend and lift, use positions that put less stress on your back:  ? Bend your knees.  ? Keep the load close to your body.  ? Avoid twisting.   Exercise regularly as told by your health care provider. Exercising helps your back heal faster and helps prevent back injuries by keeping muscles strong and flexible.   Work with a physical therapist to make a safe exercise program, as recommended by your health care provider. Do any exercises as told by your physical therapist.  Lifestyle   Maintain   a healthy weight. Extra weight puts stress on your back and makes it difficult to have good posture.   Avoid activities or situations that make you feel anxious or stressed. Stress and anxiety increase muscle tension and can make back pain worse. Learn ways to manage anxiety and stress, such as through exercise.  General instructions   Sleep on a firm mattress in a comfortable position. Try lying on your side with your knees slightly bent. If you lie on your back, put a pillow under your knees.   Follow your treatment plan as told by your health care provider. This may include:  ? Cognitive or behavioral therapy.  ? Acupuncture or massage therapy.  ? Meditation or yoga.  Contact a health care provider if:   You have pain that is not relieved with rest or medicine.   You have increasing pain going down  into your legs or buttocks.   Your pain does not improve after 2 weeks.   You have pain at night.   You lose weight without trying.   You have a fever or chills.  Get help right away if:   You develop new bowel or bladder control problems.   You have unusual weakness or numbness in your arms or legs.   You develop nausea or vomiting.   You develop abdominal pain.   You feel faint.  Summary   Acute back pain is sudden and usually short-lived.   Use proper lifting techniques. When you bend and lift, use positions that put less stress on your back.   Take over-the-counter and prescription medicines and apply heat or ice as directed by your health care provider.  This information is not intended to replace advice given to you by your health care provider. Make sure you discuss any questions you have with your health care provider.  Document Released: 02/01/2005 Document Revised: 09/08/2017 Document Reviewed: 09/15/2016  Elsevier Interactive Patient Education  2019 [employer] Inc[employer]

## 2018-07-28 NOTE — Progress Notes (Signed)
Subjective:     Patient ID: Terry Rivers, male   DOB: 04-11-1950, 68 y.o.   MRN: [id number]  HPI Patient is seen with acute onset about 9 days ago of left lumbar back pain with radiation into the left buttock and occasionally down to the calf.  His recent history is that he had admission for chest pain.  He had cath on 07/13/2018 that showed proximal RCA to mid RCA lesion 90% stenosis.Marland Kitchen  He had successful balloon angioplasty of mid dominant RCA in-stent restenosis and restenting of the proximal RCA.  He is done well from cardiac standpoint since then.  His back pain started 9 days ago.  He woke up with some pain radiating into the left buttock occasional down to the calf.  7 out of 10 intensity at its worst.  Some relief with Tylenol.  No specific injury.  Does lots of gardening.  No prior history of significant back difficulties.  No numbness.  No weakness.  He had LS spine films ordered per cardiology which showed some mild degenerative changes L4-L5 and L5-S1.  Symptoms are actually better with walking.  No urine or stool incontinence  Past Medical History:  Diagnosis Date  . Arthritis   . Benign essential HTN   . Bradycardia    low 50"s per wife  . CAD, multiple vessel-native arteries, emergent PCI RCA with DES 04/15/15 2017   a. 03/2014- inferior STEMI with occluded RCA; b. 04/2015: Staged PCI of LCx c. 07/13/18 PCI to [facility] for ISR  . Chicken pox   . Diabetes mellitus without complication (HCC)    Borderline/no meds  . Frequent headaches   . GERD (gastroesophageal reflux disease)   . Hyperlipidemia LDL goal <70 04/17/2015  . Myocardial infarction ([place]) 03/2015  . Tobacco use 04/17/2015  . Unstable angina ([place]) 06/2018   Past Surgical History:  Procedure Laterality Date  . CARDIAC CATHETERIZATION N/A 04/15/2015   Procedure: Left Heart Cath and Coronary Angiography;  Surgeon: Peter M [name], MD;  Location: [place] CV LAB;  Service: Cardiovascular;  Laterality: N/A;  . CARDIAC  CATHETERIZATION N/A 04/15/2015   Procedure: Coronary Stent Intervention;  Surgeon: Peter M [name], MD;  Location: [place] CV LAB;  Service: Cardiovascular;  Laterality: N/A;  . CARDIAC CATHETERIZATION N/A 05/06/2015   Procedure: Coronary Stent Intervention;  Surgeon: Peter M [name], MD;  Location: [place] CV LAB;  Service: Cardiovascular;  Laterality: N/A;  . CARDIAC CATHETERIZATION  05/06/2015   Procedure: Coronary/Graft Angiography;  Surgeon: Peter M [name], MD;  Location: [place] CV LAB;  Service: Cardiovascular;;  . COLONOSCOPY    . CORONARY BALLOON ANGIOPLASTY N/A 07/13/2018   Procedure: CORONARY BALLOON ANGIOPLASTY;  Surgeon: [name], MD;  Location: [place] CV LAB;  Service: Cardiovascular;  Laterality: N/A;  . CORONARY STENT INTERVENTION N/A 07/13/2018   Procedure: CORONARY STENT INTERVENTION;  Surgeon: [name], MD;  Location: [place] CV LAB;  Service: Cardiovascular;  Laterality: N/A;  . CORONARY STENT PLACEMENT  05/06/2015   LT CIRC DES X2  . LEFT HEART CATH AND CORONARY ANGIOGRAPHY N/A 07/13/2018   Procedure: LEFT HEART CATH AND CORONARY ANGIOGRAPHY;  Surgeon: [name], MD;  Location: [place] CV LAB;  Service: Cardiovascular;  Laterality: N/A;  . POLYPECTOMY    . SKIN CANCER EXCISION     right forearm/pre-cancerous/ 2 spots on left arm    reports that he has never smoked. His smokeless tobacco use includes chew. He reports that he  does not drink alcohol or use drugs. family history includes Brain cancer in his sister; Cancer in his brother and father; Colon cancer in his brother and father; Kidney cancer in his brother, sister, sister, sister, and sister; Stroke in his mother. Allergies  Allergen Reactions  . Penicillins Anaphylaxis, Shortness Of Breath and Swelling    Joints became swollen /turned purple Did it involve swelling of the face/tongue/throat, SOB, or low BP? No Did it involve sudden or severe rash/hives, skin peeling, or  any reaction on the inside of your mouth or nose? No Did you need to seek medical attention at a hospital or doctor's office? Yes When did it last happen? "Many years ago" If all above answers are "NO", may proceed with cephalosporin use.    . Prednisone Swelling and Other (See Comments)    Made the patient's joints swell to the point where he could not walk     Review of Systems  Constitutional: Negative for activity change, appetite change and fever.  Respiratory: Negative for cough and shortness of breath.   Cardiovascular: Negative for chest pain and leg swelling.  Gastrointestinal: Negative for abdominal pain and vomiting.  Genitourinary: Negative for dysuria, flank pain and hematuria.  Musculoskeletal: Positive for back pain. Negative for joint swelling.  Neurological: Negative for weakness and numbness.       Objective:   Physical Exam Constitutional:      General: He is not in acute distress.    Appearance: He is well-developed.  Neck:     Thyroid: No thyromegaly.  Cardiovascular:     Rate and Rhythm: Normal rate and regular rhythm.     Heart sounds: Normal heart sounds. No murmur.     Comments: Feet are warm to touch with good distal pulses Pulmonary:     Effort: Pulmonary effort is normal. No respiratory distress.     Breath sounds: Normal breath sounds. No wheezing or rales.  Skin:    Findings: No rash.  Neurological:     Mental Status: He is alert and oriented to person, place, and time.     Cranial Nerves: No cranial nerve deficit.     Deep Tendon Reflexes: Reflexes are normal and symmetric.     Comments: Deep tendon reflexes are 1+ knee and ankle bilaterally.  Normal sensory function throughout.  No focal strength deficits        Assessment:     Acute low back pain left-sided with some radiculitis symptoms but nonfocal neuro exam.  Symptoms are relatively mild at this time.    Plan:     -We decided not to use any prednisone since he has reported  history of intolerance. -We reviewed some extension stretches to work on at home -Continue Tylenol and may supplement with Ultram 50 mg every 6 hours as needed -Touch base if symptoms not improving in 2 weeks.  Would consider physical therapy trial if no better in 2 weeks.  Follow-up sooner for any weakness, progressive pain, or other concerns  [name] MD [facility] at [facility]

## 2018-09-08 ENCOUNTER — Telehealth: Payer: Self-pay | Admitting: Cardiology

## 2018-09-08 MED ORDER — PANTOPRAZOLE SODIUM 40 MG PO TBEC: 40.0000 mg | DELAYED_RELEASE_TABLET | Freq: Every day | ORAL | 3 refills | Status: DC

## 2018-09-08 NOTE — Telephone Encounter (Signed)
Rx(s) sent to pharmacy electronically.  

## 2018-09-08 NOTE — Telephone Encounter (Signed)
°*  STAT* If patient is at the pharmacy, call can be transferred to refill team.   1. Which medications need to be refilled? (please list name of each medication and dose if known) pantoprazole (PROTONIX) 40 MG tablet  2. Which pharmacy/location (including street and city if local pharmacy) is medication to be sent to? [id number] - [place], [place] - [address]  3. Do they need a 30 day or 90 day supply? 90 day  

## 2018-09-14 ENCOUNTER — Telehealth: Payer: Self-pay | Admitting: Cardiology

## 2018-09-14 NOTE — Telephone Encounter (Signed)
Spoke to patient's wife she stated she will be faxing a secondary insurance form for Dr.Berry to sign.Advised Dr.Berry is out of office.Stated she is no rush.Stated call her when form is signed and she will pick up.Advised I will let Dr.Berry's RN know.

## 2018-09-14 NOTE — Telephone Encounter (Signed)
°  Wife called this morning and states that she needs Dr. Gwenlyn Found and Dr. [name] to sign a form about the patient's hospital visit for it to be covered by her secondary insurance. She also states that Dr. Doug Sou nurse has helped her fill out forms like this in the past. She just would need Dr. Kennon Holter signature because he was the one to do the procedure while the patient was in the hospital.   I provided the wife with the office fax number. She will send the form to the office later today or tomorrow

## 2018-09-26 ENCOUNTER — Telehealth: Payer: Self-pay

## 2018-09-26 ENCOUNTER — Other Ambulatory Visit: Payer: Self-pay

## 2018-09-26 MED ORDER — NITROGLYCERIN 0.4 MG SL SUBL: 0.4000 mg | SUBLINGUAL_TABLET | SUBLINGUAL | 3 refills | Status: DC | PRN

## 2018-09-26 NOTE — Telephone Encounter (Signed)
Gave [employer] of [place], accident, and disability claim form to medical records (cynthia roberts) on 09/26/2018

## 2018-09-28 NOTE — Telephone Encounter (Signed)
Signed form given to Elly Modena, LPN in triage on 2/54

## 2018-09-29 ENCOUNTER — Telehealth: Payer: Self-pay | Admitting: Cardiology

## 2018-09-29 NOTE — Telephone Encounter (Signed)
Spoke to patient's wife NTA disability form for 07/12/18 to 07/14/18 services completed and mailed to patient this afternoon.

## 2018-09-29 NOTE — Telephone Encounter (Signed)
New message:    Patient wife calling concerning some forms that was faxed. Please call patient wife.

## 2018-10-29 NOTE — Progress Notes (Signed)
Cardiology Office Note:    Date:  10/31/2018   ID:  Terry Rivers, DOB May 14, 1950, MRN [id number]  PCP:  [name], MD  Cardiologist:  Dr. Orian Amberg [name]    Chief Complaint  Patient presents with   Coronary Artery Disease    History of Present Illness:     Terry Rivers is a 67 y.o. male with a hx of CAD, bradycardia, DM (diet controlled), DJD.    Admitted 2/28-04/17/15 with inferior STEMI.  LHC demonstrated multivessel CAD with occluded RCA as the culprit. RCA was treated with DES.  He has residual LAD/Dx and LCx disease. He was not started on beta-blocker due to bradycardia. On 05/06/15 he underwent staged PCI of the proximal to mid LCx with a DES. The mid LAD had a 60% stenosis with FFR of 0.9 so will be treated medically.  Has done very well until March when he began experiencing intermittent exertional chest pain and dyspnea.  He was admitted in May and had repeat cardiac cath showing restenosis in the RCA treated with DES. Stents in the LAD and LCx still patent.  For BP control his lisinopril dose was increased and amlodipine added.   On follow up today he is doing very well. He stays very busy at home with heavy yard work and gardening.   Denies chest pain with exertion or DOE. Denies syncope, dizziness.  Denies orthopnea, PND, edema.  Energy level is good.    Past Medical History:  Diagnosis Date   Arthritis    Benign essential HTN    Bradycardia    low 50"s per wife   CAD, multiple vessel-native arteries, emergent PCI RCA with DES 04/15/15 2017   a. 03/2014- inferior STEMI with occluded RCA; b. 04/2015: Staged PCI of LCx c. 07/13/18 PCI to [name] for ISR   Chicken pox    Diabetes mellitus without complication ([place])    Borderline/no meds   Frequent headaches    GERD (gastroesophageal reflux disease)    Hyperlipidemia LDL goal <70 04/17/2015   Myocardial infarction ([place]) 03/2015   Tobacco use 04/17/2015   Unstable angina ([place]) 06/2018    Past Surgical  History:  Procedure Laterality Date   CARDIAC CATHETERIZATION N/A 04/15/2015   Procedure: Left Heart Cath and Coronary Angiography;  Surgeon: Drayke Grabel M [name], MD;  Location: [place] [facility];  Service: Cardiovascular;  Laterality: N/A;   CARDIAC CATHETERIZATION N/A 04/15/2015   Procedure: Coronary Stent Intervention;  Surgeon: Aolani Piggott M [name], MD;  Location: [place] [facility];  Service: Cardiovascular;  Laterality: N/A;   CARDIAC CATHETERIZATION N/A 05/06/2015   Procedure: Coronary Stent Intervention;  Surgeon: Belkis Norbeck M [name], MD;  Location: [place] [facility];  Service: Cardiovascular;  Laterality: N/A;   CARDIAC CATHETERIZATION  05/06/2015   Procedure: Coronary/Graft Angiography;  Surgeon: Janashia Parco M [name], MD;  Location: [place] [facility];  Service: Cardiovascular;;   COLONOSCOPY     CORONARY BALLOON ANGIOPLASTY N/A 07/13/2018   Procedure: CORONARY BALLOON ANGIOPLASTY;  Surgeon: [name], MD;  Location: [place] [facility];  Service: Cardiovascular;  Laterality: N/A;   CORONARY STENT INTERVENTION N/A 07/13/2018   Procedure: CORONARY STENT INTERVENTION;  Surgeon: [name], MD;  Location: [place] [facility];  Service: Cardiovascular;  Laterality: N/A;   CORONARY STENT PLACEMENT  05/06/2015   LT CIRC DES X2   LEFT HEART CATH AND CORONARY ANGIOGRAPHY N/A 07/13/2018   Procedure: LEFT HEART CATH AND CORONARY ANGIOGRAPHY;  Surgeon: [name],  MD;  Location: [place] [facility];  Service: Cardiovascular;  Laterality: N/A;   POLYPECTOMY     SKIN CANCER EXCISION     right forearm/pre-cancerous/ 2 spots on left arm    Current Medications: Outpatient Medications Prior to Visit  Medication Sig Dispense Refill   acetaminophen (TYLENOL) 500 MG tablet Take 500-1,000 mg by mouth every 6 (six) hours as needed for mild pain or headache.     amLODipine (NORVASC) 5 MG tablet Take 1 tablet (5 mg total) by mouth daily. 90 tablet 3   aspirin 81 MG chewable tablet Chew 1 tablet  (81 mg total) by mouth daily.     atorvastatin (LIPITOR) 80 MG tablet Take 1 tablet (80 mg total) by mouth daily at 6 PM. 90 tablet 3   clopidogrel (PLAVIX) 75 MG tablet Take 1 tablet (75 mg total) by mouth daily with breakfast. 90 tablet 3   lisinopril (ZESTRIL) 20 MG tablet Take 1 tablet (20 mg total) by mouth daily. 90 tablet 3   nitroGLYCERIN (NITROSTAT) 0.4 MG SL tablet Place 1 tablet (0.4 mg total) under the tongue every 5 (five) minutes x 3 doses as needed for chest pain. 25 tablet 3   pantoprazole (PROTONIX) 40 MG tablet Take 1 tablet (40 mg total) by mouth daily before breakfast. 90 tablet 3   triamcinolone cream (KENALOG) 0.1 % Apply 1 application topically 2 (two) times daily. (Patient taking differently: Apply 1 application topically 2 (two) times daily as needed (itching or irritation). ) 30 g 1   No facility-administered medications prior to visit.      Allergies:   Penicillins and Prednisone   Social History   Socioeconomic History   Marital status: Married    Spouse name: Not on file   Number of children: Not on file   Years of education: Not on file   Highest education level: Not on file  Occupational History   Not on file  Social Needs   Financial resource strain: Not on file   Food insecurity    Worry: Not on file    Inability: Not on file   Transportation needs    Medical: Not on file    Non-medical: Not on file  Tobacco Use   Smoking status: Never Smoker   Smokeless tobacco: Current User    Types: Chew   Tobacco comment: chew every day  Substance and Sexual Activity   Alcohol use: No    Alcohol/week: 0.0 standard drinks   Drug use: No   Sexual activity: Not on file  Lifestyle   Physical activity    Days per week: Not on file    Minutes per session: Not on file   Stress: Not on file  Relationships   Social connections    Talks on phone: Not on file    Gets together: Not on file    Attends religious service: Not on file     Active member of club or organization: Not on file    Attends meetings of clubs or organizations: Not on file    Relationship status: Not on file  Other Topics Concern   Not on file  Social History Narrative   Not on file     Family History:  The patient's family history includes Brain cancer in his sister; Cancer in his brother and father; Colon cancer in his brother and father; Kidney cancer in his brother, sister, sister, sister, and sister; Stroke in his mother.   ROS:   Please  see the history of present illness.    ROSAll other systems reviewed and are negative.   Physical Exam:    VS:  BP 130/76 (BP Location: Right Arm)    Pulse 64    Ht '5\' 5"'  (1.651 m)    Wt 157 lb 3.2 oz (71.3 kg)    SpO2 97%    BMI 26.16 kg/m    GENERAL:  Well appearing WM in NAD HEENT:  PERRL, EOMI, sclera are clear. Oropharynx is clear. NECK:  No jugular venous distention, carotid upstroke brisk and symmetric, no bruits, no thyromegaly or adenopathy LUNGS:  Clear to auscultation bilaterally CHEST:  Unremarkable HEART:  RRR,  PMI not displaced or sustained,S1 and S2 within normal limits, no S3, no S4: no clicks, no rubs, no murmurs ABD:  Soft, nontender. BS +, no masses or bruits. No hepatomegaly, no splenomegaly EXT:  2 + pulses throughout, no edema, no cyanosis no clubbing SKIN:  Warm and dry.  No rashes NEURO:  Alert and oriented x 3. Cranial nerves II through XII intact. PSYCH:  Cognitively intact      Wt Readings from Last 3 Encounters:  10/31/18 157 lb 3.2 oz (71.3 kg)  07/28/18 156 lb 4.8 oz (70.9 kg)  07/25/18 157 lb (71.2 kg)      Studies/Labs Reviewed:     EKG:  EKG is  Not ordered today.   Recent Labs: 07/12/2018: B Natriuretic Peptide 18.3 07/14/2018: BUN 6; Creatinine, Ser 0.77; Hemoglobin 13.0; Platelets 189; Potassium 3.9; Sodium 141   Recent Lipid Panel    Component Value Date/Time   CHOL 108 07/13/2018 0513   CHOL 95 (L) 10/13/2017 1619   TRIG 234 (H) 07/13/2018 0513     HDL 22 (L) 07/13/2018 0513   HDL 22 (L) 10/13/2017 1619   CHOLHDL 4.9 07/13/2018 0513   VLDL 47 (H) 07/13/2018 0513   LDLCALC 39 07/13/2018 0513   LDLCALC 48 10/13/2017 1619   LDLDIRECT 129.0 05/24/2014 0946    Additional studies/ records that were reviewed today include:   Echo 07/13/18: IMPRESSIONS    1. The left ventricle has normal systolic function with an ejection fraction of 60-65%. The cavity size was normal. Left ventricular diastolic Doppler parameters are consistent with impaired relaxation. No evidence of left ventricular regional wall  motion abnormalities.  2. The right ventricle has normal systolic function. The cavity was normal. There is no increase in right ventricular wall thickness.   Cardiac cath/PCI 07/13/18: CORONARY BALLOON ANGIOPLASTY  CORONARY STENT INTERVENTION  LEFT HEART CATH AND CORONARY ANGIOGRAPHY  Conclusion    Prox RCA to Mid RCA lesion is 90% stenosed.  Mid RCA lesion is 60% stenosed.  Previously placed Ost Cx to Mid Cx stent (unknown type) is widely patent.  Prox LAD lesion is 50% stenosed.  Ost 1st Diag to 1st Diag lesion is 50% stenosed.  Post intervention, there is a 25% residual stenosis.  A stent was successfully placed.  Post intervention, there is a 25% residual stenosis.  The left ventricular systolic function is normal.  LV end diastolic pressure is normal.  The left ventricular ejection fraction is 50-55% by visual estimate.   Terry Rivers is a 68 y.o. male    [id number] LOCATION:  FACILITY: [facility]  PHYSICIAN: [name], M.D. [birth year]   DATE OF PROCEDURE:  07/13/2018  DATE OF DISCHARGE:     CARDIAC CATHETERIZATION / PCI DES RCA    History obtained from chart review.Edmund P Hopperis a 68 y.o.malewith a  hx of CADs/p PCIto RCA;staged PCI to P LCx3/2017, bradycardia, DM2(diet controlled)andDJDwho presented [facility] with chest pain and SOB.   PROCEDURE DESCRIPTION:    The patient was brought to the second floor [place] [facility]iac cath lab in the postabsorptive state. He was not premedicated . His right wrist was prepped and shaved in usual sterile fashion. Xylocaine 1% was used  for local anesthesia. A 6 French sheath was inserted into the right radial artery using standard Seldinger technique. The patient received 3500 units  of heparin intravenously.  A 5 [place] TIG catheter and pigtail catheters were used for selective coronary angiography and left ventriculography respectively.  Isovue dye was used for the entirety of the case.  Retrograde aorta, ventricular and pullback pressures were recorded.  Radial cocktail was administered via the SideArm sheath.  The patient received an additional 6500 units of heparin followed by 2000 units of heparin with an ACT in the high 200 range.  The patient received 600 mg of p.o. Plavix in addition to 20 mg of IV Pepcid.  Using a 6 [place] AL 0.75 guide catheter along with a whisper wire I was able to cross the proximal band and lesion with some difficulty ultimately placing the wire in the distal PLA branch.  I then predilated the proximal lesion with a 2 oh balloon but was unable to pass a stent because of tortuosity.  I then placed a guide liner just proximal to the lesion and was able to pass a three 5 x 12 mm Synergy drug-eluting stent across the proximal in-stent restenosis deployed at 18 atm.  I then used a 04/20/2010 balloon to dilate the mid 50 to 60% lesion as well as post dilate the stent.  There was still mild "dog boning" within the Synergy stent in the proximal portion with residual 25% stenosis.  The guidewire and guide liner were removed and completion angiography was performed.  The guide catheter was removed from the body, the sheath was removed and a TR band was placed on the right wrist to achieve patent hemostasis.  The patient left lab in stable condition.   IMPRESSION: Successful balloon angioplasty of mid  dominant RCA in-stent restenosis and restenting of the proximal RCA for high-grade in-stent restenosis with acceptable results.  The previously placed circumflex stent was widely open and the LAD had at most moderate disease unchanged from his prior angiogram 3 years ago.  His LV function was normal with a low LVEDP.  He will need dual antiplatelet platelet therapy uninterrupted for at least 12 months.  [name]. MD, [facility] 07/13/2018 10:57 AM          ASSESSMENT:     1. Coronary artery disease involving native coronary artery of native heart without angina pectoris   2. Essential hypertension   3. Hypercholesterolemia     PLAN:     In order of problems listed above:  1. CAD - s/p inf STEMI in March 2017 tx with DES to RCA.  Staged PCI of the proximal to mid LCx with DES.  Recurrent symptoms in May. S/p repeat PCI of the RCA with DES.  Continue ASA and Plavix for at least one year and maybe indefinitely. Continue  statin.   No beta blocker due to history of bradycardia.    2. HTN - BP is well controlled on lisinopril.   3. HL - Continue statin. Last lipids were excellent.     Medication Adjustments/Labs and Tests Ordered: Current medicines are  reviewed at length with the patient today.  Concerns regarding medicines are outlined above.  Medication changes, Labs and Tests ordered today are outlined in the Patient Instructions noted below. There are no Patient Instructions on file for this visit.    Signed, Lucita Montoya [name], MD  10/31/2018 4:42 PM    [place]

## 2018-10-31 ENCOUNTER — Encounter: Payer: Self-pay | Admitting: Cardiology

## 2018-10-31 ENCOUNTER — Ambulatory Visit (INDEPENDENT_AMBULATORY_CARE_PROVIDER_SITE_OTHER): Payer: Medicare Other | Admitting: Cardiology

## 2018-10-31 ENCOUNTER — Other Ambulatory Visit: Payer: Self-pay

## 2018-10-31 VITALS — BP 130/76 | HR 64 | Ht 65.0 in | Wt 157.2 lb

## 2018-10-31 DIAGNOSIS — I10 Essential (primary) hypertension: Secondary | ICD-10-CM | POA: Diagnosis not present

## 2018-10-31 DIAGNOSIS — E7800 Pure hypercholesterolemia, unspecified: Secondary | ICD-10-CM

## 2018-10-31 DIAGNOSIS — I2510 Atherosclerotic heart disease of native coronary artery without angina pectoris: Secondary | ICD-10-CM | POA: Diagnosis not present

## 2018-11-11 ENCOUNTER — Other Ambulatory Visit: Payer: Self-pay

## 2018-11-11 ENCOUNTER — Ambulatory Visit (INDEPENDENT_AMBULATORY_CARE_PROVIDER_SITE_OTHER): Payer: Medicare Other

## 2018-11-11 DIAGNOSIS — Z23 Encounter for immunization: Secondary | ICD-10-CM

## 2019-01-30 ENCOUNTER — Other Ambulatory Visit: Payer: Self-pay | Admitting: Cardiology

## 2019-01-30 NOTE — Telephone Encounter (Signed)
Rx has been sent to the pharmacy electronically. ° °

## 2019-03-23 DIAGNOSIS — Z23 Encounter for immunization: Secondary | ICD-10-CM | POA: Diagnosis not present

## 2019-04-21 DIAGNOSIS — Z23 Encounter for immunization: Secondary | ICD-10-CM | POA: Diagnosis not present

## 2019-05-07 NOTE — Progress Notes (Signed)
Cardiology Office Note:    Date:  05/10/2019   ID:  Terry Rivers, DOB [birth year], MRN [id number]  PCP:  [name], MD  Cardiologist:  Dr. Kinaya Hilliker [name]    Chief Complaint  Patient presents with  . Shortness of Breath  . Coronary Artery Disease    History of Present Illness:     Terry Rivers is a 69 y.o. male with a hx of CAD, bradycardia, DM (diet controlled), DJD.    Admitted 2/28-04/17/15 with inferior STEMI.  LHC demonstrated multivessel CAD with occluded RCA as the culprit. RCA was treated with DES.  He has residual LAD/Dx and LCx disease. He was not started on beta-blocker due to bradycardia. On 05/06/15 he underwent staged PCI of the proximal to mid LCx with a DES. The mid LAD had a 60% stenosis with FFR of 0.9 and was  treated medically.  Has done very well until March 2020 when he began experiencing intermittent exertional chest pain and dyspnea.  He was admitted in May and had repeat cardiac cath showing restenosis in the RCA treated with DES. Stents in the  LCx still patent.  LAD disease unchanged. For BP control his lisinopril dose was increased and amlodipine added.   On follow up today he is doing very well. He has been quite sedentary this winter and has gained 13 lbs. He notes when he takes his garbage can down 400-450 ft he gets out of breath. No chest pain. Ready to get out and start gardening and working in his yard Denies syncope, dizziness.  Denies orthopnea, PND, edema.  Energy level is good.    Past Medical History:  Diagnosis Date  . Arthritis   . Benign essential HTN   . Bradycardia    low 50"s per wife  . CAD, multiple vessel-native arteries, emergent PCI RCA with DES 04/15/15 2017   a. 03/2014- inferior STEMI with occluded RCA; b. 04/2015: Staged PCI of LCx c. 07/13/18 PCI to [facility] for ISR  . Chicken pox   . Diabetes mellitus without complication (HCC)    Borderline/no meds  . Frequent headaches   . GERD (gastroesophageal reflux disease)   .  Hyperlipidemia LDL goal <70 04/17/2015  . Myocardial infarction ([place]) 03/2015  . Tobacco use 04/17/2015  . Unstable angina ([place]) 06/2018    Past Surgical History:  Procedure Laterality Date  . CARDIAC CATHETERIZATION N/A 04/15/2015   Procedure: Left Heart Cath and Coronary Angiography;  Surgeon: Lamere Lightner M [name], MD;  Location: [place] [facility];  Service: Cardiovascular;  Laterality: N/A;  . CARDIAC CATHETERIZATION N/A 04/15/2015   Procedure: Coronary Stent Intervention;  Surgeon: Andriea Hasegawa M [name], MD;  Location: [place] [facility];  Service: Cardiovascular;  Laterality: N/A;  . CARDIAC CATHETERIZATION N/A 05/06/2015   Procedure: Coronary Stent Intervention;  Surgeon: Demarion Pondexter M [name], MD;  Location: [place] [facility];  Service: Cardiovascular;  Laterality: N/A;  . CARDIAC CATHETERIZATION  05/06/2015   Procedure: Coronary/Graft Angiography;  Surgeon: Krissy Orebaugh M [name], MD;  Location: [place] [facility];  Service: Cardiovascular;;  . COLONOSCOPY    . CORONARY BALLOON ANGIOPLASTY N/A 07/13/2018   Procedure: CORONARY BALLOON ANGIOPLASTY;  Surgeon: [name], MD;  Location: [place] [facility];  Service: Cardiovascular;  Laterality: N/A;  . CORONARY STENT INTERVENTION N/A 07/13/2018   Procedure: CORONARY STENT INTERVENTION;  Surgeon: [name], MD;  Location: [place] [facility];  Service: Cardiovascular;  Laterality: N/A;  . CORONARY STENT PLACEMENT  05/06/2015  LT CIRC DES X2  . LEFT HEART CATH AND CORONARY ANGIOGRAPHY N/A 07/13/2018   Procedure: LEFT HEART CATH AND CORONARY ANGIOGRAPHY;  Surgeon: [name], MD;  Location: [place] [facility];  Service: Cardiovascular;  Laterality: N/A;  . POLYPECTOMY    . SKIN CANCER EXCISION     right forearm/pre-cancerous/ 2 spots on left arm    Current Medications: Outpatient Medications Prior to Visit  Medication Sig Dispense Refill  . acetaminophen (TYLENOL) 500 MG tablet Take 500-1,000 mg by mouth every 6 (six) hours as needed for mild pain  or headache.    Marland Kitchen amLODipine (NORVASC) 5 MG tablet TAKE 1 TABLET BY MOUTH DAILY 90 tablet 3  . aspirin 81 MG chewable tablet Chew 1 tablet (81 mg total) by mouth daily.    Marland Kitchen atorvastatin (LIPITOR) 80 MG tablet Take 1 tablet (80 mg total) by mouth daily at 6 PM. 90 tablet 3  . clopidogrel (PLAVIX) 75 MG tablet Take 1 tablet (75 mg total) by mouth daily with breakfast. 90 tablet 3  . lisinopril (ZESTRIL) 20 MG tablet TAKE 1 TABLET BY MOUTH DAILY 90 tablet 3  . nitroGLYCERIN (NITROSTAT) 0.4 MG SL tablet Place 1 tablet (0.4 mg total) under the tongue every 5 (five) minutes x 3 doses as needed for chest pain. 25 tablet 3  . pantoprazole (PROTONIX) 40 MG tablet Take 1 tablet (40 mg total) by mouth daily before breakfast. 90 tablet 3  . triamcinolone cream (KENALOG) 0.1 % Apply 1 application topically 2 (two) times daily. (Patient taking differently: Apply 1 application topically 2 (two) times daily as needed (itching or irritation). ) 30 g 1   No facility-administered medications prior to visit.     Allergies:   Penicillins and Prednisone   Social History   Socioeconomic History  . Marital status: Married    Spouse name: Not on file  . Number of children: Not on file  . Years of education: Not on file  . Highest education level: Not on file  Occupational History  . Not on file  Tobacco Use  . Smoking status: Never Smoker  . Smokeless tobacco: Current User    Types: Chew  . Tobacco comment: chew every day  Substance and Sexual Activity  . Alcohol use: No    Alcohol/week: 0.0 standard drinks  . Drug use: No  . Sexual activity: Not on file  Other Topics Concern  . Not on file  Social History Narrative  . Not on file   Social Determinants of Health   Financial Resource Strain:   . Difficulty of Paying Living Expenses:   Food Insecurity:   . Worried About Charity fundraiser in the Last Year:   . Arboriculturist in the Last Year:   Transportation Needs:   . Film/video editor  (Medical):   Marland Kitchen Lack of Transportation (Non-Medical):   Physical Activity:   . Days of Exercise per Week:   . Minutes of Exercise per Session:   Stress:   . Feeling of Stress :   Social Connections:   . Frequency of Communication with Friends and Family:   . Frequency of Social Gatherings with Friends and Family:   . Attends Religious Services:   . Active Member of Clubs or Organizations:   . Attends Archivist Meetings:   Marland Kitchen Marital Status:      Family History:  The patient's family history includes Brain cancer in his sister; Cancer in his brother  and father; Colon cancer in his brother and father; Kidney cancer in his brother, sister, sister, sister, and sister; Stroke in his mother.   ROS:   Please see the history of present illness.    ROSAll other systems reviewed and are negative.   Physical Exam:    VS:  BP 120/71   Pulse 62   Temp (!) 97 F (36.1 C)   Ht '5\' 5"'  (1.651 m)   Wt 169 lb 6.4 oz (76.8 kg)   SpO2 95%   BMI 28.19 kg/m    GENERAL:  Well appearing WM in NAD HEENT:  PERRL, EOMI, sclera are clear. Oropharynx is clear. NECK:  No jugular venous distention, carotid upstroke brisk and symmetric, no bruits, no thyromegaly or adenopathy LUNGS:  Clear to auscultation bilaterally CHEST:  Unremarkable HEART:  RRR,  PMI not displaced or sustained,S1 and S2 within normal limits, no S3, no S4: no clicks, no rubs, no murmurs ABD:  Soft, nontender. BS +, no masses or bruits. No hepatomegaly, no splenomegaly EXT:  2 + pulses throughout, no edema, no cyanosis no clubbing SKIN:  Warm and dry.  No rashes NEURO:  Alert and oriented x 3. Cranial nerves II through XII intact. PSYCH:  Cognitively intact      Wt Readings from Last 3 Encounters:  05/10/19 169 lb 6.4 oz (76.8 kg)  10/31/18 157 lb 3.2 oz (71.3 kg)  07/28/18 156 lb 4.8 oz (70.9 kg)      Studies/Labs Reviewed:     EKG:  EKG is  Not ordered today.   Recent Labs: 07/12/2018: B Natriuretic Peptide  18.3 07/14/2018: BUN 6; Creatinine, Ser 0.77; Hemoglobin 13.0; Platelets 189; Potassium 3.9; Sodium 141   Recent Lipid Panel    Component Value Date/Time   CHOL 108 07/13/2018 0513   CHOL 95 (L) 10/13/2017 1619   TRIG 234 (H) 07/13/2018 0513   HDL 22 (L) 07/13/2018 0513   HDL 22 (L) 10/13/2017 1619   CHOLHDL 4.9 07/13/2018 0513   VLDL 47 (H) 07/13/2018 0513   LDLCALC 39 07/13/2018 0513   LDLCALC 48 10/13/2017 1619   LDLDIRECT 129.0 05/24/2014 0946    Additional studies/ records that were reviewed today include:   Echo 07/13/18: IMPRESSIONS    1. The left ventricle has normal systolic function with an ejection fraction of 60-65%. The cavity size was normal. Left ventricular diastolic Doppler parameters are consistent with impaired relaxation. No evidence of left ventricular regional wall  motion abnormalities.  2. The right ventricle has normal systolic function. The cavity was normal. There is no increase in right ventricular wall thickness.   Cardiac cath/PCI 07/13/18: CORONARY BALLOON ANGIOPLASTY  CORONARY STENT INTERVENTION  LEFT HEART CATH AND CORONARY ANGIOGRAPHY  Conclusion    Prox RCA to Mid RCA lesion is 90% stenosed.  Mid RCA lesion is 60% stenosed.  Previously placed Ost Cx to Mid Cx stent (unknown type) is widely patent.  Prox LAD lesion is 50% stenosed.  Ost 1st Diag to 1st Diag lesion is 50% stenosed.  Post intervention, there is a 25% residual stenosis.  A stent was successfully placed.  Post intervention, there is a 25% residual stenosis.  The left ventricular systolic function is normal.  LV end diastolic pressure is normal.  The left ventricular ejection fraction is 50-55% by visual estimate.   Terry Rivers is a 69 y.o. male    [id number] LOCATION:  FACILITY: [facility]  PHYSICIAN: [name], M.D. [birth year]   DATE OF PROCEDURE:  07/13/2018  DATE OF DISCHARGE:     CARDIAC CATHETERIZATION / PCI DES RCA    History  obtained from chart review.Terry P Hopperis a 69 y.o.malewith a hx of CADs/p PCIto RCA;staged PCI to P LCx3/2017, bradycardia, DM2(diet controlled)andDJDwho presented [facility] with chest pain and SOB.   PROCEDURE DESCRIPTION:   The patient was brought to the second floor [place] Cardiac cath lab in the postabsorptive state. He was not premedicated . His right wrist was prepped and shaved in usual sterile fashion. Xylocaine 1% was used  for local anesthesia. A 6 French sheath was inserted into the right radial artery using standard Seldinger technique. The patient received 3500 units  of heparin intravenously.  A 5 [place] TIG catheter and pigtail catheters were used for selective coronary angiography and left ventriculography respectively.  Isovue dye was used for the entirety of the case.  Retrograde aorta, ventricular and pullback pressures were recorded.  Radial cocktail was administered via the SideArm sheath.  The patient received an additional 6500 units of heparin followed by 2000 units of heparin with an ACT in the high 200 range.  The patient received 600 mg of p.o. Plavix in addition to 20 mg of IV Pepcid.  Using a 6 [place] AL 0.75 guide catheter along with a whisper wire I was able to cross the proximal band and lesion with some difficulty ultimately placing the wire in the distal PLA branch.  I then predilated the proximal lesion with a 2 oh balloon but was unable to pass a stent because of tortuosity.  I then placed a guide liner just proximal to the lesion and was able to pass a three 5 x 12 mm Synergy drug-eluting stent across the proximal in-stent restenosis deployed at 18 atm.  I then used a 04/20/2010 balloon to dilate the mid 50 to 60% lesion as well as post dilate the stent.  There was still mild "dog boning" within the Synergy stent in the proximal portion with residual 25% stenosis.  The guidewire and guide liner were removed and completion angiography was  performed.  The guide catheter was removed from the body, the sheath was removed and a TR band was placed on the right wrist to achieve patent hemostasis.  The patient left lab in stable condition.   IMPRESSION: Successful balloon angioplasty of mid dominant RCA in-stent restenosis and restenting of the proximal RCA for high-grade in-stent restenosis with acceptable results.  The previously placed circumflex stent was widely open and the LAD had at most moderate disease unchanged from his prior angiogram 3 years ago.  His LV function was normal with a low LVEDP.  He will need dual antiplatelet platelet therapy uninterrupted for at least 12 months.  [name]. MD, [facility] 07/13/2018 10:57 AM          ASSESSMENT:     1. CAD, multiple vessel-native arteries, emergent PCI RCA with DES 04/15/15   2. Hyperlipidemia LDL goal <70   3. Essential hypertension     PLAN:     In order of problems listed above:  1. CAD - s/p inf STEMI in March 2017 tx with DES to RCA.  Staged PCI of the proximal to mid LCx with DES.  Recurrent symptoms in May 2020. S/p repeat PCI of the RCA with DES.  Continue ASA and Plavix indefinitely given multiple stents.  Continue  statin.   No beta blocker due to history of bradycardia. I suspect his dyspnea is related to inactivity  and weight gain. No angina.   2. HTN - BP is well controlled on lisinopril.   3. HL - Continue statin. Will update labs today     Medication Adjustments/Labs and Tests Ordered: Current medicines are reviewed at length with the patient today.  Concerns regarding medicines are outlined above.  Medication changes, Labs and Tests ordered today are outlined in the Patient Instructions noted below. There are no Patient Instructions on file for this visit.    Signed, Laramie Meissner [name], MD  05/10/2019 8:44 AM    [facility]

## 2019-05-10 ENCOUNTER — Other Ambulatory Visit: Payer: Self-pay

## 2019-05-10 ENCOUNTER — Encounter: Payer: Self-pay | Admitting: Cardiology

## 2019-05-10 ENCOUNTER — Ambulatory Visit (INDEPENDENT_AMBULATORY_CARE_PROVIDER_SITE_OTHER): Payer: Medicare Other | Admitting: Cardiology

## 2019-05-10 VITALS — BP 120/71 | HR 62 | Temp 97.0°F | Ht 65.0 in | Wt 169.4 lb

## 2019-05-10 DIAGNOSIS — I2510 Atherosclerotic heart disease of native coronary artery without angina pectoris: Secondary | ICD-10-CM | POA: Diagnosis not present

## 2019-05-10 DIAGNOSIS — E785 Hyperlipidemia, unspecified: Secondary | ICD-10-CM

## 2019-05-10 DIAGNOSIS — I10 Essential (primary) hypertension: Secondary | ICD-10-CM | POA: Diagnosis not present

## 2019-05-10 LAB — BASIC METABOLIC PANEL
BUN/Creatinine Ratio: 12 (ref 10–24)
BUN: 10 mg/dL (ref 8–27)
CO2: 21 mmol/L (ref 20–29)
Calcium: 8.9 mg/dL (ref 8.6–10.2)
Chloride: 105 mmol/L (ref 96–106)
Creatinine, Ser: 0.86 mg/dL (ref 0.76–1.27)
GFR calc Af Amer: 101 mL/min/{1.73_m2} (ref >59)
GFR calc non Af Amer: 87 mL/min/{1.73_m2} (ref >59)
Glucose: 102 mg/dL — ABNORMAL HIGH (ref 65–99)
Potassium: 4.1 mmol/L (ref 3.5–5.2)
Sodium: 141 mmol/L (ref 134–144)

## 2019-05-10 LAB — LIPID PANEL
Chol/HDL Ratio: 5.1 ratio — ABNORMAL HIGH (ref 0.0–5.0)
Cholesterol, Total: 97 mg/dL — ABNORMAL LOW (ref 100–199)
HDL: 19 mg/dL — ABNORMAL LOW (ref >39)
LDL Chol Calc (NIH): 37 mg/dL (ref 0–99)
Triglycerides: 267 mg/dL — ABNORMAL HIGH (ref 0–149)
VLDL Cholesterol Cal: 41 mg/dL — ABNORMAL HIGH (ref 5–40)

## 2019-05-10 LAB — HEPATIC FUNCTION PANEL
ALT: 40 IU/L (ref 0–44)
AST: 29 IU/L (ref 0–40)
Albumin: 4.4 g/dL (ref 3.7–4.7)
Alkaline Phosphatase: 55 IU/L (ref 39–117)
Bilirubin Total: 0.5 mg/dL (ref 0.0–1.2)
Bilirubin, Direct: 0.16 mg/dL (ref 0.00–0.40)
Total Protein: 7.3 g/dL (ref 6.0–8.5)

## 2019-08-28 ENCOUNTER — Other Ambulatory Visit: Payer: Self-pay | Admitting: Family Medicine

## 2019-08-29 ENCOUNTER — Other Ambulatory Visit: Payer: Self-pay | Admitting: Cardiology

## 2019-08-31 ENCOUNTER — Other Ambulatory Visit: Payer: Self-pay | Admitting: Adult Health

## 2019-08-31 NOTE — Telephone Encounter (Signed)
Rx(s) sent to pharmacy electronically.  

## 2019-09-18 ENCOUNTER — Other Ambulatory Visit: Payer: Self-pay | Admitting: Adult Health

## 2019-11-03 NOTE — Progress Notes (Signed)
Cardiology Office Note:    Date:  11/08/2019   ID:  Terry Rivers, DOB 1950-08-13, MRN [id number]  PCP:  [name], MD  Cardiologist:  Dr. Smt Lokey [name]    Chief Complaint  Patient presents with  . Coronary Artery Disease    History of Present Illness:     Terry Rivers is a 69 y.o. male with a hx of CAD, bradycardia, DM (diet controlled), DJD.    Admitted 2/28-04/17/15 with inferior STEMI.  LHC demonstrated multivessel CAD with occluded RCA as the culprit. RCA was treated with DES.  He has residual LAD/Dx and LCx disease. He was not started on beta-blocker due to bradycardia. On 05/06/15 he underwent staged PCI of the proximal to mid LCx with a DES. The mid LAD had a 60% stenosis with FFR of 0.9 and was  treated medically.  Has done very well until March 2020 when he began experiencing intermittent exertional chest pain and dyspnea.  He was admitted in May and had repeat cardiac cath showing restenosis in the RCA treated with DES. Stents in the  LCx still patent.  LAD disease unchanged. For BP control his lisinopril dose was increased and amlodipine added.   On follow up today he is doing very well. He is active at home doing yard work. He denies any chest pain or SOB. He rarely has gas after eating certain foods. He has lost 6 lbs. Feels well.    Past Medical History:  Diagnosis Date  . Arthritis   . Benign essential HTN   . Bradycardia    low 50"s per wife  . CAD, multiple vessel-native arteries, emergent PCI RCA with DES 04/15/15 2017   a. 03/2014- inferior STEMI with occluded RCA; b. 04/2015: Staged PCI of LCx c. 07/13/18 PCI to [facility] for ISR  . Chicken pox   . Diabetes mellitus without complication (HCC)    Borderline/no meds  . Frequent headaches   . GERD (gastroesophageal reflux disease)   . Hyperlipidemia LDL goal <70 04/17/2015  . Myocardial infarction ([place]) 03/2015  . Tobacco use 04/17/2015  . Unstable angina ([place]) 06/2018    Past Surgical History:  Procedure  Laterality Date  . CARDIAC CATHETERIZATION N/A 04/15/2015   Procedure: Left Heart Cath and Coronary Angiography;  Surgeon: Mykah Shin M [name], MD;  Location: [place] [facility];  Service: Cardiovascular;  Laterality: N/A;  . CARDIAC CATHETERIZATION N/A 04/15/2015   Procedure: Coronary Stent Intervention;  Surgeon: Ashlee Player M [name], MD;  Location: [place] [facility];  Service: Cardiovascular;  Laterality: N/A;  . CARDIAC CATHETERIZATION N/A 05/06/2015   Procedure: Coronary Stent Intervention;  Surgeon: Kamyiah Colantonio M [name], MD;  Location: [place] [facility];  Service: Cardiovascular;  Laterality: N/A;  . CARDIAC CATHETERIZATION  05/06/2015   Procedure: Coronary/Graft Angiography;  Surgeon: Zooey Schreurs M [name], MD;  Location: [place] [facility];  Service: Cardiovascular;;  . COLONOSCOPY    . CORONARY BALLOON ANGIOPLASTY N/A 07/13/2018   Procedure: CORONARY BALLOON ANGIOPLASTY;  Surgeon: [name], MD;  Location: [place] [facility];  Service: Cardiovascular;  Laterality: N/A;  . CORONARY STENT INTERVENTION N/A 07/13/2018   Procedure: CORONARY STENT INTERVENTION;  Surgeon: [name], MD;  Location: [place] [facility];  Service: Cardiovascular;  Laterality: N/A;  . CORONARY STENT PLACEMENT  05/06/2015   LT CIRC DES X2  . LEFT HEART CATH AND CORONARY ANGIOGRAPHY N/A 07/13/2018   Procedure: LEFT HEART CATH AND CORONARY ANGIOGRAPHY;  Surgeon: [name], MD;  Location: [place] [facility];  Service: Cardiovascular;  Laterality: N/A;  . POLYPECTOMY    . SKIN CANCER EXCISION     right forearm/pre-cancerous/ 2 spots on left arm    Current Medications: Outpatient Medications Prior to Visit  Medication Sig Dispense Refill  . acetaminophen (TYLENOL) 500 MG tablet Take 500-1,000 mg by mouth every 6 (six) hours as needed for mild pain or headache.    Marland Kitchen amLODipine (NORVASC) 5 MG tablet TAKE 1 TABLET BY MOUTH DAILY 90 tablet 3  . aspirin 81 MG chewable tablet Chew 1 tablet (81 mg total) by mouth daily.    Marland Kitchen  atorvastatin (LIPITOR) 80 MG tablet Take 1 tablet (80 mg total) by mouth daily at 6 PM. 90 tablet 2  . clopidogrel (PLAVIX) 75 MG tablet TAKE 1 TABLET (75 MG TOTAL) BY MOUTH DAILY WITH BREAKFAST. 90 tablet 3  . nitroGLYCERIN (NITROSTAT) 0.4 MG SL tablet Place 1 tablet (0.4 mg total) under the tongue every 5 (five) minutes x 3 doses as needed for chest pain. 25 tablet 3  . pantoprazole (PROTONIX) 40 MG tablet TAKE 1 TABLET BY MOUTH DAILY BEFORE BREAKFAST 90 tablet 3  . triamcinolone cream (KENALOG) 0.1 % Apply 1 application topically 2 (two) times daily. (Patient taking differently: Apply 1 application topically 2 (two) times daily as needed (itching or irritation). ) 30 g 1  . lisinopril (ZESTRIL) 20 MG tablet TAKE 1 TABLET BY MOUTH DAILY 90 tablet 3   No facility-administered medications prior to visit.     Allergies:   Penicillins and Prednisone   Social History   Socioeconomic History  . Marital status: Married    Spouse name: Not on file  . Number of children: Not on file  . Years of education: Not on file  . Highest education level: Not on file  Occupational History  . Not on file  Tobacco Use  . Smoking status: Never Smoker  . Smokeless tobacco: Current User    Types: Chew  . Tobacco comment: chew every day  Vaping Use  . Vaping Use: Never used  Substance and Sexual Activity  . Alcohol use: No    Alcohol/week: 0.0 standard drinks  . Drug use: No  . Sexual activity: Not on file  Other Topics Concern  . Not on file  Social History Narrative  . Not on file   Social Determinants of Health   Financial Resource Strain:   . Difficulty of Paying Living Expenses: Not on file  Food Insecurity:   . Worried About Charity fundraiser in the Last Year: Not on file  . Ran Out of Food in the Last Year: Not on file  Transportation Needs:   . Lack of Transportation (Medical): Not on file  . Lack of Transportation (Non-Medical): Not on file  Physical Activity:   . Days of  Exercise per Week: Not on file  . Minutes of Exercise per Session: Not on file  Stress:   . Feeling of Stress : Not on file  Social Connections:   . Frequency of Communication with Friends and Family: Not on file  . Frequency of Social Gatherings with Friends and Family: Not on file  . Attends Religious Services: Not on file  . Active Member of Clubs or Organizations: Not on file  . Attends Archivist Meetings: Not on file  . Marital Status: Not on file     Family History:  The patient's family history includes Brain cancer in his sister;  Cancer in his brother and father; Colon cancer in his brother and father; Kidney cancer in his brother, sister, sister, sister, and sister; Stroke in his mother.   ROS:   Please see the history of present illness.    ROSAll other systems reviewed and are negative.   Physical Exam:    VS:  BP (!) 150/70   Pulse (!) 48   Temp (!) 97.5 F (36.4 C)   Ht _0  (1.651 m)   Wt 163 lb 9.6 oz (74.2 kg)   SpO2 97%   BMI 27.22 kg/m    GENERAL:  Well appearing WM in NAD HEENT:  PERRL, EOMI, sclera are clear. Oropharynx is clear. NECK:  No jugular venous distention, carotid upstroke brisk and symmetric, no bruits, no thyromegaly or adenopathy LUNGS:  Clear to auscultation bilaterally CHEST:  Unremarkable HEART:  RRR,  PMI not displaced or sustained,S1 and S2 within normal limits, no S3, no S4: no clicks, no rubs, no murmurs ABD:  Soft, nontender. BS +, no masses or bruits. No hepatomegaly, no splenomegaly EXT:  2 + pulses throughout, no edema, no cyanosis no clubbing SKIN:  Warm and dry.  No rashes NEURO:  Alert and oriented x 3. Cranial nerves II through XII intact. PSYCH:  Cognitively intact      Wt Readings from Last 3 Encounters:  11/08/19 163 lb 9.6 oz (74.2 kg)  05/10/19 169 lb 6.4 oz (76.8 kg)  10/31/18 157 lb 3.2 oz (71.3 kg)      Studies/Labs Reviewed:     EKG:  EKG is ordered today. Sinus brady rate 48. Mild nonspecific  ST abnormality. I have personally reviewed and interpreted this study.   Recent Labs: 05/10/2019: ALT 40; BUN 10; Creatinine, Ser 0.86; Potassium 4.1; Sodium 141   Recent Lipid Panel    Component Value Date/Time   CHOL 97 (L) 05/10/2019 0850   TRIG 267 (H) 05/10/2019 0850   HDL 19 (L) 05/10/2019 0850   CHOLHDL 5.1 (H) 05/10/2019 0850   CHOLHDL 4.9 07/13/2018 0513   VLDL 47 (H) 07/13/2018 0513   LDLCALC 37 05/10/2019 0850   LDLDIRECT 129.0 05/24/2014 0946    Additional studies/ records that were reviewed today include:   Echo 07/13/18: IMPRESSIONS    1. The left ventricle has normal systolic function with an ejection fraction of 60-65%. The cavity size was normal. Left ventricular diastolic Doppler parameters are consistent with impaired relaxation. No evidence of left ventricular regional wall  motion abnormalities.  2. The right ventricle has normal systolic function. The cavity was normal. There is no increase in right ventricular wall thickness.   Cardiac cath/PCI 07/13/18: CORONARY BALLOON ANGIOPLASTY  CORONARY STENT INTERVENTION  LEFT HEART CATH AND CORONARY ANGIOGRAPHY  Conclusion    Prox RCA to Mid RCA lesion is 90% stenosed.  Mid RCA lesion is 60% stenosed.  Previously placed Ost Cx to Mid Cx stent (unknown type) is widely patent.  Prox LAD lesion is 50% stenosed.  Ost 1st Diag to 1st Diag lesion is 50% stenosed.  Post intervention, there is a 25% residual stenosis.  A stent was successfully placed.  Post intervention, there is a 25% residual stenosis.  The left ventricular systolic function is normal.  LV end diastolic pressure is normal.  The left ventricular ejection fraction is 50-55% by visual estimate.   Terry Rivers is a 69 y.o. male    [id number] LOCATION:  FACILITY: [facility]  PHYSICIAN: [name], M.D. February 29, 1950   DATE OF PROCEDURE:  07/13/2018  DATE OF DISCHARGE:     CARDIAC CATHETERIZATION / PCI DES  RCA    History obtained from chart review.Ayrton P Hopperis a 69 y.o.malewith a hx of CADs/p PCIto RCA;staged PCI to P LCx3/2017, bradycardia, DM2(diet controlled)andDJDwho presented to [facility] with chest pain and SOB.   PROCEDURE DESCRIPTION:   The patient was brought to the second floor [facility]iac cath lab in the postabsorptive state. He was not premedicated . His right wrist was prepped and shaved in usual sterile fashion. Xylocaine 1% was used  for local anesthesia. A 6 French sheath was inserted into the right radial artery using standard Seldinger technique. The patient received 3500 units  of heparin intravenously.  A 5 [place] TIG catheter and pigtail catheters were used for selective coronary angiography and left ventriculography respectively.  Isovue dye was used for the entirety of the case.  Retrograde aorta, ventricular and pullback pressures were recorded.  Radial cocktail was administered via the SideArm sheath.  The patient received an additional 6500 units of heparin followed by 2000 units of heparin with an ACT in the high 200 range.  The patient received 600 mg of p.o. Plavix in addition to 20 mg of IV Pepcid.  Using a 6 [place] AL 0.75 guide catheter along with a whisper wire I was able to cross the proximal band and lesion with some difficulty ultimately placing the wire in the distal PLA branch.  I then predilated the proximal lesion with a 2 oh balloon but was unable to pass a stent because of tortuosity.  I then placed a guide liner just proximal to the lesion and was able to pass a three 5 x 12 mm Synergy drug-eluting stent across the proximal in-stent restenosis deployed at 18 atm.  I then used a 04/20/2010 balloon to dilate the mid 50 to 60% lesion as well as post dilate the stent.  There was still mild "dog boning" within the Synergy stent in the proximal portion with residual 25% stenosis.  The guidewire and guide liner were removed and  completion angiography was performed.  The guide catheter was removed from the body, the sheath was removed and a TR band was placed on the right wrist to achieve patent hemostasis.  The patient left lab in stable condition.   IMPRESSION: Successful balloon angioplasty of mid dominant RCA in-stent restenosis and restenting of the proximal RCA for high-grade in-stent restenosis with acceptable results.  The previously placed circumflex stent was widely open and the LAD had at most moderate disease unchanged from his prior angiogram 3 years ago.  His LV function was normal with a low LVEDP.  He will need dual antiplatelet platelet therapy uninterrupted for at least 12 months.  [name]. MD, [facility] 07/13/2018 10:57 AM          ASSESSMENT:     1. Coronary artery disease involving native coronary artery of native heart without angina pectoris   2. Hyperlipidemia LDL goal <70   3. Essential hypertension     PLAN:     In order of problems listed above:  1. CAD - s/p inf STEMI in March 2017 tx with DES to RCA.  Staged PCI of the proximal to mid LCx with DES.  Recurrent symptoms in May 2020. S/p repeat PCI of the RCA with DES.  Continue ASA and Plavix indefinitely given multiple stents.  Continue  statin.   No beta blocker due to history of bradycardia. He is currently asymptomatic.  2. HTN - BP is elevated. Recommend increasing lisinopril to 40 mg daily. Continue amlodipine 5 mg daily.   3. HL - Continue statin. LDL at goal. Triglycerides elevated. Encourage dietary modification and weight control.      Medication Adjustments/Labs and Tests Ordered: Current medicines are reviewed at length with the patient today.  Concerns regarding medicines are outlined above.  Medication changes, Labs and Tests ordered today are outlined in the Patient Instructions noted below. Patient Instructions  Increase lisinopril 40 mg daily   Continue your other therapy     Signed, Celestine Prim  [name], MD  11/08/2019 8:35 AM    [facility]

## 2019-11-08 ENCOUNTER — Encounter: Payer: Self-pay | Admitting: Cardiology

## 2019-11-08 ENCOUNTER — Other Ambulatory Visit: Payer: Self-pay

## 2019-11-08 ENCOUNTER — Ambulatory Visit (INDEPENDENT_AMBULATORY_CARE_PROVIDER_SITE_OTHER): Payer: Medicare Other | Admitting: Cardiology

## 2019-11-08 VITALS — BP 150/70 | HR 48 | Temp 97.5°F | Ht 65.0 in | Wt 163.6 lb

## 2019-11-08 DIAGNOSIS — E785 Hyperlipidemia, unspecified: Secondary | ICD-10-CM

## 2019-11-08 DIAGNOSIS — I2510 Atherosclerotic heart disease of native coronary artery without angina pectoris: Secondary | ICD-10-CM | POA: Diagnosis not present

## 2019-11-08 DIAGNOSIS — I10 Essential (primary) hypertension: Secondary | ICD-10-CM | POA: Diagnosis not present

## 2019-11-08 MED ORDER — LISINOPRIL 40 MG PO TABS: 40.0000 mg | ORAL_TABLET | Freq: Every day | ORAL | 3 refills | Status: DC

## 2019-11-08 NOTE — Patient Instructions (Signed)
Increase lisinopril 40 mg daily   Continue your other therapy

## 2019-11-29 ENCOUNTER — Ambulatory Visit (INDEPENDENT_AMBULATORY_CARE_PROVIDER_SITE_OTHER): Payer: Medicare Other | Admitting: *Deleted

## 2019-11-29 ENCOUNTER — Other Ambulatory Visit: Payer: Self-pay

## 2019-11-29 DIAGNOSIS — Z23 Encounter for immunization: Secondary | ICD-10-CM

## 2019-12-22 DIAGNOSIS — Z23 Encounter for immunization: Secondary | ICD-10-CM | POA: Diagnosis not present

## 2020-01-27 ENCOUNTER — Other Ambulatory Visit: Payer: Self-pay | Admitting: Cardiology

## 2020-03-28 ENCOUNTER — Ambulatory Visit: Payer: Medicare Other

## 2020-04-14 ENCOUNTER — Ambulatory Visit: Payer: Medicare Other

## 2020-04-21 ENCOUNTER — Other Ambulatory Visit: Payer: Self-pay

## 2020-04-21 ENCOUNTER — Ambulatory Visit (INDEPENDENT_AMBULATORY_CARE_PROVIDER_SITE_OTHER): Payer: Medicare Other | Admitting: Family Medicine

## 2020-04-21 ENCOUNTER — Encounter: Payer: Self-pay | Admitting: Family Medicine

## 2020-04-21 VITALS — BP 132/80 | HR 79 | Temp 98.6°F | Ht 65.0 in | Wt 164.0 lb

## 2020-04-21 DIAGNOSIS — I10 Essential (primary) hypertension: Secondary | ICD-10-CM | POA: Diagnosis not present

## 2020-04-21 DIAGNOSIS — E785 Hyperlipidemia, unspecified: Secondary | ICD-10-CM

## 2020-04-21 DIAGNOSIS — R1032 Left lower quadrant pain: Secondary | ICD-10-CM | POA: Diagnosis not present

## 2020-04-21 LAB — BASIC METABOLIC PANEL
BUN: 13 mg/dL (ref 6–23)
CO2: 25 mEq/L (ref 19–32)
Calcium: 9.2 mg/dL (ref 8.4–10.5)
Chloride: 105 mEq/L (ref 96–112)
Creatinine, Ser: 1.01 mg/dL (ref 0.40–1.50)
GFR: 74.53 mL/min (ref >60.00)
Glucose, Bld: 107 mg/dL — ABNORMAL HIGH (ref 70–99)
Potassium: 4 mEq/L (ref 3.5–5.1)
Sodium: 140 mEq/L (ref 135–145)

## 2020-04-21 LAB — CBC WITH DIFFERENTIAL/PLATELET
Basophils Absolute: 0 10*3/uL (ref 0.0–0.1)
Basophils Relative: 0.4 % (ref 0.0–3.0)
Eosinophils Absolute: 0.1 10*3/uL (ref 0.0–0.7)
Eosinophils Relative: 1.1 % (ref 0.0–5.0)
HCT: 42.1 % (ref 39.0–52.0)
Hemoglobin: 14.9 g/dL (ref 13.0–17.0)
Lymphocytes Relative: 26.6 % (ref 12.0–46.0)
Lymphs Abs: 2.6 10*3/uL (ref 0.7–4.0)
MCHC: 35.5 g/dL (ref 30.0–36.0)
MCV: 88.8 fl (ref 78.0–100.0)
Monocytes Absolute: 1.1 10*3/uL — ABNORMAL HIGH (ref 0.1–1.0)
Monocytes Relative: 11.3 % (ref 3.0–12.0)
Neutro Abs: 6 10*3/uL (ref 1.4–7.7)
Neutrophils Relative %: 60.6 % (ref 43.0–77.0)
Platelets: 271 10*3/uL (ref 150.0–400.0)
RBC: 4.74 Mil/uL (ref 4.22–5.81)
RDW: 13.3 % (ref 11.5–15.5)
WBC: 10 10*3/uL (ref 4.0–10.5)

## 2020-04-21 LAB — LIPID PANEL
Cholesterol: 109 mg/dL (ref 0–200)
HDL: 21.3 mg/dL — ABNORMAL LOW (ref >39.00)
NonHDL: 88.12
Total CHOL/HDL Ratio: 5
Triglycerides: 374 mg/dL — ABNORMAL HIGH (ref 0.0–149.0)
VLDL: 74.8 mg/dL — ABNORMAL HIGH (ref 0.0–40.0)

## 2020-04-21 LAB — HEPATIC FUNCTION PANEL
ALT: 29 U/L (ref 0–53)
AST: 22 U/L (ref 0–37)
Albumin: 4.2 g/dL (ref 3.5–5.2)
Alkaline Phosphatase: 47 U/L (ref 39–117)
Bilirubin, Direct: 0.1 mg/dL (ref 0.0–0.3)
Total Bilirubin: 0.6 mg/dL (ref 0.2–1.2)
Total Protein: 7.3 g/dL (ref 6.0–8.3)

## 2020-04-21 LAB — LDL CHOLESTEROL, DIRECT: Direct LDL: 44 mg/dL

## 2020-04-21 MED ORDER — CIPROFLOXACIN HCL 500 MG PO TABS: 500.0000 mg | ORAL_TABLET | Freq: Two times a day (BID) | ORAL | 0 refills | Status: DC

## 2020-04-21 MED ORDER — METRONIDAZOLE 500 MG PO TABS: 500.0000 mg | ORAL_TABLET | Freq: Three times a day (TID) | ORAL | 0 refills | Status: DC

## 2020-04-21 NOTE — Progress Notes (Signed)
Established Patient Office Visit  Subjective:  Patient ID: Terry Rivers, male    DOB: 26-Jan-1951  Age: 70 y.o. MRN: [id number]  CC:  Chief Complaint  Patient presents with  . Groin Pain    Started on fridady , pain with setting up,feels tender ans sore , taking otc tylenol helping some     HPI Terry Rivers presents for initial complaint of left "groin" pain.  Pain is actually not in his groin region that is really more left lower quadrant.  He does have known osteoarthritis of the left hip but this pain is not in the hip area.  He does have history of diverticular disease of most recent colonoscopy 2019 but no known history of diverticulitis.  No fever.  No appetite changes.  Pain is sometimes worse with position change.  Symptoms started a week ago Saturday.  Essentially unchanged.  No alleviating factors.  He has allergy to penicillin.  Denies any stool changes.  He is not describing any pain in the hip joint region with ambulation.  He has not noted any recent bulge to suggest hernia.  He has history of CAD, hypertension, hyperlipidemia.  He has follow-up with cardiology soon.  He would like to go ahead and get labs today if possible.  He is due for follow-up labs including lipids, basic metabolic panel, hepatic panel.  Past Medical History:  Diagnosis Date  . Arthritis   . Benign essential HTN   . Bradycardia    low 50"s per wife  . CAD, multiple vessel-native arteries, emergent PCI RCA with DES 04/15/15 2017   a. 03/2014- inferior STEMI with occluded RCA; b. 04/2015: Staged PCI of LCx c. 07/13/18 PCI to [facility] for ISR  . Chicken pox   . Diabetes mellitus without complication (HCC)    Borderline/no meds  . Frequent headaches   . GERD (gastroesophageal reflux disease)   . Hyperlipidemia LDL goal <70 04/17/2015  . Myocardial infarction ([place]) 03/2015  . Tobacco use 04/17/2015  . Unstable angina ([place]) 06/2018    Past Surgical History:  Procedure Laterality Date  . CARDIAC  CATHETERIZATION N/A 04/15/2015   Procedure: Left Heart Cath and Coronary Angiography;  Surgeon: Peter M [name], MD;  Location: [place] [facility];  Service: Cardiovascular;  Laterality: N/A;  . CARDIAC CATHETERIZATION N/A 04/15/2015   Procedure: Coronary Stent Intervention;  Surgeon: Peter M [name], MD;  Location: [place] [facility];  Service: Cardiovascular;  Laterality: N/A;  . CARDIAC CATHETERIZATION N/A 05/06/2015   Procedure: Coronary Stent Intervention;  Surgeon: Peter M [name], MD;  Location: [place] [facility];  Service: Cardiovascular;  Laterality: N/A;  . CARDIAC CATHETERIZATION  05/06/2015   Procedure: Coronary/Graft Angiography;  Surgeon: Peter M [name], MD;  Location: [place] [facility];  Service: Cardiovascular;;  . COLONOSCOPY    . CORONARY BALLOON ANGIOPLASTY N/A 07/13/2018   Procedure: CORONARY BALLOON ANGIOPLASTY;  Surgeon: [name], MD;  Location: [place] [facility];  Service: Cardiovascular;  Laterality: N/A;  . CORONARY STENT INTERVENTION N/A 07/13/2018   Procedure: CORONARY STENT INTERVENTION;  Surgeon: [name], MD;  Location: [place] [facility];  Service: Cardiovascular;  Laterality: N/A;  . CORONARY STENT PLACEMENT  05/06/2015   LT CIRC DES X2  . LEFT HEART CATH AND CORONARY ANGIOGRAPHY N/A 07/13/2018   Procedure: LEFT HEART CATH AND CORONARY ANGIOGRAPHY;  Surgeon: [name], MD;  Location: [place] [facility];  Service: Cardiovascular;  Laterality: N/A;  . POLYPECTOMY    .  SKIN CANCER EXCISION     right forearm/pre-cancerous/ 2 spots on left arm    Family History  Problem Relation Age of Onset  . Stroke Mother   . Cancer Father        colon, prostate  . Colon cancer Father   . Cancer Brother        lung  . Colon cancer Brother   . Kidney cancer Brother   . Kidney cancer Sister   . Brain cancer Sister   . Kidney cancer Sister   . Kidney cancer Sister   . Kidney cancer Sister     Social History   Socioeconomic History  . Marital  status: Married    Spouse name: Not on file  . Number of children: Not on file  . Years of education: Not on file  . Highest education level: Not on file  Occupational History  . Not on file  Tobacco Use  . Smoking status: Never Smoker  . Smokeless tobacco: Current User    Types: Chew  . Tobacco comment: chew every day  Vaping Use  . Vaping Use: Never used  Substance and Sexual Activity  . Alcohol use: No    Alcohol/week: 0.0 standard drinks  . Drug use: No  . Sexual activity: Not on file  Other Topics Concern  . Not on file  Social History Narrative  . Not on file   Social Determinants of Health   Financial Resource Strain: Not on file  Food Insecurity: Not on file  Transportation Needs: Not on file  Physical Activity: Not on file  Stress: Not on file  Social Connections: Not on file  Intimate Partner Violence: Not on file    Outpatient Medications Prior to Visit  Medication Sig Dispense Refill  . acetaminophen (TYLENOL) 500 MG tablet Take 500-1,000 mg by mouth every 6 (six) hours as needed for mild pain or headache.    Marland Kitchen amLODipine (NORVASC) 5 MG tablet TAKE 1 TABLET BY MOUTH DAILY 90 tablet 3  . aspirin 81 MG chewable tablet Chew 1 tablet (81 mg total) by mouth daily.    Marland Kitchen atorvastatin (LIPITOR) 80 MG tablet Take 1 tablet (80 mg total) by mouth daily at 6 PM. 90 tablet 2  . clopidogrel (PLAVIX) 75 MG tablet TAKE 1 TABLET (75 MG TOTAL) BY MOUTH DAILY WITH BREAKFAST. 90 tablet 3  . lisinopril (ZESTRIL) 20 MG tablet TAKE 1 TABLET BY MOUTH DAILY 90 tablet 3  . lisinopril (ZESTRIL) 40 MG tablet Take 1 tablet (40 mg total) by mouth daily. 90 tablet 3  . nitroGLYCERIN (NITROSTAT) 0.4 MG SL tablet Place 1 tablet (0.4 mg total) under the tongue every 5 (five) minutes x 3 doses as needed for chest pain. 25 tablet 3  . pantoprazole (PROTONIX) 40 MG tablet TAKE 1 TABLET BY MOUTH DAILY BEFORE BREAKFAST 90 tablet 3  . triamcinolone cream (KENALOG) 0.1 % Apply 1 application  topically 2 (two) times daily. (Patient taking differently: Apply 1 application topically 2 (two) times daily as needed (itching or irritation).) 30 g 1   No facility-administered medications prior to visit.    Allergies  Allergen Reactions  . Penicillins Anaphylaxis, Shortness Of Breath and Swelling    Joints became swollen /turned purple Did it involve swelling of the face/tongue/throat, SOB, or low BP? No Did it involve sudden or severe rash/hives, skin peeling, or any reaction on the inside of your mouth or nose? No Did you need to seek medical attention at a  hospital or doctor's office? Yes When did it last happen? "Many years ago" If all above answers are "NO", may proceed with cephalosporin use.    . Prednisone Swelling and Other (See Comments)    Made the patient's joints swell to the point where he could not walk    ROS Review of Systems  Constitutional: Negative for appetite change, chills, fever and unexpected weight change.  Respiratory: Negative for cough.   Cardiovascular: Negative for chest pain.  Gastrointestinal: Positive for abdominal pain. Negative for blood in stool, diarrhea, nausea and vomiting.  Genitourinary: Negative for dysuria.  Musculoskeletal: Negative for back pain.      Objective:    Physical Exam Vitals reviewed.  Constitutional:      Appearance: Normal appearance.  Cardiovascular:     Rate and Rhythm: Normal rate and regular rhythm.  Pulmonary:     Effort: Pulmonary effort is normal.     Breath sounds: Normal breath sounds.  Abdominal:     General: Bowel sounds are normal.     Palpations: Abdomen is soft.     Tenderness: There is abdominal tenderness.     Comments: He has some mild tenderness to palpation left lower quadrant.  No guarding or rebound.  No masses noted.  No right-sided tenderness.  Neurological:     Mental Status: He is alert.     BP 132/80 (BP Location: Left Arm, Patient Position: Sitting, Cuff Size: Normal)    Pulse 79   Temp 98.6 F (37 C)   Ht 5\' 5"  (1.651 m)   Wt 164 lb (74.4 kg)   SpO2 98%   BMI 27.29 kg/m  Wt Readings from Last 3 Encounters:  04/21/20 164 lb (74.4 kg)  11/08/19 163 lb 9.6 oz (74.2 kg)  05/10/19 169 lb 6.4 oz (76.8 kg)     Health Maintenance Due  Topic Date Due  . COVID-19 Vaccine (1) Never done    There are no preventive care reminders to display for this patient.  Lab Results  Component Value Date   TSH 3.419 04/15/2015   Lab Results  Component Value Date   WBC 10.5 07/14/2018   HGB 13.0 07/14/2018   HCT 37.6 (L) 07/14/2018   MCV 89.5 07/14/2018   PLT 189 07/14/2018   Lab Results  Component Value Date   NA 141 05/10/2019   K 4.1 05/10/2019   CO2 21 05/10/2019   GLUCOSE 102 (H) 05/10/2019   BUN 10 05/10/2019   CREATININE 0.86 05/10/2019   BILITOT 0.5 05/10/2019   ALKPHOS 55 05/10/2019   AST 29 05/10/2019   ALT 40 05/10/2019   PROT 7.3 05/10/2019   ALBUMIN 4.4 05/10/2019   CALCIUM 8.9 05/10/2019   ANIONGAP 8 07/14/2018   GFR 89.69 05/24/2014   Lab Results  Component Value Date   CHOL 97 (L) 05/10/2019   Lab Results  Component Value Date   HDL 19 (L) 05/10/2019   Lab Results  Component Value Date   LDLCALC 37 05/10/2019   Lab Results  Component Value Date   TRIG 267 (H) 05/10/2019   Lab Results  Component Value Date   CHOLHDL 5.1 (H) 05/10/2019   Lab Results  Component Value Date   HGBA1C 5.8 (H) 04/16/2015      Assessment & Plan:   #1 abdominal pain left lower quadrant.  Suspect acute diverticulitis.  Nonacute abdomen.  -Start Cipro 500 mg twice daily for 10 days and Flagyl 500 mg 3 times daily for 10 days -Follow-up  promptly if symptoms not fully resolved over the next week and sooner for any fever, vomiting, worsening abdominal pain -We explained that we could obtain CT abdomen pelvis to confirm but would not pursue at this point and less symptoms not improving promptly with antibiotics  #2 hypertension -Check  basic metabolic panel  #3 dyslipidemia.  Goal LDL less than 70 with his history of CAD -Recheck fasting lipid and hepatic panel  Meds ordered this encounter  Medications  . ciprofloxacin (CIPRO) 500 MG tablet    Sig: Take 1 tablet (500 mg total) by mouth 2 (two) times daily.    Dispense:  20 tablet    Refill:  0  . metroNIDAZOLE (FLAGYL) 500 MG tablet    Sig: Take 1 tablet (500 mg total) by mouth 3 (three) times daily.    Dispense:  30 tablet    Refill:  0    Follow-up: No follow-ups on file.    Carolann Littler, MD

## 2020-04-21 NOTE — Patient Instructions (Signed)
Diverticulitis  Diverticulitis is infection or inflammation of small pouches (diverticula) in the colon that form due to a condition called diverticulosis. Diverticula can trap stool (feces) and bacteria, causing infection and inflammation. Diverticulitis may cause severe stomach pain and diarrhea. It may lead to tissue damage in the colon that causes bleeding or blockage. The diverticula may also burst (rupture) and cause infected stool to enter other areas of the abdomen. What are the causes? This condition is caused by stool becoming trapped in the diverticula, which allows bacteria to grow in the diverticula. This leads to inflammation and infection. What increases the risk? You are more likely to develop this condition if you have diverticulosis. The risk increases if you:  Are overweight or obese.  Do not get enough exercise.  Drink alcohol.  Use tobacco products.  Eat a diet that has a lot of red meat such as beef, pork, or lamb.  Eat a diet that does not include enough fiber. High-fiber foods include fruits, vegetables, beans, nuts, and whole grains.  Are over 40 years of age. What are the signs or symptoms? Symptoms of this condition may include:  Pain and tenderness in the abdomen. The pain is normally located on the left side of the abdomen, but it may occur in other areas.  Fever and chills.  Nausea.  Vomiting.  Cramping.  Bloating.  Changes in bowel routines.  Blood in your stool. How is this diagnosed? This condition is diagnosed based on:  Your medical history.  A physical exam.  Tests to make sure there is nothing else causing your condition. These tests may include: ? Blood tests. ? Urine tests. ? CT scan of the abdomen. How is this treated? Most cases of this condition are mild and can be treated at home. Treatment may include:  Taking over-the-counter pain medicines.  Following a clear liquid diet.  Taking antibiotic medicines by  mouth.  Resting. More severe cases may need to be treated at a hospital. Treatment may include:  Not eating or drinking.  Taking prescription pain medicine.  Receiving antibiotic medicines through an IV.  Receiving fluids and nutrition through an IV.  Surgery. When your condition is under control, your health care provider may recommend that you have a colonoscopy. This is an exam to look at the entire large intestine. During the exam, a lubricated, bendable tube is inserted into the anus and then passed into the rectum, colon, and other parts of the large intestine. A colonoscopy can show how severe your diverticula are and whether something else may be causing your symptoms. Follow these instructions at home: Medicines  Take over-the-counter and prescription medicines only as told by your health care provider. These include fiber supplements, probiotics, and stool softeners.  If you were prescribed an antibiotic medicine, take it as told by your health care provider. Do not stop taking the antibiotic even if you start to feel better.  Ask your health care provider if the medicine prescribed to you requires you to avoid driving or using machinery. Eating and drinking  Follow a full liquid diet or another diet as directed by your health care provider.  After your symptoms improve, your health care provider may tell you to change your diet. He or she may recommend that you eat a diet that contains at least 25 grams (25 g) of fiber daily. Fiber makes it easier to pass stool. Healthy sources of fiber include: ? Berries. One cup contains 4-8 grams of fiber. ? Beans   or lentils. One-half cup contains 5-8 grams of fiber. ? Green vegetables. One cup contains 4 grams of fiber.  Avoid eating red meat.   General instructions  Do not use any products that contain nicotine or tobacco, such as cigarettes, e-cigarettes, and chewing tobacco. If you need help quitting, ask your health care  provider.  Exercise for at least 30 minutes, 3 times each week. You should exercise hard enough to raise your heart rate and break a sweat.  Keep all follow-up visits as told by your health care provider. This is important. You may need to have a colonoscopy. Contact a health care provider if:  Your pain does not improve.  Your bowel movements do not return to normal. Get help right away if:  Your pain gets worse.  Your symptoms do not get better with treatment.  Your symptoms suddenly get worse.  You have a fever.  You vomit more than one time.  You have stools that are bloody, black, or tarry. Summary  Diverticulitis is infection or inflammation of small pouches (diverticula) in the colon that form due to a condition called diverticulosis. Diverticula can trap stool (feces) and bacteria, causing infection and inflammation.  You are at higher risk for this condition if you have diverticulosis and you eat a diet that does not include enough fiber.  Most cases of this condition are mild and can be treated at home. More severe cases may need to be treated at a hospital.  When your condition is under control, your health care provider may recommend that you have an exam called a colonoscopy. This exam can show how severe your diverticula are and whether something else may be causing your symptoms.  Keep all follow-up visits as told by your health care provider. This is important. This information is not intended to replace advice given to you by your health care provider. Make sure you discuss any questions you have with your health care provider. Document Revised: 11/13/2018 Document Reviewed: 11/13/2018 Elsevier Patient Education  2021 Elsevier Inc.  

## 2020-05-04 NOTE — Progress Notes (Unsigned)
Cardiology Office Note:    Date:  05/08/2020   ID:  Terry Rivers, DOB 21-Apr-1950, MRN [id number]  PCP:  [name], MD  Cardiologist:  Dr. Peter [name]    Chief Complaint  Patient presents with  . Coronary Artery Disease    History of Present Illness:     Terry Rivers is a 70 y.o. male with a hx of CAD, bradycardia, DM (diet controlled), DJD.    Admitted 2/28-04/17/15 with inferior STEMI.  LHC demonstrated multivessel CAD with occluded RCA as the culprit. RCA was treated with DES.  He has residual LAD/Dx and LCx disease. He was not started on beta-blocker due to bradycardia. On 05/06/15 he underwent staged PCI of the proximal to mid LCx with a DES. The mid LAD had a 60% stenosis with FFR of 0.9 and was  treated medically.  Has done very well until March 2020 when he began experiencing intermittent exertional chest pain and dyspnea.  He was admitted in May and had repeat cardiac cath showing restenosis in the RCA treated with DES. Stents in the  LCx still patent.  LAD disease unchanged. For BP control his lisinopril dose was increased and amlodipine added.   On follow up today he is doing very well. He is active at home doing yard work. Just putting in his garden. He denies any chest pain or SOB. He did have some lower abdominal pain recently and seen by Dr Elease Hashimoto. Placed on Cipro for presumed diverticultis and pain resolved.   Past Medical History:  Diagnosis Date  . Arthritis   . Benign essential HTN   . Bradycardia    low 50"s per wife  . CAD, multiple vessel-native arteries, emergent PCI RCA with DES 04/15/15 2017   a. 03/2014- inferior STEMI with occluded RCA; b. 04/2015: Staged PCI of LCx c. 07/13/18 PCI to [facility] for ISR  . Chicken pox   . Diabetes mellitus without complication (HCC)    Borderline/no meds  . Frequent headaches   . GERD (gastroesophageal reflux disease)   . Hyperlipidemia LDL goal <70 04/17/2015  . Myocardial infarction ([place]) 03/2015  . Tobacco use  04/17/2015  . Unstable angina ([place]) 06/2018    Past Surgical History:  Procedure Laterality Date  . CARDIAC CATHETERIZATION N/A 04/15/2015   Procedure: Left Heart Cath and Coronary Angiography;  Surgeon: Peter M [name], MD;  Location: [place] [facility];  Service: Cardiovascular;  Laterality: N/A;  . CARDIAC CATHETERIZATION N/A 04/15/2015   Procedure: Coronary Stent Intervention;  Surgeon: Peter M [name], MD;  Location: [place] [facility];  Service: Cardiovascular;  Laterality: N/A;  . CARDIAC CATHETERIZATION N/A 05/06/2015   Procedure: Coronary Stent Intervention;  Surgeon: Peter M [name], MD;  Location: [place] [facility];  Service: Cardiovascular;  Laterality: N/A;  . CARDIAC CATHETERIZATION  05/06/2015   Procedure: Coronary/[place] Angiography;  Surgeon: Peter M [name], MD;  Location: [place] [facility];  Service: Cardiovascular;;  . COLONOSCOPY    . CORONARY BALLOON ANGIOPLASTY N/A 07/13/2018   Procedure: CORONARY BALLOON ANGIOPLASTY;  Surgeon: [name], MD;  Location: [place] [facility];  Service: Cardiovascular;  Laterality: N/A;  . CORONARY STENT INTERVENTION N/A 07/13/2018   Procedure: CORONARY STENT INTERVENTION;  Surgeon: [name], MD;  Location: [place] [facility];  Service: Cardiovascular;  Laterality: N/A;  . CORONARY STENT PLACEMENT  05/06/2015   LT CIRC DES X2  . LEFT HEART CATH AND CORONARY ANGIOGRAPHY N/A 07/13/2018   Procedure: LEFT  HEART CATH AND CORONARY ANGIOGRAPHY;  Surgeon: [name], MD;  Location: [place] [facility];  Service: Cardiovascular;  Laterality: N/A;  . POLYPECTOMY    . SKIN CANCER EXCISION     right forearm/pre-cancerous/ 2 spots on left arm    Current Medications: Outpatient Medications Prior to Visit  Medication Sig Dispense Refill  . acetaminophen (TYLENOL) 500 MG tablet Take 500-1,000 mg by mouth every 6 (six) hours as needed for mild pain or headache.    Marland Kitchen amLODipine (NORVASC) 5 MG tablet TAKE 1 TABLET BY MOUTH DAILY 90 tablet 3   . aspirin 81 MG chewable tablet Chew 1 tablet (81 mg total) by mouth daily.    Marland Kitchen atorvastatin (LIPITOR) 80 MG tablet Take 1 tablet (80 mg total) by mouth daily at 6 PM. 90 tablet 2  . clopidogrel (PLAVIX) 75 MG tablet TAKE 1 TABLET (75 MG TOTAL) BY MOUTH DAILY WITH BREAKFAST. 90 tablet 3  . lisinopril (ZESTRIL) 20 MG tablet TAKE 1 TABLET BY MOUTH DAILY 90 tablet 3  . lisinopril (ZESTRIL) 40 MG tablet Take 1 tablet (40 mg total) by mouth daily. 90 tablet 3  . nitroGLYCERIN (NITROSTAT) 0.4 MG SL tablet Place 1 tablet (0.4 mg total) under the tongue every 5 (five) minutes x 3 doses as needed for chest pain. 25 tablet 3  . pantoprazole (PROTONIX) 40 MG tablet TAKE 1 TABLET BY MOUTH DAILY BEFORE BREAKFAST 90 tablet 3  . metroNIDAZOLE (FLAGYL) 500 MG tablet Take 1 tablet (500 mg total) by mouth 3 (three) times daily. 30 tablet 0  . ciprofloxacin (CIPRO) 500 MG tablet Take 1 tablet (500 mg total) by mouth 2 (two) times daily. 20 tablet 0   No facility-administered medications prior to visit.     Allergies:   Penicillins and Prednisone   Social History   Socioeconomic History  . Marital status: Married    Spouse name: Not on file  . Number of children: Not on file  . Years of education: Not on file  . Highest education level: Not on file  Occupational History  . Not on file  Tobacco Use  . Smoking status: Never Smoker  . Smokeless tobacco: Current User    Types: Chew  . Tobacco comment: chew every day  Vaping Use  . Vaping Use: Never used  Substance and Sexual Activity  . Alcohol use: No    Alcohol/week: 0.0 standard drinks  . Drug use: No  . Sexual activity: Not on file  Other Topics Concern  . Not on file  Social History Narrative  . Not on file   Social Determinants of Health   Financial Resource Strain: Not on file  Food Insecurity: Not on file  Transportation Needs: Not on file  Physical Activity: Not on file  Stress: Not on file  Social Connections: Not on file      Family History:  The patient's family history includes Brain cancer in his sister; Cancer in his brother and father; Colon cancer in his brother and father; Kidney cancer in his brother, sister, sister, sister, and sister; Stroke in his mother.   ROS:   Please see the history of present illness.    ROSAll other systems reviewed and are negative.   Physical Exam:    VS:  BP 130/72   Pulse 60   Ht '5\' 5"'  (1.651 m)   Wt 161 lb 6.4 oz (73.2 kg)   SpO2 95%   BMI 26.86 kg/m    GENERAL:  Well appearing WM in NAD HEENT:  PERRL, EOMI, sclera are clear. Oropharynx is clear. NECK:  No jugular venous distention, carotid upstroke brisk and symmetric, no bruits, no thyromegaly or adenopathy LUNGS:  Clear to auscultation bilaterally CHEST:  Unremarkable HEART:  RRR,  PMI not displaced or sustained,S1 and S2 within normal limits, no S3, no S4: no clicks, no rubs, no murmurs ABD:  Soft, nontender. BS +, no masses or bruits. No hepatomegaly, no splenomegaly EXT:  2 + pulses throughout, no edema, no cyanosis no clubbing SKIN:  Warm and dry.  No rashes NEURO:  Alert and oriented x 3. Cranial nerves II through XII intact. PSYCH:  Cognitively intact      Wt Readings from Last 3 Encounters:  05/08/20 161 lb 6.4 oz (73.2 kg)  04/21/20 164 lb (74.4 kg)  11/08/19 163 lb 9.6 oz (74.2 kg)      Studies/Labs Reviewed:     EKG:  EKG is not ordered today.    Recent Labs: 04/21/2020: ALT 29; BUN 13; Creatinine, Ser 1.01; Hemoglobin 14.9; Platelets 271.0; Potassium 4.0; Sodium 140   Recent Lipid Panel    Component Value Date/Time   CHOL 109 04/21/2020 0843   CHOL 97 (L) 05/10/2019 0850   TRIG 374.0 (H) 04/21/2020 0843   HDL 21.30 (L) 04/21/2020 0843   HDL 19 (L) 05/10/2019 0850   CHOLHDL 5 04/21/2020 0843   VLDL 74.8 (H) 04/21/2020 0843   LDLCALC 37 05/10/2019 0850   LDLDIRECT 44.0 04/21/2020 0843    Additional studies/ records that were reviewed today include:   Echo 07/13/18:  IMPRESSIONS    1. The left ventricle has normal systolic function with an ejection fraction of 60-65%. The cavity size was normal. Left ventricular diastolic Doppler parameters are consistent with impaired relaxation. No evidence of left ventricular regional wall  motion abnormalities.  2. The right ventricle has normal systolic function. The cavity was normal. There is no increase in right ventricular wall thickness.   Cardiac cath/PCI 07/13/18: CORONARY BALLOON ANGIOPLASTY  CORONARY STENT INTERVENTION  LEFT HEART CATH AND CORONARY ANGIOGRAPHY  Conclusion    Prox RCA to Mid RCA lesion is 90% stenosed.  Mid RCA lesion is 60% stenosed.  Previously placed Ost Cx to Mid Cx stent (unknown type) is widely patent.  Prox LAD lesion is 50% stenosed.  Ost 1st Diag to 1st Diag lesion is 50% stenosed.  Post intervention, there is a 25% residual stenosis.  A stent was successfully placed.  Post intervention, there is a 25% residual stenosis.  The left ventricular systolic function is normal.  LV end diastolic pressure is normal.  The left ventricular ejection fraction is 50-55% by visual estimate.   Terry Rivers is a 70 y.o. male    [id number] LOCATION:  FACILITY: [facility]  PHYSICIAN: Terry Rivers, M.D. 08-06-1950   DATE OF PROCEDURE:  07/13/2018  DATE OF DISCHARGE:     CARDIAC CATHETERIZATION / PCI DES RCA    History obtained from chart review.Terry P Hopperis a 70 y.o.malewith a hx of CADs/p PCIto RCA;staged PCI to P LCx3/2017, bradycardia, DM2(diet controlled)andDJDwho presented to [facility] with chest pain and SOB.   PROCEDURE DESCRIPTION:   The patient was brought to the second floor [facility] cath lab in the postabsorptive state. He was not premedicated . His right wrist was prepped and shaved in usual sterile fashion. Xylocaine 1% was used  for local anesthesia. A 6 French sheath was inserted into the right radial  artery using standard Seldinger technique. The patient received 3500 units  of heparin intravenously.  A 5 [place] TIG catheter and pigtail catheters were used for selective coronary angiography and left ventriculography respectively.  Isovue dye was used for the entirety of the case.  Retrograde aorta, ventricular and pullback pressures were recorded.  Radial cocktail was administered via the SideArm sheath.  The patient received an additional 6500 units of heparin followed by 2000 units of heparin with an ACT in the high 200 range.  The patient received 600 mg of p.o. Plavix in addition to 20 mg of IV Pepcid.  Using a 6 [place] AL 0.75 guide catheter along with a whisper wire I was able to cross the proximal band and lesion with some difficulty ultimately placing the wire in the distal PLA branch.  I then predilated the proximal lesion with a 2 oh balloon but was unable to pass a stent because of tortuosity.  I then placed a guide liner just proximal to the lesion and was able to pass a three 5 x 12 mm Synergy drug-eluting stent across the proximal in-stent restenosis deployed at 18 atm.  I then used a 04/20/2010 balloon to dilate the mid 50 to 60% lesion as well as post dilate the stent.  There was still mild "dog boning" within the Synergy stent in the proximal portion with residual 25% stenosis.  The guidewire and guide liner were removed and completion angiography was performed.  The guide catheter was removed from the body, the sheath was removed and a TR band was placed on the right wrist to achieve patent hemostasis.  The patient left lab in stable condition.   IMPRESSION: Successful balloon angioplasty of mid dominant RCA in-stent restenosis and restenting of the proximal RCA for high-grade in-stent restenosis with acceptable results.  The previously placed circumflex stent was widely open and the LAD had at most moderate disease unchanged from his prior angiogram 3 years ago.  His LV function was  normal with a low LVEDP.  He will need dual antiplatelet platelet therapy uninterrupted for at least 12 months.  Terry Rivers. MD, [facility] 07/13/2018 10:57 AM          ASSESSMENT:     1. Coronary artery disease involving native coronary artery of native heart without angina pectoris   2. Hyperlipidemia LDL goal <70   3. Essential hypertension     PLAN:     In order of problems listed above:  1. CAD - s/p inf STEMI in March 2017 tx with DES to RCA.  Staged PCI of the proximal to mid LCx with DES.  Recurrent symptoms in May 2020. S/p repeat PCI of the RCA with DES.  Continue ASA and Plavix indefinitely given multiple stents.  Continue  statin.   No beta blocker due to history of bradycardia. He is currently asymptomatic.    2. HTN - BP is now well controlled.   3. HL - Continue statin. LDL at goal. Triglycerides elevated. Encourage dietary modification and weight control. Will add Lovaza 2 grams bid.      Medication Adjustments/Labs and Tests Ordered: Current medicines are reviewed at length with the patient today.  Concerns regarding medicines are outlined above.  Medication changes, Labs and Tests ordered today are outlined in the Patient Instructions noted below. Patient Instructions  Add Lovaza (fish oil) 2 grams twice a day for triglycerides.  Continue your other therapy. Follow up in 6 months      Signed, Peter [name],  MD  05/08/2020 8:03 AM    [facility]

## 2020-05-08 ENCOUNTER — Ambulatory Visit (INDEPENDENT_AMBULATORY_CARE_PROVIDER_SITE_OTHER): Payer: Medicare Other | Admitting: Cardiology

## 2020-05-08 ENCOUNTER — Other Ambulatory Visit: Payer: Self-pay

## 2020-05-08 ENCOUNTER — Encounter: Payer: Self-pay | Admitting: Cardiology

## 2020-05-08 VITALS — BP 130/72 | HR 60 | Ht 65.0 in | Wt 161.4 lb

## 2020-05-08 DIAGNOSIS — I10 Essential (primary) hypertension: Secondary | ICD-10-CM

## 2020-05-08 DIAGNOSIS — E785 Hyperlipidemia, unspecified: Secondary | ICD-10-CM

## 2020-05-08 DIAGNOSIS — I2510 Atherosclerotic heart disease of native coronary artery without angina pectoris: Secondary | ICD-10-CM

## 2020-05-08 MED ORDER — OMEGA-3-ACID ETHYL ESTERS 1 G PO CAPS: 2.0000 g | ORAL_CAPSULE | Freq: Two times a day (BID) | ORAL | 11 refills | Status: DC

## 2020-05-08 NOTE — Patient Instructions (Signed)
Add Lovaza (fish oil) 2 grams twice a day for triglycerides.  Continue your other therapy. Follow up in 6 months

## 2020-05-15 ENCOUNTER — Other Ambulatory Visit: Payer: Self-pay | Admitting: Cardiology

## 2020-07-09 ENCOUNTER — Other Ambulatory Visit: Payer: Self-pay | Admitting: Cardiology

## 2020-07-16 ENCOUNTER — Telehealth: Payer: Self-pay | Admitting: Family Medicine

## 2020-07-16 NOTE — Telephone Encounter (Signed)
Left message for patient to call back and schedule Medicare Annual Wellness Visit (AWV) either virtually or in office.   Last AWV 01/28/16 please schedule at anytime with [facility] Nurse Health Advisor 1 or 2   This should be a 45 minute visit.

## 2020-07-31 ENCOUNTER — Telehealth: Payer: Self-pay | Admitting: Cardiology

## 2020-07-31 NOTE — Telephone Encounter (Signed)
Patient's wife called to schedule an appointment in September. I did not see anything until November, but she states the patient's medications need to be reviewed. She would like Cheryl to call her back. She states she always works the patient in.

## 2020-07-31 NOTE — Telephone Encounter (Signed)
Spoke to patient's wife appointment scheduled with Dr.Jordan 10/10 at 8:40 am.

## 2020-08-04 ENCOUNTER — Telehealth: Payer: Self-pay | Admitting: Cardiology

## 2020-08-04 NOTE — Telephone Encounter (Signed)
Spoke with patient's wife of Dr. [name]  Patient is having chest discomfort similar to what he had prior to previous cardiac events, verbatim per patient's wife  Explained that he should go to [facility] ED for an acute evaluation  Patient is refusing, only wanting to see Dr. [name] and especially does not want to go to ED knowing Dr. [name] is not there.   Explained that we cannot manage acute chest pain symptoms in the office setting  Patient still refuses to go to ED  Will send to [name] MD and [facility] LPN

## 2020-08-04 NOTE — Telephone Encounter (Signed)
Spoke to patient's wife she stated husband came in from [facility] today with chest pain.Stated he felt like he did when he a stent.No chest pain at present.Stated he was given appointment with Dr.Jordan in Oct. He only wants to see Dr.Jordan. Spoke to [place]  he will work him in his schedule tomorrow 6/21 at 4:40 pm.Advised if he has anymore chest pain he needs to go to ED.

## 2020-08-04 NOTE — Telephone Encounter (Signed)
Pt c/o of Chest Pain: STAT if CP now or developed within 24 hours  1. Are you having CP right now? discomfort  2. Are you experiencing any other symptoms (ex. SOB, nausea, vomiting, sweating)? no  3. How long have you been experiencing CP? Started a few minutes ago  4. Is your CP continuous or coming and going?  Coming and going  5. Have you taken Nitroglycerin? no ?

## 2020-08-05 ENCOUNTER — Other Ambulatory Visit: Payer: Self-pay | Admitting: Cardiology

## 2020-08-05 ENCOUNTER — Encounter: Payer: Self-pay | Admitting: Cardiology

## 2020-08-05 ENCOUNTER — Other Ambulatory Visit: Payer: Self-pay

## 2020-08-05 ENCOUNTER — Ambulatory Visit (INDEPENDENT_AMBULATORY_CARE_PROVIDER_SITE_OTHER): Payer: Medicare Other | Admitting: Cardiology

## 2020-08-05 VITALS — BP 154/80 | HR 47 | Ht 65.0 in | Wt 158.4 lb

## 2020-08-05 DIAGNOSIS — E7800 Pure hypercholesterolemia, unspecified: Secondary | ICD-10-CM

## 2020-08-05 DIAGNOSIS — I10 Essential (primary) hypertension: Secondary | ICD-10-CM

## 2020-08-05 DIAGNOSIS — I25110 Atherosclerotic heart disease of native coronary artery with unstable angina pectoris: Secondary | ICD-10-CM | POA: Diagnosis not present

## 2020-08-05 DIAGNOSIS — I200 Unstable angina: Secondary | ICD-10-CM

## 2020-08-05 MED ORDER — NITROGLYCERIN 0.4 MG SL SUBL
0.4000 mg | SUBLINGUAL_TABLET | SUBLINGUAL | 3 refills | Status: DC | PRN
Start: 2020-08-05 — End: 2020-11-24

## 2020-08-05 MED ORDER — SODIUM CHLORIDE 0.9% FLUSH: 3.0000 mL | Freq: Two times a day (BID) | INTRAVENOUS | Status: DC

## 2020-08-05 NOTE — H&P (View-Only) (Signed)
Cardiology Office Note:    Date:  08/05/2020   ID:  Terry Rivers, DOB 12/30/1950, MRN [id number]  PCP:  [name], MD  Cardiologist:  Dr. Kalijah Westfall [name]    Chief Complaint  Patient presents with   Coronary Artery Disease    History of Present Illness:     Terry Rivers is a 70 y.o. male with a hx of CAD, bradycardia, DM (diet controlled), DJD who is seen as a work in for evaluation of chest pain.   Admitted 2/28-04/17/15 with inferior STEMI.  LHC demonstrated multivessel CAD with occluded RCA as the culprit. RCA was treated with DES.  He has residual LAD/Dx and LCx disease. He was not started on beta-blocker due to bradycardia. On 05/06/15 he underwent staged PCI of the proximal to mid LCx with a DES. The mid LAD had a 60% stenosis with FFR of 0.9 and was  treated medically.  Has done very well until March 2020 when he began experiencing intermittent exertional chest pain and dyspnea.  He was admitted in May and had repeat cardiac cath showing restenosis in the RCA treated with DES. Stents in the  LCx still patent.  LAD disease unchanged. For BP control his lisinopril dose was increased and amlodipine added.   He has done well until yesterday. He states he did some mowing in the am. About 1 pm he developed pain in his left chest with tightness in the precordial region. This waxed and waned until about 7 pm. He did not take sl Ntg. He has no recurrent pain since 7 pm. Denied any SOB or diaphoresis. No nausea. States this is the same pain he had with his prior interventions.    Past Medical History:  Diagnosis Date   Arthritis    Benign essential HTN    Bradycardia    low 50"s per wife   CAD, multiple vessel-native arteries, emergent PCI RCA with DES 04/15/15 2017   a. 03/2014- inferior STEMI with occluded RCA; b. 04/2015: Staged PCI of LCx c. 07/13/18 PCI to [facility] for ISR   Chicken pox    Diabetes mellitus without complication ([place])    Borderline/no meds   Frequent headaches     GERD (gastroesophageal reflux disease)    Hyperlipidemia LDL goal <70 04/17/2015   Myocardial infarction ([place]) 03/2015   Tobacco use 04/17/2015   Unstable angina ([place]) 06/2018    Past Surgical History:  Procedure Laterality Date   CARDIAC CATHETERIZATION N/A 04/15/2015   Procedure: Left Heart Cath and Coronary Angiography;  Surgeon: Kalisi Bevill M [name], MD;  Location: [place] [facility];  Service: Cardiovascular;  Laterality: N/A;   CARDIAC CATHETERIZATION N/A 04/15/2015   Procedure: Coronary Stent Intervention;  Surgeon: Rajohn Henery M [name], MD;  Location: [place] [facility];  Service: Cardiovascular;  Laterality: N/A;   CARDIAC CATHETERIZATION N/A 05/06/2015   Procedure: Coronary Stent Intervention;  Surgeon: Shadi Sessler M [name], MD;  Location: [place] [facility];  Service: Cardiovascular;  Laterality: N/A;   CARDIAC CATHETERIZATION  05/06/2015   Procedure: Coronary/Graft Angiography;  Surgeon: Keila Turan M [name], MD;  Location: [place] [facility];  Service: Cardiovascular;;   COLONOSCOPY     CORONARY BALLOON ANGIOPLASTY N/A 07/13/2018   Procedure: CORONARY BALLOON ANGIOPLASTY;  Surgeon: [name], MD;  Location: [place] [facility];  Service: Cardiovascular;  Laterality: N/A;   CORONARY STENT INTERVENTION N/A 07/13/2018   Procedure: CORONARY STENT INTERVENTION;  Surgeon: [name], MD;  Location: [place] [facility];  Service: Cardiovascular;  Laterality: N/A;   CORONARY STENT PLACEMENT  05/06/2015   LT CIRC DES X2   LEFT HEART CATH AND CORONARY ANGIOGRAPHY N/A 07/13/2018   Procedure: LEFT HEART CATH AND CORONARY ANGIOGRAPHY;  Surgeon: [name], MD;  Location: [place] [facility];  Service: Cardiovascular;  Laterality: N/A;   POLYPECTOMY     SKIN CANCER EXCISION     right forearm/pre-cancerous/ 2 spots on left arm    Current Medications: Outpatient Medications Prior to Visit  Medication Sig Dispense Refill   acetaminophen (TYLENOL) 500 MG tablet Take 500-1,000 mg by mouth every 6 (six)  hours as needed for mild pain or headache.     amLODipine (NORVASC) 5 MG tablet TAKE 1 TABLET BY MOUTH DAILY 90 tablet 3   aspirin 81 MG chewable tablet Chew 1 tablet (81 mg total) by mouth daily.     atorvastatin (LIPITOR) 80 MG tablet TAKE 1 TABLET (80 MG TOTAL) BY MOUTH DAILY AT 6 PM. 90 tablet 2   clopidogrel (PLAVIX) 75 MG tablet TAKE 1 TABLET (75 MG TOTAL) BY MOUTH DAILY WITH BREAKFAST. 90 tablet 3   lisinopril (ZESTRIL) 40 MG tablet Take 1 tablet (40 mg total) by mouth daily. 90 tablet 3   omega-3 acid ethyl esters (LOVAZA) 1 g capsule Take 2 capsules (2 g total) by mouth 2 (two) times daily. 120 capsule 11   pantoprazole (PROTONIX) 40 MG tablet TAKE 1 TABLET BY MOUTH DAILY BEFORE BREAKFAST 90 tablet 3   nitroGLYCERIN (NITROSTAT) 0.4 MG SL tablet Place 1 tablet (0.4 mg total) under the tongue every 5 (five) minutes x 3 doses as needed for chest pain. 25 tablet 3   lisinopril (ZESTRIL) 20 MG tablet TAKE 1 TABLET BY MOUTH DAILY 90 tablet 3   No facility-administered medications prior to visit.     Allergies:   Penicillins and Prednisone   Social History   Socioeconomic History   Marital status: Married    Spouse name: Not on file   Number of children: Not on file   Years of education: Not on file   Highest education level: Not on file  Occupational History   Not on file  Tobacco Use   Smoking status: Never   Smokeless tobacco: Current    Types: Chew   Tobacco comments:    chew every day  Vaping Use   Vaping Use: Never used  Substance and Sexual Activity   Alcohol use: No    Alcohol/week: 0.0 standard drinks   Drug use: No   Sexual activity: Not on file  Other Topics Concern   Not on file  Social History Narrative   Not on file   Social Determinants of Health   Financial Resource Strain: Not on file  Food Insecurity: Not on file  Transportation Needs: Not on file  Physical Activity: Not on file  Stress: Not on file  Social Connections: Not on file     Family  History:  The patient's family history includes Brain cancer in his sister; Cancer in his brother and father; Colon cancer in his brother and father; Kidney cancer in his brother, sister, sister, sister, and sister; Stroke in his mother.   ROS:   Please see the history of present illness.    ROSAll other systems reviewed and are negative.   Physical Exam:    VS:  BP (!) 154/80 (BP Location: Right Arm, Patient Position: Sitting, Cuff Size: Normal)   Pulse (!) 47   Ht 5'  5" (1.651 m)   Wt 158 lb 6.4 oz (71.8 kg)   SpO2 95%   BMI 26.36 kg/m    GENERAL:  Well appearing WM in NAD HEENT:  PERRL, EOMI, sclera are clear. Oropharynx is clear. NECK:  No jugular venous distention, carotid upstroke brisk and symmetric, no bruits, no thyromegaly or adenopathy LUNGS:  Clear to auscultation bilaterally CHEST:  Unremarkable HEART:  RRR,  PMI not displaced or sustained,S1 and S2 within normal limits, no S3, no S4: no clicks, no rubs, no murmurs ABD:  Soft, nontender. BS +, no masses or bruits. No hepatomegaly, no splenomegaly EXT:  2 + pulses throughout, no edema, no cyanosis no clubbing SKIN:  Warm and dry.  No rashes NEURO:  Alert and oriented x 3. Cranial nerves II through XII intact. PSYCH:  Cognitively intact      Wt Readings from Last 3 Encounters:  08/05/20 158 lb 6.4 oz (71.8 kg)  05/08/20 161 lb 6.4 oz (73.2 kg)  04/21/20 164 lb (74.4 kg)      Studies/Labs Reviewed:     EKG:  EKG is  ordered today. Sinus brady rate 47. Mild nonspecific ST abnormality. No acute change. I have personally reviewed and interpreted this study.    Recent Labs: 04/21/2020: ALT 29; BUN 13; Creatinine, Ser 1.01; Hemoglobin 14.9; Platelets 271.0; Potassium 4.0; Sodium 140   Recent Lipid Panel    Component Value Date/Time   CHOL 109 04/21/2020 0843   CHOL 97 (L) 05/10/2019 0850   TRIG 374.0 (H) 04/21/2020 0843   HDL 21.30 (L) 04/21/2020 0843   HDL 19 (L) 05/10/2019 0850   CHOLHDL 5 04/21/2020 0843    VLDL 74.8 (H) 04/21/2020 0843   LDLCALC 37 05/10/2019 0850   LDLDIRECT 44.0 04/21/2020 0843    Additional studies/ records that were reviewed today include:   Echo 07/13/18: IMPRESSIONS      1. The left ventricle has normal systolic function with an ejection fraction of 60-65%. The cavity size was normal. Left ventricular diastolic Doppler parameters are consistent with impaired relaxation. No evidence of left ventricular regional wall  motion abnormalities.  2. The right ventricle has normal systolic function. The cavity was normal. There is no increase in right ventricular wall thickness.    Cardiac cath/PCI 07/13/18: CORONARY BALLOON ANGIOPLASTY  CORONARY STENT INTERVENTION  LEFT HEART CATH AND CORONARY ANGIOGRAPHY  Conclusion    Prox RCA to Mid RCA lesion is 90% stenosed. Mid RCA lesion is 60% stenosed. Previously placed Ost Cx to Mid Cx stent (unknown type) is widely patent. Prox LAD lesion is 50% stenosed. Ost 1st Diag to 1st Diag lesion is 50% stenosed. Post intervention, there is a 25% residual stenosis. A stent was successfully placed. Post intervention, there is a 25% residual stenosis. The left ventricular systolic function is normal. LV end diastolic pressure is normal. The left ventricular ejection fraction is 50-55% by visual estimate.   Terry Rivers is a 70 y.o. male      [id number] LOCATION:  FACILITY: [facility]  PHYSICIAN: [name], M.D. [birth year]     DATE OF PROCEDURE:  07/13/2018   DATE OF DISCHARGE:        CARDIAC CATHETERIZATION / PCI DES RCA       History obtained from chart review.Terry Rivers is a 70 y.o. male with a hx of CAD s/p PCI to RCA; staged PCI to P LCx 04/2015, bradycardia, DM2 (diet controlled) and DJD who presented [facility] on 07/12[facility]2018 with chest pain  and SOB.     PROCEDURE DESCRIPTION:    The patient was brought to the second floor [facility] cath lab in the postabsorptive state. He was not premedicated . His  right wrist was prepped and shaved in usual sterile fashion. Xylocaine 1% was used  for local anesthesia. A 6 French sheath was inserted into the right radial artery using standard Seldinger technique. The patient received 3500 units  of heparin intravenously.  A 5 [place] TIG catheter and pigtail catheters were used for selective coronary angiography and left ventriculography respectively.  Isovue dye was used for the entirety of the case.  Retrograde aorta, ventricular and pullback pressures were recorded.  Radial cocktail was administered via the SideArm sheath.   The patient received an additional 6500 units of heparin followed by 2000 units of heparin with an ACT in the high 200 range.  The patient received 600 mg of p.o. Plavix in addition to 20 mg of IV Pepcid.  Using a 6 [place] AL 0.75 guide catheter along with a whisper wire I was able to cross the proximal band and lesion with some difficulty ultimately placing the wire in the distal PLA branch.  I then predilated the proximal lesion with a 2 oh balloon but was unable to pass a stent because of tortuosity.  I then placed a guide liner just proximal to the lesion and was able to pass a three 5 x 12 mm Synergy drug-eluting stent across the proximal in-stent restenosis deployed at 18 atm.  I then used a 04/20/2010 balloon to dilate the mid 50 to 60% lesion as well as post dilate the stent.  There was still mild "dog boning" within the Synergy stent in the proximal portion with residual 25% stenosis.  The guidewire and guide liner were removed and completion angiography was performed.  The guide catheter was removed from the body, the sheath was removed and a TR band was placed on the right wrist to achieve patent hemostasis.  The patient left lab in stable condition.     IMPRESSION: Successful balloon angioplasty of mid dominant RCA in-stent restenosis and restenting of the proximal RCA for high-grade in-stent restenosis with acceptable results.  The  previously placed circumflex stent was widely open and the LAD had at most moderate disease unchanged from his prior angiogram 3 years ago.  His LV function was normal with a low LVEDP.  He will need dual antiplatelet platelet therapy uninterrupted for at least 12 months.   [name]. MD, [facility] 07/13/2018 10:57 AM           ASSESSMENT:     1. Coronary artery disease involving native coronary artery of native heart with unstable angina pectoris ([place])   2. Hypercholesterolemia   3. Essential hypertension     PLAN:     In order of problems listed above:  1. CAD - s/p inf STEMI in March 2017 tx with DES to RCA.  Staged PCI of the proximal to mid LCx with DES.  Recurrent symptoms in May 2020. S/p repeat PCI of the RCA with DES.  Continue ASA and Plavix indefinitely given multiple stents.  Continue  statin.   No beta blocker due to history of bradycardia.  He now has symptoms concerning for unstable angina. We discussed the appropriate use of sl Ntg for chest pain. We discussed further ischemic evaluation with stress testing versus cardiac cath. Given nature of symptoms and prior history I would favor cardiac cath. He is in agreement.  I offered to schedule this in the next few days but he wants to wait until July 1. If he has recurrent chest pain his wife is going to call 911. With arrange LHC with possible PCI on July 1. The procedure and risks were reviewed including but not limited to death, myocardial infarction, stroke, arrythmias, bleeding, transfusion, emergency surgery, dye allergy, or renal dysfunction. The patient voices understanding and is agreeable to proceed..   2. HTN - BP is elevated. Will monitor.   3. HL - Continue statin. LDL at goal- 44. Triglycerides elevated. Encourage dietary modification and weight control. Will add Lovaza 2 grams bid.      Medication Adjustments/Labs and Tests Ordered: Current medicines are reviewed at length with the patient today.  Concerns  regarding medicines are outlined above.  Medication changes, Labs and Tests ordered today are outlined in the Patient Instructions noted below. There are no Patient Instructions on file for this visit.     Signed, Kazandra Forstrom [name], MD  08/05/2020 5:14 PM     [facility]

## 2020-08-05 NOTE — Progress Notes (Signed)
Cardiology Office Note:    Date:  08/05/2020   ID:  Terry Rivers, DOB 03-15-1950, MRN [id number]  PCP:  [name], MD  Cardiologist:  Dr. Ellianah Cordy [name]    Chief Complaint  Patient presents with   Coronary Artery Disease    History of Present Illness:     Terry Rivers is a 70 y.o. male with a hx of CAD, bradycardia, DM (diet controlled), DJD who is seen as a work in for evaluation of chest pain.   Admitted 2/28-04/17/15 with inferior STEMI.  LHC demonstrated multivessel CAD with occluded RCA as the culprit. RCA was treated with DES.  He has residual LAD/Dx and LCx disease. He was not started on beta-blocker due to bradycardia. On 05/06/15 he underwent staged PCI of the proximal to mid LCx with a DES. The mid LAD had a 60% stenosis with FFR of 0.9 and was  treated medically.  Has done very well until March 2020 when he began experiencing intermittent exertional chest pain and dyspnea.  He was admitted in May and had repeat cardiac cath showing restenosis in the RCA treated with DES. Stents in the  LCx still patent.  LAD disease unchanged. For BP control his lisinopril dose was increased and amlodipine added.   He has done well until yesterday. He states he did some mowing in the am. About 1 pm he developed pain in his left chest with tightness in the precordial region. This waxed and waned until about 7 pm. He did not take sl Ntg. He has no recurrent pain since 7 pm. Denied any SOB or diaphoresis. No nausea. States this is the same pain he had with his prior interventions.    Past Medical History:  Diagnosis Date   Arthritis    Benign essential HTN    Bradycardia    low 50"s per wife   CAD, multiple vessel-native arteries, emergent PCI RCA with DES 04/15/15 2017   a. 03/2014- inferior STEMI with occluded RCA; b. 04/2015: Staged PCI of LCx c. 07/13/18 PCI to [facility] for ISR   Chicken pox    Diabetes mellitus without complication ([place])    Borderline/no meds   Frequent headaches     GERD (gastroesophageal reflux disease)    Hyperlipidemia LDL goal <70 04/17/2015   Myocardial infarction ([place]) 03/2015   Tobacco use 04/17/2015   Unstable angina ([place]) 06/2018    Past Surgical History:  Procedure Laterality Date   CARDIAC CATHETERIZATION N/A 04/15/2015   Procedure: Left Heart Cath and Coronary Angiography;  Surgeon: Gerilyn Stargell M [name], MD;  Location: [place] [facility];  Service: Cardiovascular;  Laterality: N/A;   CARDIAC CATHETERIZATION N/A 04/15/2015   Procedure: Coronary Stent Intervention;  Surgeon: Destiny Trickey M [name], MD;  Location: [place] [facility];  Service: Cardiovascular;  Laterality: N/A;   CARDIAC CATHETERIZATION N/A 05/06/2015   Procedure: Coronary Stent Intervention;  Surgeon: Airi Copado M [name], MD;  Location: [place] [facility];  Service: Cardiovascular;  Laterality: N/A;   CARDIAC CATHETERIZATION  05/06/2015   Procedure: Coronary/Graft Angiography;  Surgeon: Ciani Rutten M [name], MD;  Location: [place] [facility];  Service: Cardiovascular;;   COLONOSCOPY     CORONARY BALLOON ANGIOPLASTY N/A 07/13/2018   Procedure: CORONARY BALLOON ANGIOPLASTY;  Surgeon: [name], MD;  Location: [place] [facility];  Service: Cardiovascular;  Laterality: N/A;   CORONARY STENT INTERVENTION N/A 07/13/2018   Procedure: CORONARY STENT INTERVENTION;  Surgeon: [name], MD;  Location: [place] [facility];  Service: Cardiovascular;  Laterality: N/A;   CORONARY STENT PLACEMENT  05/06/2015   LT CIRC DES X2   LEFT HEART CATH AND CORONARY ANGIOGRAPHY N/A 07/13/2018   Procedure: LEFT HEART CATH AND CORONARY ANGIOGRAPHY;  Surgeon: [name], MD;  Location: [place] [facility];  Service: Cardiovascular;  Laterality: N/A;   POLYPECTOMY     SKIN CANCER EXCISION     right forearm/pre-cancerous/ 2 spots on left arm    Current Medications: Outpatient Medications Prior to Visit  Medication Sig Dispense Refill   acetaminophen (TYLENOL) 500 MG tablet Take 500-1,000 mg by mouth every 6 (six)  hours as needed for mild pain or headache.     amLODipine (NORVASC) 5 MG tablet TAKE 1 TABLET BY MOUTH DAILY 90 tablet 3   aspirin 81 MG chewable tablet Chew 1 tablet (81 mg total) by mouth daily.     atorvastatin (LIPITOR) 80 MG tablet TAKE 1 TABLET (80 MG TOTAL) BY MOUTH DAILY AT 6 PM. 90 tablet 2   clopidogrel (PLAVIX) 75 MG tablet TAKE 1 TABLET (75 MG TOTAL) BY MOUTH DAILY WITH BREAKFAST. 90 tablet 3   lisinopril (ZESTRIL) 40 MG tablet Take 1 tablet (40 mg total) by mouth daily. 90 tablet 3   omega-3 acid ethyl esters (LOVAZA) 1 g capsule Take 2 capsules (2 g total) by mouth 2 (two) times daily. 120 capsule 11   pantoprazole (PROTONIX) 40 MG tablet TAKE 1 TABLET BY MOUTH DAILY BEFORE BREAKFAST 90 tablet 3   nitroGLYCERIN (NITROSTAT) 0.4 MG SL tablet Place 1 tablet (0.4 mg total) under the tongue every 5 (five) minutes x 3 doses as needed for chest pain. 25 tablet 3   lisinopril (ZESTRIL) 20 MG tablet TAKE 1 TABLET BY MOUTH DAILY 90 tablet 3   No facility-administered medications prior to visit.     Allergies:   Penicillins and Prednisone   Social History   Socioeconomic History   Marital status: Married    Spouse name: Not on file   Number of children: Not on file   Years of education: Not on file   Highest education level: Not on file  Occupational History   Not on file  Tobacco Use   Smoking status: Never   Smokeless tobacco: Current    Types: Chew   Tobacco comments:    chew every day  Vaping Use   Vaping Use: Never used  Substance and Sexual Activity   Alcohol use: No    Alcohol/week: 0.0 standard drinks   Drug use: No   Sexual activity: Not on file  Other Topics Concern   Not on file  Social History Narrative   Not on file   Social Determinants of Health   Financial Resource Strain: Not on file  Food Insecurity: Not on file  Transportation Needs: Not on file  Physical Activity: Not on file  Stress: Not on file  Social Connections: Not on file     Family  History:  The patient's family history includes Brain cancer in his sister; Cancer in his brother and father; Colon cancer in his brother and father; Kidney cancer in his brother, sister, sister, sister, and sister; Stroke in his mother.   ROS:   Please see the history of present illness.    ROSAll other systems reviewed and are negative.   Physical Exam:    VS:  BP (!) 154/80 (BP Location: Right Arm, Patient Position: Sitting, Cuff Size: Normal)   Pulse (!) 47   Ht 5'  5" (1.651 m)   Wt 158 lb 6.4 oz (71.8 kg)   SpO2 95%   BMI 26.36 kg/m    GENERAL:  Well appearing WM in NAD HEENT:  PERRL, EOMI, sclera are clear. Oropharynx is clear. NECK:  No jugular venous distention, carotid upstroke brisk and symmetric, no bruits, no thyromegaly or adenopathy LUNGS:  Clear to auscultation bilaterally CHEST:  Unremarkable HEART:  RRR,  PMI not displaced or sustained,S1 and S2 within normal limits, no S3, no S4: no clicks, no rubs, no murmurs ABD:  Soft, nontender. BS +, no masses or bruits. No hepatomegaly, no splenomegaly EXT:  2 + pulses throughout, no edema, no cyanosis no clubbing SKIN:  Warm and dry.  No rashes NEURO:  Alert and oriented x 3. Cranial nerves II through XII intact. PSYCH:  Cognitively intact      Wt Readings from Last 3 Encounters:  08/05/20 158 lb 6.4 oz (71.8 kg)  05/08/20 161 lb 6.4 oz (73.2 kg)  04/21/20 164 lb (74.4 kg)      Studies/Labs Reviewed:     EKG:  EKG is  ordered today. Sinus brady rate 47. Mild nonspecific ST abnormality. No acute change. I have personally reviewed and interpreted this study.    Recent Labs: 04/21/2020: ALT 29; BUN 13; Creatinine, Ser 1.01; Hemoglobin 14.9; Platelets 271.0; Potassium 4.0; Sodium 140   Recent Lipid Panel    Component Value Date/Time   CHOL 109 04/21/2020 0843   CHOL 97 (L) 05/10/2019 0850   TRIG 374.0 (H) 04/21/2020 0843   HDL 21.30 (L) 04/21/2020 0843   HDL 19 (L) 05/10/2019 0850   CHOLHDL 5 04/21/2020 0843    VLDL 74.8 (H) 04/21/2020 0843   LDLCALC 37 05/10/2019 0850   LDLDIRECT 44.0 04/21/2020 0843    Additional studies/ records that were reviewed today include:   Echo 07/13/18: IMPRESSIONS      1. The left ventricle has normal systolic function with an ejection fraction of 60-65%. The cavity size was normal. Left ventricular diastolic Doppler parameters are consistent with impaired relaxation. No evidence of left ventricular regional wall  motion abnormalities.  2. The right ventricle has normal systolic function. The cavity was normal. There is no increase in right ventricular wall thickness.    Cardiac cath/PCI 07/13/18: CORONARY BALLOON ANGIOPLASTY  CORONARY STENT INTERVENTION  LEFT HEART CATH AND CORONARY ANGIOGRAPHY  Conclusion    Prox RCA to Mid RCA lesion is 90% stenosed. Mid RCA lesion is 60% stenosed. Previously placed Ost Cx to Mid Cx stent (unknown type) is widely patent. Prox LAD lesion is 50% stenosed. Ost 1st Diag to 1st Diag lesion is 50% stenosed. Post intervention, there is a 25% residual stenosis. A stent was successfully placed. Post intervention, there is a 25% residual stenosis. The left ventricular systolic function is normal. LV end diastolic pressure is normal. The left ventricular ejection fraction is 50-55% by visual estimate.   WATARU MCCOWEN is a 70 y.o. male      [id number] LOCATION:  FACILITY: [facility]  PHYSICIAN: [name], M.D. May 12, 1950     DATE OF PROCEDURE:  07/13/2018   DATE OF DISCHARGE:        CARDIAC CATHETERIZATION / PCI DES RCA       History obtained from chart review.CARDELL RACHEL is a 70 y.o. male with a hx of CAD s/p PCI to RCA; staged PCI to P LCx 04/2015, bradycardia, DM2 (diet controlled) and DJD who presented to [facility] on 07/12/2018 with chest pain  and SOB.     PROCEDURE DESCRIPTION:    The patient was brought to the second floor [place] [facility]iac cath lab in the postabsorptive state. He was not premedicated . His  right wrist was prepped and shaved in usual sterile fashion. Xylocaine 1% was used  for local anesthesia. A 6 French sheath was inserted into the right radial artery using standard Seldinger technique. The patient received 3500 units  of heparin intravenously.  A 5 [place] TIG catheter and pigtail catheters were used for selective coronary angiography and left ventriculography respectively.  Isovue dye was used for the entirety of the case.  Retrograde aorta, ventricular and pullback pressures were recorded.  Radial cocktail was administered via the SideArm sheath.   The patient received an additional 6500 units of heparin followed by 2000 units of heparin with an ACT in the high 200 range.  The patient received 600 mg of p.o. Plavix in addition to 20 mg of IV Pepcid.  Using a 6 [place] AL 0.75 guide catheter along with a whisper wire I was able to cross the proximal band and lesion with some difficulty ultimately placing the wire in the distal PLA branch.  I then predilated the proximal lesion with a 2 oh balloon but was unable to pass a stent because of tortuosity.  I then placed a guide liner just proximal to the lesion and was able to pass a three 5 x 12 mm Synergy drug-eluting stent across the proximal in-stent restenosis deployed at 18 atm.  I then used a 04/20/2010 balloon to dilate the mid 50 to 60% lesion as well as post dilate the stent.  There was still mild "dog boning" within the Synergy stent in the proximal portion with residual 25% stenosis.  The guidewire and guide liner were removed and completion angiography was performed.  The guide catheter was removed from the body, the sheath was removed and a TR band was placed on the right wrist to achieve patent hemostasis.  The patient left lab in stable condition.     IMPRESSION: Successful balloon angioplasty of mid dominant RCA in-stent restenosis and restenting of the proximal RCA for high-grade in-stent restenosis with acceptable results.  The  previously placed circumflex stent was widely open and the LAD had at most moderate disease unchanged from his prior angiogram 3 years ago.  His LV function was normal with a low LVEDP.  He will need dual antiplatelet platelet therapy uninterrupted for at least 12 months.   [name]. MD, [facility] 07/13/2018 10:57 AM           ASSESSMENT:     1. Coronary artery disease involving native coronary artery of native heart with unstable angina pectoris ([place])   2. Hypercholesterolemia   3. Essential hypertension     PLAN:     In order of problems listed above:  1. CAD - s/p inf STEMI in March 2017 tx with DES to RCA.  Staged PCI of the proximal to mid LCx with DES.  Recurrent symptoms in May 2020. S/p repeat PCI of the RCA with DES.  Continue ASA and Plavix indefinitely given multiple stents.  Continue  statin.   No beta blocker due to history of bradycardia.  He now has symptoms concerning for unstable angina. We discussed the appropriate use of sl Ntg for chest pain. We discussed further ischemic evaluation with stress testing versus cardiac cath. Given nature of symptoms and prior history I would favor cardiac cath. He is in agreement.  I offered to schedule this in the next few days but he wants to wait until July 1. If he has recurrent chest pain his wife is going to call 911. With arrange LHC with possible PCI on July 1. The procedure and risks were reviewed including but not limited to death, myocardial infarction, stroke, arrythmias, bleeding, transfusion, emergency surgery, dye allergy, or renal dysfunction. The patient voices understanding and is agreeable to proceed..   2. HTN - BP is elevated. Will monitor.   3. HL - Continue statin. LDL at goal- 44. Triglycerides elevated. Encourage dietary modification and weight control. Will add Lovaza 2 grams bid.      Medication Adjustments/Labs and Tests Ordered: Current medicines are reviewed at length with the patient today.  Concerns  regarding medicines are outlined above.  Medication changes, Labs and Tests ordered today are outlined in the Patient Instructions noted below. There are no Patient Instructions on file for this visit.     Signed, Burdell Peed [name], MD  08/05/2020 5:14 PM    [facility]

## 2020-08-06 NOTE — Addendum Note (Signed)
Addended by: Kathyrn Lass on: 08/06/2020 05:18 PM   Modules accepted: Orders

## 2020-08-06 NOTE — Patient Instructions (Signed)
Medication Instructions:   Continue same medications  *If you need a refill on your cardiac medications before your next appointment, please call your pharmacy*   Lab Work:  Bmet,cbc,pt to be done 08/08/20 at [place] office   Testing/Procedures:  Cardiac Cath   Follow instructions below   Follow-Up: At [name], you and your health needs are our priority.  As part of our continuing mission to provide you with exceptional heart care, we have created designated Provider Care Teams.  These Care Teams include your primary Cardiologist (physician) and Advanced Practice Providers (APPs -  Physician Assistants and Nurse Practitioners) who all work together to provide you with the care you need, when you need it.  We recommend signing up for the patient portal called "MyChart".  Sign up information is provided on this After Visit Summary.  MyChart is used to connect with patients for Virtual Visits (Telemedicine).  Patients are able to view lab/test results, encounter notes, upcoming appointments, etc.  Non-urgent messages can be sent to your provider as well.   To learn more about what you can do with MyChart, go to [email or web address].    Your next appointment:  Laurann [place] PA  Friday 08/29/20  [place] at [place] [address] Level [address]    The format for your next appointment: Office   Provider:  Blende Billingsley [place] [address] Dept: [phone] Loc: [place]  08/06/2020  You are scheduled for a Cardiac Cath on Friday 08/15/20 with Dr.Jordan.  1. Please arrive at the [facility] (Main Entrance A) at [facility]: [address] [place], [place] [address] at 5:30 am (This time is two hours before your procedure to ensure your preparation). Free valet parking service is available.   Special note:  Every effort is made to have your procedure done on time. Please understand that emergencies sometimes delay scheduled procedures.  2. Diet: Do not eat solid foods after midnight.  The patient may have clear liquids until 5am upon the day of the procedure.  3. Labs: You will need to have blood drawn on Friday 08/08/20 at [place] office.  4. Medication instructions in preparation for your procedure:      On the morning of your procedure, take your Aspirin and Plavix and any morning medicines NOT listed above.  You may use sips of water.  5. Plan for one night stay--bring personal belongings. 6. Bring a current list of your medications and current insurance cards. 7. You MUST have a responsible person to drive you home. 8. Someone MUST be with you the first 24 hours after you arrive home or your discharge will be delayed. 9. Please wear clothes that are easy to get on and off and wear slip-on shoes.  Thank you for allowing [place] to care for you!   -- [facility]

## 2020-08-08 DIAGNOSIS — I25110 Atherosclerotic heart disease of native coronary artery with unstable angina pectoris: Secondary | ICD-10-CM | POA: Diagnosis not present

## 2020-08-08 DIAGNOSIS — E7800 Pure hypercholesterolemia, unspecified: Secondary | ICD-10-CM | POA: Diagnosis not present

## 2020-08-08 DIAGNOSIS — I10 Essential (primary) hypertension: Secondary | ICD-10-CM | POA: Diagnosis not present

## 2020-08-09 LAB — CBC WITH DIFFERENTIAL/PLATELET
Basophils Absolute: 0 10*3/uL (ref 0.0–0.2)
Basos: 1 %
EOS (ABSOLUTE): 0.1 10*3/uL (ref 0.0–0.4)
Eos: 2 %
Hematocrit: 44.5 % (ref 37.5–51.0)
Hemoglobin: 15.2 g/dL (ref 13.0–17.7)
Immature Grans (Abs): 0 10*3/uL (ref 0.0–0.1)
Immature Granulocytes: 0 %
Lymphocytes Absolute: 2.2 10*3/uL (ref 0.7–3.1)
Lymphs: 32 %
MCH: 31.3 pg (ref 26.6–33.0)
MCHC: 34.2 g/dL (ref 31.5–35.7)
MCV: 92 fL (ref 79–97)
Monocytes Absolute: 0.8 10*3/uL (ref 0.1–0.9)
Monocytes: 11 %
Neutrophils Absolute: 3.7 10*3/uL (ref 1.4–7.0)
Neutrophils: 54 %
Platelets: 244 10*3/uL (ref 150–450)
RBC: 4.86 x10E6/uL (ref 4.14–5.80)
RDW: 13.5 % (ref 11.6–15.4)
WBC: 6.8 10*3/uL (ref 3.4–10.8)

## 2020-08-09 LAB — PT AND PTT
INR: 1.1 (ref 0.9–1.2)
Prothrombin Time: 11.3 s (ref 9.1–12.0)
aPTT: 26 s (ref 24–33)

## 2020-08-09 LAB — BASIC METABOLIC PANEL
BUN/Creatinine Ratio: 11 (ref 10–24)
BUN: 11 mg/dL (ref 8–27)
CO2: 20 mmol/L (ref 20–29)
Calcium: 9.3 mg/dL (ref 8.6–10.2)
Chloride: 104 mmol/L (ref 96–106)
Creatinine, Ser: 0.99 mg/dL (ref 0.76–1.27)
Glucose: 103 mg/dL — ABNORMAL HIGH (ref 65–99)
Potassium: 4.3 mmol/L (ref 3.5–5.2)
Sodium: 140 mmol/L (ref 134–144)
eGFR: 81 mL/min/{1.73_m2} (ref >59)

## 2020-08-11 ENCOUNTER — Other Ambulatory Visit: Payer: Self-pay | Admitting: Cardiology

## 2020-08-14 ENCOUNTER — Telehealth: Payer: Self-pay | Admitting: *Deleted

## 2020-08-14 NOTE — Telephone Encounter (Addendum)
Call placed to patient to review procedure instructions. Patient's wife (DPR) states recently sat down with Dr Doug Sou nurse and reviewed instructions. Wife states she does not have any questions about instructions, reminded her to have pt  take aspirin and plavix in the morning prior to leaving home.

## 2020-08-15 ENCOUNTER — Encounter (HOSPITAL_COMMUNITY): Payer: Self-pay | Admitting: Cardiology

## 2020-08-15 ENCOUNTER — Encounter (HOSPITAL_COMMUNITY)
Admission: RE | Disposition: A | Discharge status: Home / Self Care | Payer: Self-pay | Source: Home / Self Care | Attending: Cardiology

## 2020-08-15 ENCOUNTER — Ambulatory Visit (HOSPITAL_COMMUNITY)
Admission: RE | Admit: 2020-08-15 | Discharge: 2020-08-15 | Disposition: A | Discharge status: Home / Self Care | Payer: Medicare Other | Attending: Cardiology | Admitting: Cardiology

## 2020-08-15 ENCOUNTER — Other Ambulatory Visit: Payer: Self-pay

## 2020-08-15 DIAGNOSIS — E7800 Pure hypercholesterolemia, unspecified: Secondary | ICD-10-CM | POA: Diagnosis not present

## 2020-08-15 DIAGNOSIS — M1990 Unspecified osteoarthritis, unspecified site: Secondary | ICD-10-CM | POA: Insufficient documentation

## 2020-08-15 DIAGNOSIS — F17290 Nicotine dependence, other tobacco product, uncomplicated: Secondary | ICD-10-CM | POA: Insufficient documentation

## 2020-08-15 DIAGNOSIS — R001 Bradycardia, unspecified: Secondary | ICD-10-CM | POA: Diagnosis not present

## 2020-08-15 DIAGNOSIS — I25110 Atherosclerotic heart disease of native coronary artery with unstable angina pectoris: Secondary | ICD-10-CM | POA: Insufficient documentation

## 2020-08-15 DIAGNOSIS — Z7902 Long term (current) use of antithrombotics/antiplatelets: Secondary | ICD-10-CM | POA: Insufficient documentation

## 2020-08-15 DIAGNOSIS — I10 Essential (primary) hypertension: Secondary | ICD-10-CM | POA: Diagnosis present

## 2020-08-15 DIAGNOSIS — E119 Type 2 diabetes mellitus without complications: Secondary | ICD-10-CM | POA: Insufficient documentation

## 2020-08-15 DIAGNOSIS — Z9861 Coronary angioplasty status: Secondary | ICD-10-CM

## 2020-08-15 DIAGNOSIS — I200 Unstable angina: Secondary | ICD-10-CM | POA: Diagnosis present

## 2020-08-15 DIAGNOSIS — Z79899 Other long term (current) drug therapy: Secondary | ICD-10-CM | POA: Insufficient documentation

## 2020-08-15 DIAGNOSIS — Z7982 Long term (current) use of aspirin: Secondary | ICD-10-CM | POA: Insufficient documentation

## 2020-08-15 DIAGNOSIS — I252 Old myocardial infarction: Secondary | ICD-10-CM | POA: Insufficient documentation

## 2020-08-15 DIAGNOSIS — E785 Hyperlipidemia, unspecified: Secondary | ICD-10-CM | POA: Diagnosis present

## 2020-08-15 DIAGNOSIS — Z880 Allergy status to penicillin: Secondary | ICD-10-CM | POA: Insufficient documentation

## 2020-08-15 DIAGNOSIS — Z955 Presence of coronary angioplasty implant and graft: Secondary | ICD-10-CM | POA: Insufficient documentation

## 2020-08-15 DIAGNOSIS — Z8249 Family history of ischemic heart disease and other diseases of the circulatory system: Secondary | ICD-10-CM | POA: Diagnosis not present

## 2020-08-15 HISTORY — PX: LEFT HEART CATH AND CORONARY ANGIOGRAPHY: CATH118249

## 2020-08-15 HISTORY — PX: CORONARY BALLOON ANGIOPLASTY: CATH118233

## 2020-08-15 LAB — POCT ACTIVATED CLOTTING TIME
Activated Clotting Time: 237 seconds
Activated Clotting Time: 248 seconds
Activated Clotting Time: 486 seconds

## 2020-08-15 SURGERY — LEFT HEART CATH AND CORONARY ANGIOGRAPHY
Anesthesia: LOCAL

## 2020-08-15 MED ORDER — LIDOCAINE HCL (PF) 1 % IJ SOLN
INTRAMUSCULAR | Status: AC
  Filled 2020-08-15: qty 30

## 2020-08-15 MED ORDER — CLOPIDOGREL BISULFATE 75 MG PO TABS: 75.0000 mg | ORAL_TABLET | Freq: Every day | ORAL | Status: DC

## 2020-08-15 MED ORDER — NITROGLYCERIN 0.4 MG SL SUBL: 0.4000 mg | SUBLINGUAL_TABLET | SUBLINGUAL | Status: DC | PRN

## 2020-08-15 MED ORDER — ONDANSETRON HCL 4 MG/2ML IJ SOLN: 4.0000 mg | Freq: Four times a day (QID) | INTRAMUSCULAR | Status: DC | PRN

## 2020-08-15 MED ORDER — OMEGA-3-ACID ETHYL ESTERS 1 G PO CAPS: 2.0000 g | ORAL_CAPSULE | Freq: Two times a day (BID) | ORAL | Status: DC

## 2020-08-15 MED ORDER — SODIUM CHLORIDE 0.9% FLUSH: 3.0000 mL | Freq: Two times a day (BID) | INTRAVENOUS | Status: DC

## 2020-08-15 MED ORDER — MIDAZOLAM HCL 2 MG/2ML IJ SOLN
INTRAMUSCULAR | Status: AC
  Filled 2020-08-15: qty 2

## 2020-08-15 MED ORDER — FENTANYL CITRATE (PF) 100 MCG/2ML IJ SOLN
INTRAMUSCULAR | Status: DC | PRN
  Administered 2020-08-15: 25 ug via INTRAVENOUS

## 2020-08-15 MED ORDER — FENTANYL CITRATE (PF) 100 MCG/2ML IJ SOLN
INTRAMUSCULAR | Status: AC
  Filled 2020-08-15: qty 2

## 2020-08-15 MED ORDER — HEPARIN (PORCINE) IN NACL 1000-0.9 UT/500ML-% IV SOLN
INTRAVENOUS | Status: DC | PRN
  Administered 2020-08-15 (×2): 500 mL

## 2020-08-15 MED ORDER — SODIUM CHLORIDE 0.9 % IV SOLN: 250.0000 mL | INTRAVENOUS | Status: DC | PRN

## 2020-08-15 MED ORDER — LIDOCAINE HCL (PF) 1 % IJ SOLN
INTRAMUSCULAR | Status: DC | PRN
  Administered 2020-08-15: 2 mL

## 2020-08-15 MED ORDER — VERAPAMIL HCL 2.5 MG/ML IV SOLN
INTRAVENOUS | Status: AC
  Filled 2020-08-15: qty 2

## 2020-08-15 MED ORDER — SODIUM CHLORIDE 0.9% FLUSH: 3.0000 mL | INTRAVENOUS | Status: DC | PRN

## 2020-08-15 MED ORDER — ATORVASTATIN CALCIUM 80 MG PO TABS: 80.0000 mg | ORAL_TABLET | Freq: Every day | ORAL | Status: DC

## 2020-08-15 MED ORDER — PANTOPRAZOLE SODIUM 40 MG PO TBEC: 40.0000 mg | DELAYED_RELEASE_TABLET | Freq: Every day | ORAL | Status: DC

## 2020-08-15 MED ORDER — MIDAZOLAM HCL 2 MG/2ML IJ SOLN
INTRAMUSCULAR | Status: DC | PRN
  Administered 2020-08-15: 1 mg via INTRAVENOUS

## 2020-08-15 MED ORDER — IOHEXOL 350 MG/ML SOLN
INTRAVENOUS | Status: DC | PRN
  Administered 2020-08-15: 95 mL

## 2020-08-15 MED ORDER — ACETAMINOPHEN 325 MG PO TABS: 650.0000 mg | ORAL_TABLET | ORAL | Status: DC | PRN

## 2020-08-15 MED ORDER — HEPARIN SODIUM (PORCINE) 1000 UNIT/ML IJ SOLN
INTRAMUSCULAR | Status: DC | PRN
  Administered 2020-08-15: 2000 [IU] via INTRAVENOUS
  Administered 2020-08-15 (×2): 4000 [IU] via INTRAVENOUS

## 2020-08-15 MED ORDER — SODIUM CHLORIDE 0.9 % WEIGHT BASED INFUSION
1.0000 mL/kg/h | INTRAVENOUS | Status: DC
Start: 2020-08-15 — End: 2020-08-15

## 2020-08-15 MED ORDER — AMLODIPINE BESYLATE 5 MG PO TABS: 5.0000 mg | ORAL_TABLET | Freq: Every day | ORAL | Status: DC

## 2020-08-15 MED ORDER — ASPIRIN 81 MG PO CHEW: 81.0000 mg | CHEWABLE_TABLET | Freq: Every day | ORAL | Status: DC

## 2020-08-15 MED ORDER — HEPARIN SODIUM (PORCINE) 1000 UNIT/ML IJ SOLN
INTRAMUSCULAR | Status: AC
  Filled 2020-08-15: qty 1

## 2020-08-15 MED ORDER — HEPARIN (PORCINE) IN NACL 1000-0.9 UT/500ML-% IV SOLN
INTRAVENOUS | Status: AC
  Filled 2020-08-15: qty 1000

## 2020-08-15 MED ORDER — ACETAMINOPHEN 500 MG PO TABS: 500.0000 mg | ORAL_TABLET | Freq: Four times a day (QID) | ORAL | Status: DC | PRN

## 2020-08-15 MED ORDER — HEPARIN (PORCINE) IN NACL 2-0.9 UNITS/ML
INTRAMUSCULAR | Status: DC | PRN
  Administered 2020-08-15: 10 mL via INTRA_ARTERIAL

## 2020-08-15 MED ORDER — SODIUM CHLORIDE 0.9 % WEIGHT BASED INFUSION
3.0000 mL/kg/h | INTRAVENOUS | Status: AC
Start: 2020-08-15 — End: 2020-08-15
  Administered 2020-08-15: 3 mL/kg/h via INTRAVENOUS

## 2020-08-15 MED ORDER — SODIUM CHLORIDE 0.9 % WEIGHT BASED INFUSION: 1.0000 mL/kg/h | INTRAVENOUS | Status: DC

## 2020-08-15 MED ORDER — LISINOPRIL 20 MG PO TABS: 40.0000 mg | ORAL_TABLET | Freq: Every day | ORAL | Status: DC

## 2020-08-15 SURGICAL SUPPLY — 19 items
BALLN ~~LOC~~ EMERGE MR 3.0X12 (BALLOONS) ×2
BALLN ~~LOC~~ EMERGE MR 4.5X8 (BALLOONS) ×2
BALLOON ~~LOC~~ EMERGE MR 3.0X12 (BALLOONS) ×1 IMPLANT
BALLOON ~~LOC~~ EMERGE MR 4.5X8 (BALLOONS) ×1 IMPLANT
CATH 5FR JL3.5 JR4 ANG PIG MP (CATHETERS) ×2 IMPLANT
CATH INFINITI 5FR AL1 (CATHETERS) ×2 IMPLANT
CATH SHOCKWAVE 4.0X12 (CATHETERS) ×1 IMPLANT
CATH VISTA GUIDE 6FR AL1 (CATHETERS) ×2 IMPLANT
CATHETER SHOCKWAVE 4.0X12 (CATHETERS) ×2
DEVICE RAD COMP TR BAND LRG (VASCULAR PRODUCTS) ×2 IMPLANT
GLIDESHEATH SLEND SS 6F .021 (SHEATH) ×2 IMPLANT
KIT ENCORE 26 ADVANTAGE (KITS) ×2 IMPLANT
KIT HEART LEFT (KITS) ×2 IMPLANT
PACK CARDIAC CATHETERIZATION (CUSTOM PROCEDURE TRAY) ×2 IMPLANT
TRANSDUCER W/STOPCOCK (MISCELLANEOUS) ×2 IMPLANT
TUBING CIL FLEX 10 FLL-RA (TUBING) ×2 IMPLANT
WIRE ASAHI PROWATER 180CM (WIRE) ×2 IMPLANT
WIRE HI TORQ VERSACORE J 260CM (WIRE) ×2 IMPLANT
WIRE HI TORQ WHISPER MS 190CM (WIRE) ×2 IMPLANT

## 2020-08-15 NOTE — Progress Notes (Signed)
PA Vin into see pt

## 2020-08-15 NOTE — Interval H&P Note (Signed)
History and Physical Interval Note:  08/15/2020 7:14 AM  Terry Rivers  has presented today for surgery, with the diagnosis of chest pain - cad.  The various methods of treatment have been discussed with the patient and family. After consideration of risks, benefits and other options for treatment, the patient has consented to  Procedure(s): LEFT HEART CATH AND CORONARY ANGIOGRAPHY (N/A) as a surgical intervention.  The patient's history has been reviewed, patient examined, no change in status, stable for surgery.  I have reviewed the patient's chart and labs.  Questions were answered to the patient's satisfaction.   Cath Lab Visit (complete for each Cath Lab visit)  Clinical Evaluation Leading to the Procedure:   ACS: Yes.    Non-ACS:    Anginal Classification: CCS III  Anti-ischemic medical therapy: Minimal Therapy (1 class of medications)  Non-Invasive Test Results: No non-invasive testing performed  Prior CABG: No previous CABG        Terry Rivers 08/15/2020 7:15 AM

## 2020-08-15 NOTE — Discharge Summary (Signed)
Discharge Summary for Same Day PCI   Patient ID: Terry Rivers MRN: [id number]; DOB: July 31, 1950  Admit date: 08/15/2020 Discharge date: 08/15/2020  Primary Care Provider: [name], MD  Primary Cardiologist: Peter [name], MD  Primary Electrophysiologist:  None   Discharge Diagnoses    Principal Problem:   Unstable angina [facility]) Active Problems:   Hyperlipidemia LDL goal <70   Essential hypertension   CAD  Diagnostic Studies/Procedures    Cardiac Catheterization 08/15/2020:  CORONARY BALLOON ANGIOPLASTY  LEFT HEART CATH AND CORONARY ANGIOGRAPHY    Conclusion    Prox LAD lesion is 50% stenosed. Ost 1st Diag to 1st Diag lesion is 50% stenosed. Non-stenotic Ost Cx to Mid Cx lesion was previously treated. Mid RCA lesion is 35% stenosed. Prox RCA lesion is 90% stenosed. Post intervention, there is a 50% residual stenosis. Balloon angioplasty was performed using a BALLOON [place] EMERGE MR 4.5X8. The left ventricular systolic function is normal. LV end diastolic pressure is normal. The left ventricular ejection fraction is 55-65% by visual estimate.   1. Single vessel obstructive CAD with in stent restenosis focal in the proximal RCA. This lesion has been stented twice before with stent underexpansion. 2. Normal LV function 3. Normal LVEDP 4. Partially successful PCI of the proximal RCA with Shock wave therapy and aggressive expansion with an oversized 4.5 mm [place] balloon. Lesion reduced to 50% but stents still not fully expanded.   Plan: same day DC. Continue DAPT indefinitely. RCA lesion is at high risk for restenosis. If he has refractory angina despite medical therapy in the future may need to be considered for CABG.  Diagnostic Dominance: Right    Intervention      History of Present Illness     Terry Rivers is a 70 y.o. male with with a hx of CAD, bradycardia, DM (diet controlled), DJD presented for cath.   Admitted 2/28-04/17/15 with inferior STEMI.  LHC  demonstrated multivessel CAD with occluded RCA as the culprit. RCA was treated with DES.  He has residual LAD/Dx and LCx disease. He was not started on beta-blocker due to bradycardia. On 05/06/15 he underwent staged PCI of the proximal to mid LCx with a DES. The mid LAD had a 60% stenosis with FFR of 0.9 and was  treated medically.   Has done very well until March 2020 when he began experiencing intermittent exertional chest pain and dyspnea.  He was admitted in May and had repeat cardiac cath showing restenosis in the RCA treated with DES. Stents in the  LCx still patent.  LAD disease unchanged. For BP control his lisinopril dose was increased and amlodipine added.   Seen by Dr. [name] 08/05/20. Noted pain in his left chest with tightness in the precordial region after mowing. This was concerning for [place]. Cardiac catheterization was arranged for further evaluation.  Hospital Course     The patient underwent cardiac cath as noted above with single vessel obstructive CAD with in stent restenosis focal in the proximal RCA. This lesion has been stented twice before with stent underexpansion. S/p partially successful PCI of the proximal RCA with Shock wave therapy and aggressive expansion with an oversized 4.5 mm [place] balloon. Lesion reduced to 50% but stents still not fully expanded. Plan for DAPT with ASA/Plavix indefinitely. RCA lesion is at high risk for restenosis. If he has refractory angina despite medical therapy in the future may need to be considered for CABG.  The patient was seen by cardiac rehab while in  short stay. There were no observed complications post cath. Radial cath site was re-evaluated prior to discharge and found to be stable without any complications. Instructions/precautions regarding cath site care were given prior to discharge.  Terry Rivers was seen by Dr. [name] and determined stable for discharge home. Follow up with our office has been arranged. Medications are listed below.  Pertinent changes include None.   _____________  Cath/PCI Registry Performance & Quality Measures: Aspirin prescribed? - Yes ADP Receptor Inhibitor (Plavix/Clopidogrel, Brilinta/Ticagrelor or Effient/Prasugrel) prescribed (includes medically managed patients)? - Yes High Intensity Statin (Lipitor 40-80mg  or Crestor 20-40mg ) prescribed? - Yes For EF <40%, was ACEI/ARB prescribed? - Yes For EF <40%, Aldosterone Antagonist (Spironolactone or Eplerenone) prescribed? - Not Applicable (EF >/= 52%) Cardiac Rehab Phase II ordered (Included Medically managed Patients)? - Yes   Discharge Vitals Blood pressure 130/75, pulse (!) 49, temperature 97.6 F (36.4 C), temperature source Oral, resp. rate 16, height 5\' 5"  (1.651 m), weight 71.7 kg, SpO2 97 %.  Filed Weights   08/15/20 0535  Weight: 71.7 kg    Last Labs & Radiologic Studies     _____________  CARDIAC CATHETERIZATION  Result Date: 08/15/2020  Prox LAD lesion is 50% stenosed.  Ost 1st Diag to 1st Diag lesion is 50% stenosed.  Non-stenotic Ost Cx to Mid Cx lesion was previously treated.  Mid RCA lesion is 35% stenosed.  Prox RCA lesion is 90% stenosed.  Post intervention, there is a 50% residual stenosis.  Balloon angioplasty was performed using a BALLOON [place] EMERGE MR 4.5X8.  The left ventricular systolic function is normal.  LV end diastolic pressure is normal.  The left ventricular ejection fraction is 55-65% by visual estimate.  1. Single vessel obstructive CAD with in stent restenosis focal in the proximal RCA. This lesion has been stented twice before with stent underexpansion. 2. Normal LV function 3. Normal LVEDP 4. Partially successful PCI of the proximal RCA with Shock wave therapy and aggressive expansion with an oversized 4.5 mm [place] balloon. Lesion reduced to 50% but stents still not fully expanded. Plan: same day DC. Continue DAPT indefinitely. RCA lesion is at high risk for restenosis. If he has refractory angina despite  medical therapy in the future may need to be considered for CABG.    Disposition   Pt is being discharged home today in good condition.  Follow-up Plans & Appointments     Follow-up [facility] Follow up on 08/29/2020.   Specialty: Cardiology Why: @8 :30am for cath follow up with Laurann Montana, NP. Please arrive 15 minutes early Contact information: [address] [place] [address] [phone]               Discharge Instructions     Amb Referral to Cardiac Rehabilitation   Complete by: As directed    Diagnosis: PTCA   After initial evaluation and assessments completed: Virtual Based Care may be provided alone or in conjunction with Phase 2 Cardiac Rehab based on patient barriers.: Yes        Discharge Medications   Allergies as of 08/15/2020       Reactions   Penicillins Anaphylaxis, Shortness Of Breath, Swelling   Joints became swollen /turned purple Did it involve swelling of the face/tongue/throat, SOB, or low BP? No Did it involve sudden or severe rash/hives, skin peeling, or any reaction on the inside of your mouth or nose? No Did you need to seek medical attention  at a hospital or doctor's office? Yes When did it last happen? "Many years ago"    If all above answers are "NO", may proceed with cephalosporin use.   Prednisone Swelling, Other (See Comments)   Made the patient's joints swell to the point where he could not walk        Medication List     TAKE these medications    acetaminophen 500 MG tablet Commonly known as: TYLENOL Take 500-1,000 mg by mouth every 6 (six) hours as needed for mild pain or headache.   amLODipine 5 MG tablet Commonly known as: NORVASC TAKE 1 TABLET BY MOUTH DAILY   aspirin 81 MG chewable tablet Chew 1 tablet (81 mg total) by mouth daily.   atorvastatin 80 MG tablet Commonly known as: LIPITOR TAKE 1 TABLET (80 MG TOTAL) BY MOUTH DAILY  AT 6 PM.   clopidogrel 75 MG tablet Commonly known as: PLAVIX TAKE 1 TABLET (75 MG TOTAL) BY MOUTH DAILY WITH BREAKFAST.   lisinopril 40 MG tablet Commonly known as: ZESTRIL Take 1 tablet (40 mg total) by mouth daily. What changed: Another medication with the same name was removed. Continue taking this medication, and follow the directions you see here.   nitroGLYCERIN 0.4 MG SL tablet Commonly known as: NITROSTAT Place 1 tablet (0.4 mg total) under the tongue every 5 (five) minutes x 3 doses as needed for chest pain.   omega-3 acid ethyl esters 1 g capsule Commonly known as: Lovaza Take 2 capsules (2 g total) by mouth 2 (two) times daily.       ASK your doctor about these medications    pantoprazole 40 MG tablet Commonly known as: PROTONIX TAKE 1 TABLET BY MOUTH DAILY BEFORE BREAKFAST           Allergies Allergies  Allergen Reactions   Penicillins Anaphylaxis, Shortness Of Breath and Swelling    Joints became swollen /turned purple Did it involve swelling of the face/tongue/throat, SOB, or low BP? No Did it involve sudden or severe rash/hives, skin peeling, or any reaction on the inside of your mouth or nose? No Did you need to seek medical attention at a hospital or doctor's office? Yes When did it last happen? "Many years ago"    If all above answers are "NO", may proceed with cephalosporin use.     Prednisone Swelling and Other (See Comments)    Made the patient's joints swell to the point where he could not walk    Outstanding Labs/Studies   None  Duration of Discharge Encounter   Greater than 30 minutes including physician time.  Jarrett Soho, PA 08/15/2020, 2:17 PM

## 2020-08-15 NOTE — Progress Notes (Signed)
[id number] Pt has been seen by [place] in the past. Discussed importance of plavix. Reviewed NTG use, walking for ex, gave heart healthy diet and discussed food choices. Pt has a big garden and eats a lot of vegetables and eggs. Pt continues to chew but is trying to cut back. Tobacco cessation handout given. Discussed CRP 2 and referred to [place]. Pt doubtful he will attend as he stated he is very busy keeping up garden and several acres yard. Voiced understanding of ed. Graylon Good RN BSN 08/15/2020 11:16 AM

## 2020-08-15 NOTE — Discharge Instructions (Signed)
Radial Site Care  This sheet gives you information about how to care for yourself after your procedure. Your health care provider may also give you more specific instructions. If you have problems or questions, contact your health care provider. What can I expect after the procedure? After the procedure, it is common to have: Bruising and tenderness at the catheter insertion area. Follow these instructions at home: Medicines Take over-the-counter and prescription medicines only as told by your health care provider. Insertion site care Follow instructions from your health care provider about how to take care of your insertion site. Make sure you: Wash your hands with soap and water before you remove your bandage (dressing). If soap and water are not available, use hand sanitizer. May remove dressing in 24 hours. Check your insertion site every day for signs of infection. Check for: Redness, swelling, or pain. Fluid or blood. Pus or a bad smell. Warmth. Do no take baths, swim, or use a hot tub for 5 days. You may shower 24-48 hours after the procedure. Remove the dressing and gently wash the site with plain soap and water. Pat the area dry with a clean towel. Do not rub the site. That could cause bleeding. Do not apply powder or lotion to the site. Activity  For 24 hours after the procedure, or as directed by your health care provider: Do not flex or bend the affected arm. Do not push or pull heavy objects with the affected arm. Do not drive yourself home from the hospital or clinic. You may drive 24 hours after the procedure. Do not operate machinery or power tools. KEEP ARM ELEVATED THE REMAINDER OF THE DAY. Do not push, pull or lift anything that is heavier than 10 lb for 5 days. Ask your health care provider when it is okay to: Return to work or school. Resume usual physical activities or sports. Resume sexual activity. General instructions If the catheter site starts to  bleed, raise your arm and put firm pressure on the site. If the bleeding does not stop, get help right away. This is a medical emergency. DRINK PLENTY OF FLUIDS FOR THE NEXT 2-3 DAYS. No alcohol consumption for 24 hours after receiving sedation. If you went home on the same day as your procedure, a responsible adult should be with you for the first 24 hours after you arrive home. Keep all follow-up visits as told by your health care provider. This is important. Contact a health care provider if: You have a fever. You have redness, swelling, or yellow drainage around your insertion site. Get help right away if: You have unusual pain at the radial site. The catheter insertion area swells very fast. The insertion area is bleeding, and the bleeding does not stop when you hold steady pressure on the area. Your arm or hand becomes pale, cool, tingly, or numb. These symptoms may represent a serious problem that is an emergency. Do not wait to see if the symptoms will go away. Get medical help right away. Call your local emergency services (911 in the [place] Do not drive yourself to the hospital. Summary After the procedure, it is common to have bruising and tenderness at the site. Follow instructions from your health care provider about how to take care of your radial site wound. Check the wound every day for signs of infection.  This information is not intended to replace advice given to you by your health care provider. Make sure you discuss any questions you have with   your health care provider. Document Revised: 03/09/2017 Document Reviewed: 03/09/2017 Elsevier Patient Education  2020 Elsevier Inc.  

## 2020-08-15 NOTE — Progress Notes (Signed)
Discharge instructions reviewed with pt and his wife (via telephone) both voice understanding.  

## 2020-08-29 ENCOUNTER — Telehealth: Payer: Self-pay | Admitting: Family Medicine

## 2020-08-29 ENCOUNTER — Ambulatory Visit (INDEPENDENT_AMBULATORY_CARE_PROVIDER_SITE_OTHER): Payer: Medicare Other | Admitting: Family

## 2020-08-29 ENCOUNTER — Other Ambulatory Visit: Payer: Self-pay

## 2020-08-29 ENCOUNTER — Encounter (HOSPITAL_BASED_OUTPATIENT_CLINIC_OR_DEPARTMENT_OTHER): Payer: Self-pay | Admitting: Family

## 2020-08-29 VITALS — BP 134/72 | HR 53 | Ht 65.0 in | Wt 157.0 lb

## 2020-08-29 DIAGNOSIS — I10 Essential (primary) hypertension: Secondary | ICD-10-CM

## 2020-08-29 DIAGNOSIS — I25118 Atherosclerotic heart disease of native coronary artery with other forms of angina pectoris: Secondary | ICD-10-CM | POA: Diagnosis not present

## 2020-08-29 DIAGNOSIS — E785 Hyperlipidemia, unspecified: Secondary | ICD-10-CM

## 2020-08-29 NOTE — Patient Instructions (Addendum)
Medication Instructions:  Continue your current medications.   *If you need a refill on your cardiac medications before your next appointment, please call your pharmacy*   Lab Work: Your provider recommends lab work on the day you see Dr. [name] to include a fasting lipid panel and CMP. You can have this collected after your appointment.   If you have labs (blood work) drawn today and your tests are completely normal, you will receive your results only by: [place] (if you have MyChart) OR A paper copy in the mail If you have any lab test that is abnormal or we need to change your treatment, we will call you to review the results.   Testing/Procedures: None ordered today   Follow-Up: At [facility], you and your health needs are our priority.  As part of our continuing mission to provide you with exceptional heart care, we have created designated Provider Care Teams.  These Care Teams include your primary Cardiologist (physician) and Advanced Practice Providers (APPs -  Physician Assistants and Nurse Practitioners) who all work together to provide you with the care you need, when you need it.  We recommend signing up for the patient portal called "MyChart".  Sign up information is provided on this After Visit Summary.  MyChart is used to connect with patients for Virtual Visits (Telemedicine).  Patients are able to view lab/test results, encounter notes, upcoming appointments, etc.  Non-urgent messages can be sent to your provider as well.   To learn more about what you can do with MyChart, go to [email or web address].    Your next appointment:   October 10th as scheduled with Dr. [name]   Other Instructions  For coronary artery disease often called "heart disease" we aim for optimal guideline directed medical therapy. We use the "A, B, C"s to help keep [place] on track!  A = Aspirin 81mg  daily B = Blood pressure control C = Cholesterol control. You take Atorvastatin and  Lovaza  to help control your cholesterol.  D = Don't forget nitroglycerin! This is an emergency tablet to be used if you have chest pain. E = Extras. In your case, this is Plavix to help protect your stent.   Heart Healthy Diet Recommendations: A low-salt diet is recommended. Meats should be grilled, baked, or boiled. Avoid fried foods. Focus on lean protein sources like fish or chicken with vegetables and fruits. The American Heart Association is a Microbiologist!  American Heart Association Diet and Lifeystyle Recommendations    Exercise recommendations: The American Heart Association recommends 150 minutes of moderate intensity exercise weekly. Try 30 minutes of moderate intensity exercise 4-5 times per week. This could include walking, jogging, or swimming.  Tips to Measure your Blood Pressure Correctly  Recommend checking blood pressure twice per week and keeping a log. If your blood pressure is consistently more than 130/80, please call our office.  Here's what you can do to ensure a correct reading:  Don't drink a caffeinated beverage or smoke during the 30 minutes before the test.  Sit quietly for five minutes before the test begins.  During the measurement, sit in a chair with your feet on the floor and your arm supported so your elbow is at about heart level.  The inflatable part of the cuff should completely cover at least 80% of your upper arm, and the cuff should be placed on bare skin, not over a shirt.  Don't talk during the measurement.  Have your blood pressure measured twice,  with a brief break in between. If the readings are different by 5 points or more, have it done a third time.   Blood pressure categories  Blood pressure category SYSTOLIC (upper number)  DIASTOLIC (lower number)  Normal Less than 120 mm Hg and Less than 80 mm Hg  Elevated 120-129 mm Hg and Less than 80 mm Hg  High blood pressure: Stage 1 hypertension 130-139 mm Hg or 80-89 mm Hg  High blood  pressure: Stage 2 hypertension 140 mm Hg or higher or 90 mm Hg or higher  Hypertensive crisis (consult your doctor immediately) Higher than 180 mm Hg and/or Higher than 120 mm Hg  Source: American Heart Association and American Stroke Association. For more on getting your blood pressure under control, buy Controlling Your Blood Pressure, a Special Health Report from [facility].   Blood Pressure Log   Date   Time  Blood Pressure  Position  Example: Nov 1 9 AM 124/78 sitting

## 2020-08-29 NOTE — Telephone Encounter (Signed)
Left message for patient to call back and schedule Medicare Annual Wellness Visit (AWV) either virtually or in office.   Last AWV 01/28/16  please schedule at anytime with [facility] Nurse Health Advisor 1 or 2   This should be a 45 minute visit.

## 2020-08-29 NOTE — Progress Notes (Signed)
Office Visit    Patient Name: Terry Rivers Date of Encounter: 08/29/2020  PCP:  [name], MD   [place]  Cardiologist:  Peter [name], MD  Advanced Practice Provider:  No care team member to display Electrophysiologist:  None    Chief Complaint    Terry Rivers is a 70 y.o. male with a hx of CAD, bradycardia, diet-controlled diabetes mellitus, DJD presents today for follow-up after cardiac catheterization  Past Medical History    Past Medical History:  Diagnosis Date   Arthritis    Benign essential HTN    Bradycardia    low 50"s per wife   CAD, multiple vessel-native arteries, emergent PCI RCA with DES 04/15/15 2017   a. 03/2014- inferior STEMI with occluded RCA; b. 04/2015: Staged PCI of LCx c. 07/13/18 PCI to [facility] for ISR   Chicken pox    Diabetes mellitus without complication ([place])    Borderline/no meds   Frequent headaches    GERD (gastroesophageal reflux disease)    Hyperlipidemia LDL goal <70 04/17/2015   Myocardial infarction ([place]) 03/2015   Tobacco use 04/17/2015   Unstable angina ([place]) 06/2018   Past Surgical History:  Procedure Laterality Date   CARDIAC CATHETERIZATION N/A 04/15/2015   Procedure: Left Heart Cath and Coronary Angiography;  Surgeon: Peter M [name], MD;  Location: [place] [facility];  Service: Cardiovascular;  Laterality: N/A;   CARDIAC CATHETERIZATION N/A 04/15/2015   Procedure: Coronary Stent Intervention;  Surgeon: Peter M [name], MD;  Location: [place] [facility];  Service: Cardiovascular;  Laterality: N/A;   CARDIAC CATHETERIZATION N/A 05/06/2015   Procedure: Coronary Stent Intervention;  Surgeon: Peter M [name], MD;  Location: [place] [facility];  Service: Cardiovascular;  Laterality: N/A;   CARDIAC CATHETERIZATION  05/06/2015   Procedure: Coronary/Graft Angiography;  Surgeon: Peter M [name], MD;  Location: [place] [facility];  Service: Cardiovascular;;   COLONOSCOPY     CORONARY BALLOON ANGIOPLASTY N/A  07/13/2018   Procedure: CORONARY BALLOON ANGIOPLASTY;  Surgeon: [name], MD;  Location: [place] [facility];  Service: Cardiovascular;  Laterality: N/A;   CORONARY BALLOON ANGIOPLASTY N/A 08/15/2020   Procedure: CORONARY BALLOON ANGIOPLASTY;  Surgeon: [name], Peter M, MD;  Location: [place] [facility];  Service: Cardiovascular;  Laterality: N/A;   CORONARY STENT INTERVENTION N/A 07/13/2018   Procedure: CORONARY STENT INTERVENTION;  Surgeon: [name], MD;  Location: [place] [facility];  Service: Cardiovascular;  Laterality: N/A;   CORONARY STENT PLACEMENT  05/06/2015   LT CIRC DES X2   LEFT HEART CATH AND CORONARY ANGIOGRAPHY N/A 07/13/2018   Procedure: LEFT HEART CATH AND CORONARY ANGIOGRAPHY;  Surgeon: [name], MD;  Location: [place] [facility];  Service: Cardiovascular;  Laterality: N/A;   LEFT HEART CATH AND CORONARY ANGIOGRAPHY N/A 08/15/2020   Procedure: LEFT HEART CATH AND CORONARY ANGIOGRAPHY;  Surgeon: [name], Peter M, MD;  Location: [place] [facility];  Service: Cardiovascular;  Laterality: N/A;   POLYPECTOMY     SKIN CANCER EXCISION     right forearm/pre-cancerous/ 2 spots on left arm    Allergies  Allergies  Allergen Reactions   Penicillins Anaphylaxis, Shortness Of Breath and Swelling    Joints became swollen /turned purple Did it involve swelling of the face/tongue/throat, SOB, or low BP? No Did it involve sudden or severe rash/hives, skin peeling, or any reaction on the inside of your mouth or nose? No Did you need  to seek medical attention at a hospital or doctor's office? Yes When did it last happen? "Many years ago"    If all above answers are "NO", may proceed with cephalosporin use.     Prednisone Swelling and Other (See Comments)    Made the patient's joints swell to the point where he could not walk    History of Present Illness    Terry Rivers is a 70 y.o. male with a hx of CAD, bradycardia, diet-controlled diabetes, DJD last seen  08/15/2020 for cardiac catheterization.  He was admitted February 2017 with inferior STEMI.  LHC with multivessel CAD with occluded RCA as culprit.  RCA was treated with DES.  He has known residual LAD/diagonal and left circumflex disease.  He was cardiac.  On 04/2015 he underwent staged PCI of the proximal to mid LCx with DES.  Mid LAD has 60% stenosis with FFR of 0.90.  He was doing well until March 2020 been experiencing intermittent exertional chest pain and dyspnea.  He was admitted May 2020 with repeat cardiac catheterization showing restenosed RCA treated with DES.  Stent to LCx were patent.  LAD disease was unchanged.  His lisinopril dose was increased and amlodipine added for optimal blood pressure control.  He was seen in clinic 08/05/20 by Dr. [name] increasing episodes of angina.  He was recommended for cardiac catheterization.  Mobile.  Was added for optimal triglyceride control.  On 08/15/2020 underwent cardiac catheterization showing single-vessel obstructive CAD with in-stent restenosis focal in the proximal RCA.  Of note lesion has been stented twice before a stent underexpansion.  He underwent partially successful PCI of proximal RCA with short therapy and aggressive expansion with oversized 4.5 [place] balloon with lesion reduced to 50% but stent still not fully expanded.  He was recommended for continued indefinite DAPT.  RCA lesion noted to be high risk for restenosis per Dr. [name] if he has refractory angina despite medical therapy in the future may need to be considered for CABG.  He presents today for follow-up with his wife.  Reports feeling overall well since PCI.  Reports no recurrent angina.  He has been staying active by walking 15 minutes twice per day as well as working in his garden.  Reports no shortness of breath nor dyspnea on exertion. No edema, orthopnea, PND. Reports no palpitations.  His right radial catheterization site is healing well and not causing discomfort.  We reviewed  his cardiac catheterization report in detail and he and his wife noted great understanding.  EKGs/Labs/Other Studies Reviewed:   The following studies were reviewed today:  LHC 08/15/20 Prox LAD lesion is 50% stenosed. Ost 1st Diag to 1st Diag lesion is 50% stenosed. Non-stenotic Ost Cx to Mid Cx lesion was previously treated. Mid RCA lesion is 35% stenosed. Prox RCA lesion is 90% stenosed. Post intervention, there is a 50% residual stenosis. Balloon angioplasty was performed using a BALLOON [place] EMERGE MR 4.5X8. The left ventricular systolic function is normal. LV end diastolic pressure is normal. The left ventricular ejection fraction is 55-65% by visual estimate.   1. Single vessel obstructive CAD with in stent restenosis focal in the proximal RCA. This lesion has been stented twice before with stent underexpansion. 2. Normal LV function 3. Normal LVEDP 4. Partially successful PCI of the proximal RCA with Shock wave therapy and aggressive expansion with an oversized 4.5 mm  balloon. Lesion reduced to 50% but stents still not fully expanded.   Plan: same day DC. Continue  DAPT indefinitely. RCA lesion is at high risk for restenosis. If he has refractory angina despite medical therapy in the future may need to be considered for CABG.   Echo 07/13/18: IMPRESSIONS      1. The left ventricle has normal systolic function with an ejection fraction of 60-65%. The cavity size was normal. Left ventricular diastolic Doppler parameters are consistent with impaired relaxation. No evidence of left ventricular regional wall motion abnormalities.  2. The right ventricle has normal systolic function. The cavity was normal. There is no increase in right ventricular wall thickness.     Cardiac cath/PCI 07/13/18: CORONARY BALLOON ANGIOPLASTY  CORONARY STENT INTERVENTION  LEFT HEART CATH AND CORONARY ANGIOGRAPHY  Conclusion     Prox RCA to Mid RCA lesion is 90% stenosed. Mid RCA lesion is 60%  stenosed. Previously placed Ost Cx to Mid Cx stent (unknown type) is widely patent. Prox LAD lesion is 50% stenosed. Ost 1st Diag to 1st Diag lesion is 50% stenosed. Post intervention, there is a 25% residual stenosis. A stent was successfully placed. Post intervention, there is a 25% residual stenosis. The left ventricular systolic function is normal. LV end diastolic pressure is normal. The left ventricular ejection fraction is 50-55% by visual estimate.    EKG:  No EKG today  Recent Labs: 04/21/2020: ALT 29 08/08/2020: BUN 11; Creatinine, Ser 0.99; Hemoglobin 15.2; Platelets 244; Potassium 4.3; Sodium 140  Recent Lipid Panel    Component Value Date/Time   CHOL 109 04/21/2020 0843   CHOL 97 (L) 05/10/2019 0850   TRIG 374.0 (H) 04/21/2020 0843   HDL 21.30 (L) 04/21/2020 0843   HDL 19 (L) 05/10/2019 0850   CHOLHDL 5 04/21/2020 0843   VLDL 74.8 (H) 04/21/2020 0843   LDLCALC 37 05/10/2019 0850   LDLDIRECT 44.0 04/21/2020 0843    Home Medications   Current Meds  Medication Sig   acetaminophen (TYLENOL) 500 MG tablet Take 500-1,000 mg by mouth every 6 (six) hours as needed for mild pain or headache.   amLODipine (NORVASC) 5 MG tablet TAKE 1 TABLET BY MOUTH DAILY   aspirin 81 MG chewable tablet Chew 1 tablet (81 mg total) by mouth daily.   atorvastatin (LIPITOR) 80 MG tablet TAKE 1 TABLET (80 MG TOTAL) BY MOUTH DAILY AT 6 PM.   clopidogrel (PLAVIX) 75 MG tablet TAKE 1 TABLET (75 MG TOTAL) BY MOUTH DAILY WITH BREAKFAST.   lisinopril (ZESTRIL) 40 MG tablet Take 1 tablet (40 mg total) by mouth daily.   nitroGLYCERIN (NITROSTAT) 0.4 MG SL tablet Place 1 tablet (0.4 mg total) under the tongue every 5 (five) minutes x 3 doses as needed for chest pain.   omega-3 acid ethyl esters (LOVAZA) 1 g capsule Take 2 capsules (2 g total) by mouth 2 (two) times daily.   pantoprazole (PROTONIX) 40 MG tablet TAKE 1 TABLET BY MOUTH DAILY BEFORE BREAKFAST (Patient taking differently: Take 40 mg by mouth  daily.)   Current Facility-Administered Medications for the 08/29/20 encounter (Office Visit) with Loel Dubonnet, NP  Medication   sodium chloride flush (NS) 0.9 % injection 3 mL     Review of Systems      All other systems reviewed and are otherwise negative except as noted above.  Physical Exam    VS:  BP 134/72   Pulse (!) 53   Ht $R'5\' 5"'xZ$  (1.651 m)   Wt 157 lb (71.2 kg)   BMI 26.13 kg/m  , BMI Body mass index is 26.13 kg/m.  Wt Readings from Last 3 Encounters:  08/29/20 157 lb (71.2 kg)  08/15/20 158 lb (71.7 kg)  08/05/20 158 lb 6.4 oz (71.8 kg)    GEN: Well nourished, well developed, in no acute distress. HEENT: normal. Neck: Supple, no JVD, carotid bruits, or masses. Cardiac: RRR, no murmurs, rubs, or gallops. No clubbing, cyanosis, edema.  Radials/PT 2+ and equal bilaterally.  Respiratory:  Respirations regular and unlabored, clear to auscultation bilaterally. GI: Soft, nontender, nondistended. MS: No deformity or atrophy. Skin: Warm and dry, no rash. R radial catheterization site healing appropriately with no ecchymosis nor signs of infection.  Neuro:  Strength and sensation are intact. Psych: Normal affect.  Assessment & Plan    CAD-s/p inferior STEMI March 2017 with DES to RCA and staged PCI of the proximal to mid LCx with DES.  May 2020 s/p repeat PCI of RCA with DES. 08/15/20 s/p PCI of RCA with shock wave therapy. Lesion reduced to 50% but previous stents still not fully expanded.  Right radial catheterization site healing appropriately.  Recommended for indefinite DAPT.  Continue aspirin, Plavix, high intensity statin, Lovaza, PRN nitroglycerin. Heart healthy diet and regular cardiovascular exercise encouraged.    HTN- BP well controlled. Continue current antihypertensive regimen including amlodipine 5 mg daily and lisinopril 40 mg daily.  He was agreeable to monitor blood pressure twice per week at home and keep a log and bring to his next clinic visit.  He will  report blood pressure consistently greater than 130/80.  HLD, LDL goal less than 70- 04/21/20 LDL 44, triglycerides 374. Lovaza started 05/08/20. Continue Atorvastatin 24m daily.  Plan for lipid panel, c-Met on day of his next appointment.  Ordered today.  Disposition: Follow up in October as scheduled  with Dr. JMartiniqueor APP.  Signed, CLoel Dubonnet NP 08/29/2020, 8:53 AM [place]

## 2020-09-03 ENCOUNTER — Ambulatory Visit: Payer: Medicare Other | Admitting: Family Medicine

## 2020-09-04 IMAGING — DX PORTABLE CHEST - 1 VIEW
1 series · 1 of 1 positions shown · non-contrast
Comparison: 04/30/2015

CLINICAL DATA: Short of breath

EXAM:
PORTABLE CHEST 1 VIEW

[chest ap]
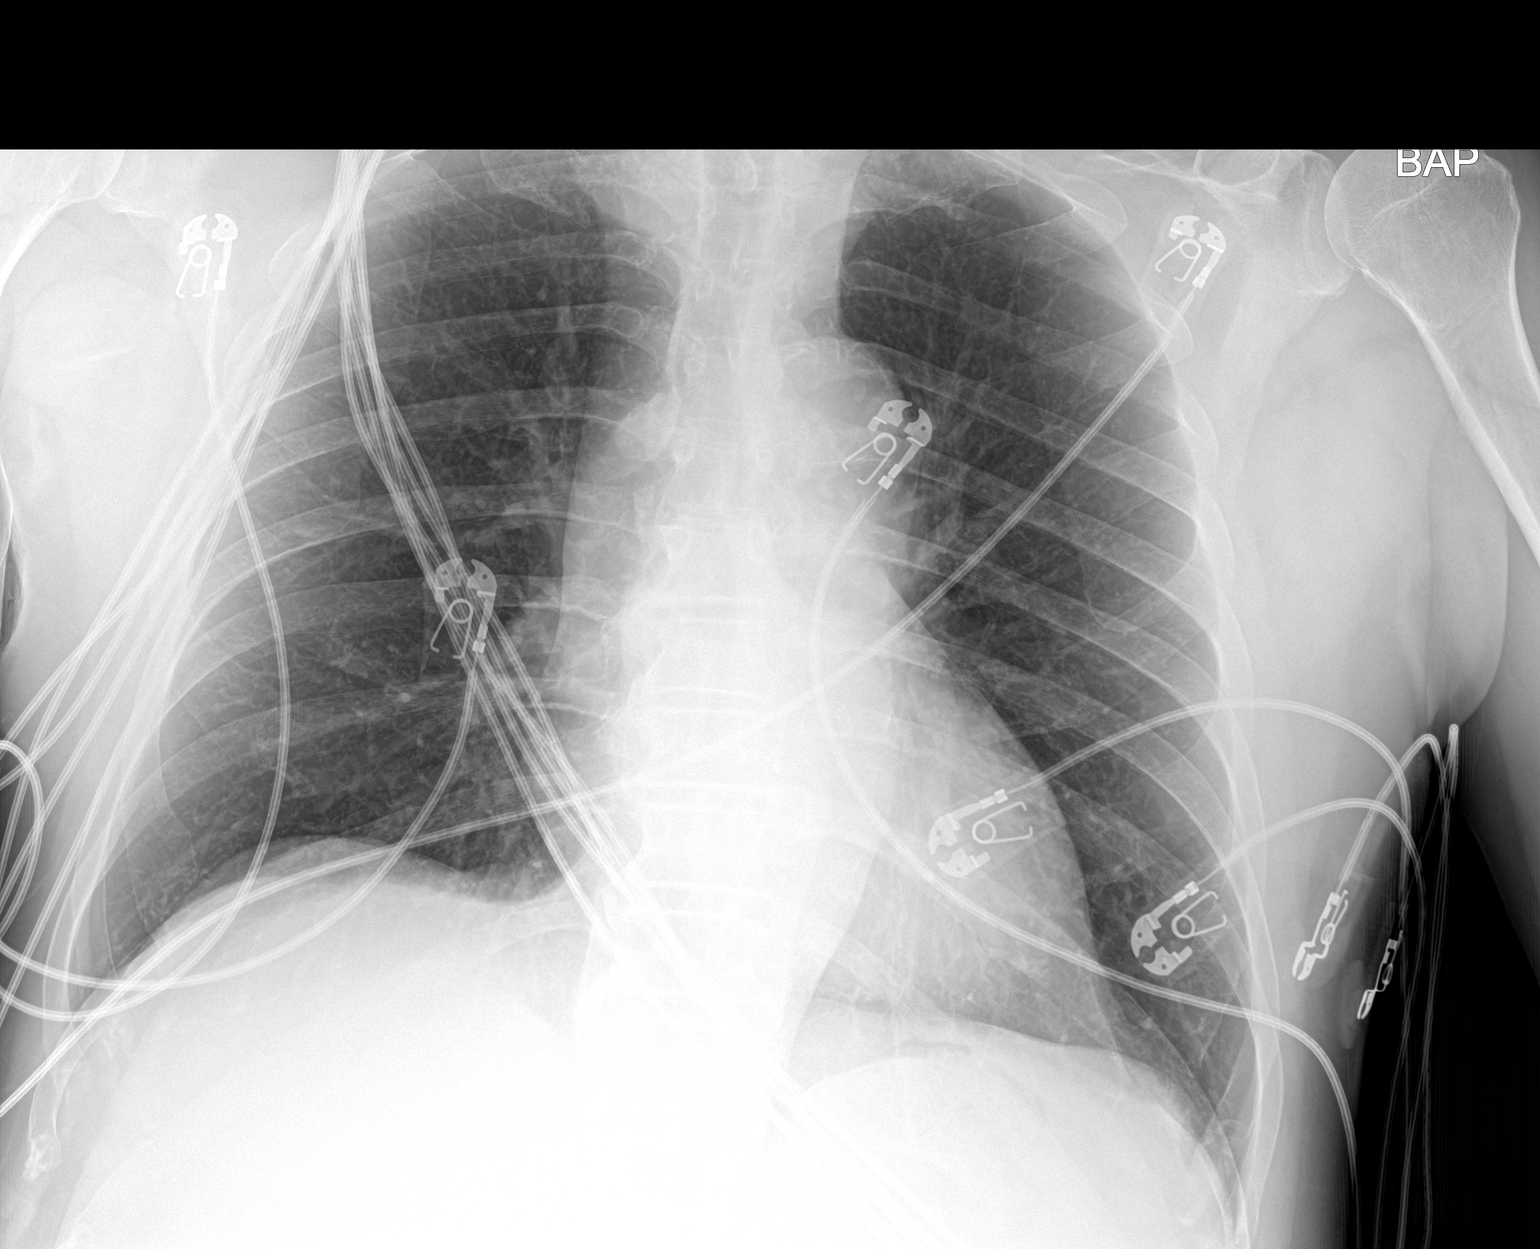

[1 of 1 positions shown; findings below may reference images not displayed]

FINDINGS: Normal mediastinum and cardiac silhouette. Normal pulmonary
vasculature. No evidence of effusion, infiltrate, or pneumothorax.
No acute bony abnormality.
IMPRESSION: No acute cardiopulmonary process.

## 2020-09-17 IMAGING — CR LUMBAR SPINE - 2-3 VIEW
3 series · 3 of 3 positions shown · non-contrast
Comparison: None.

CLINICAL DATA: Low back pain radiating into the posterior aspect of
the left hip and ankle for 1 week. No known injury.

EXAM:
LUMBAR SPINE - 2-3 VIEW

[t lumbar spine ap]
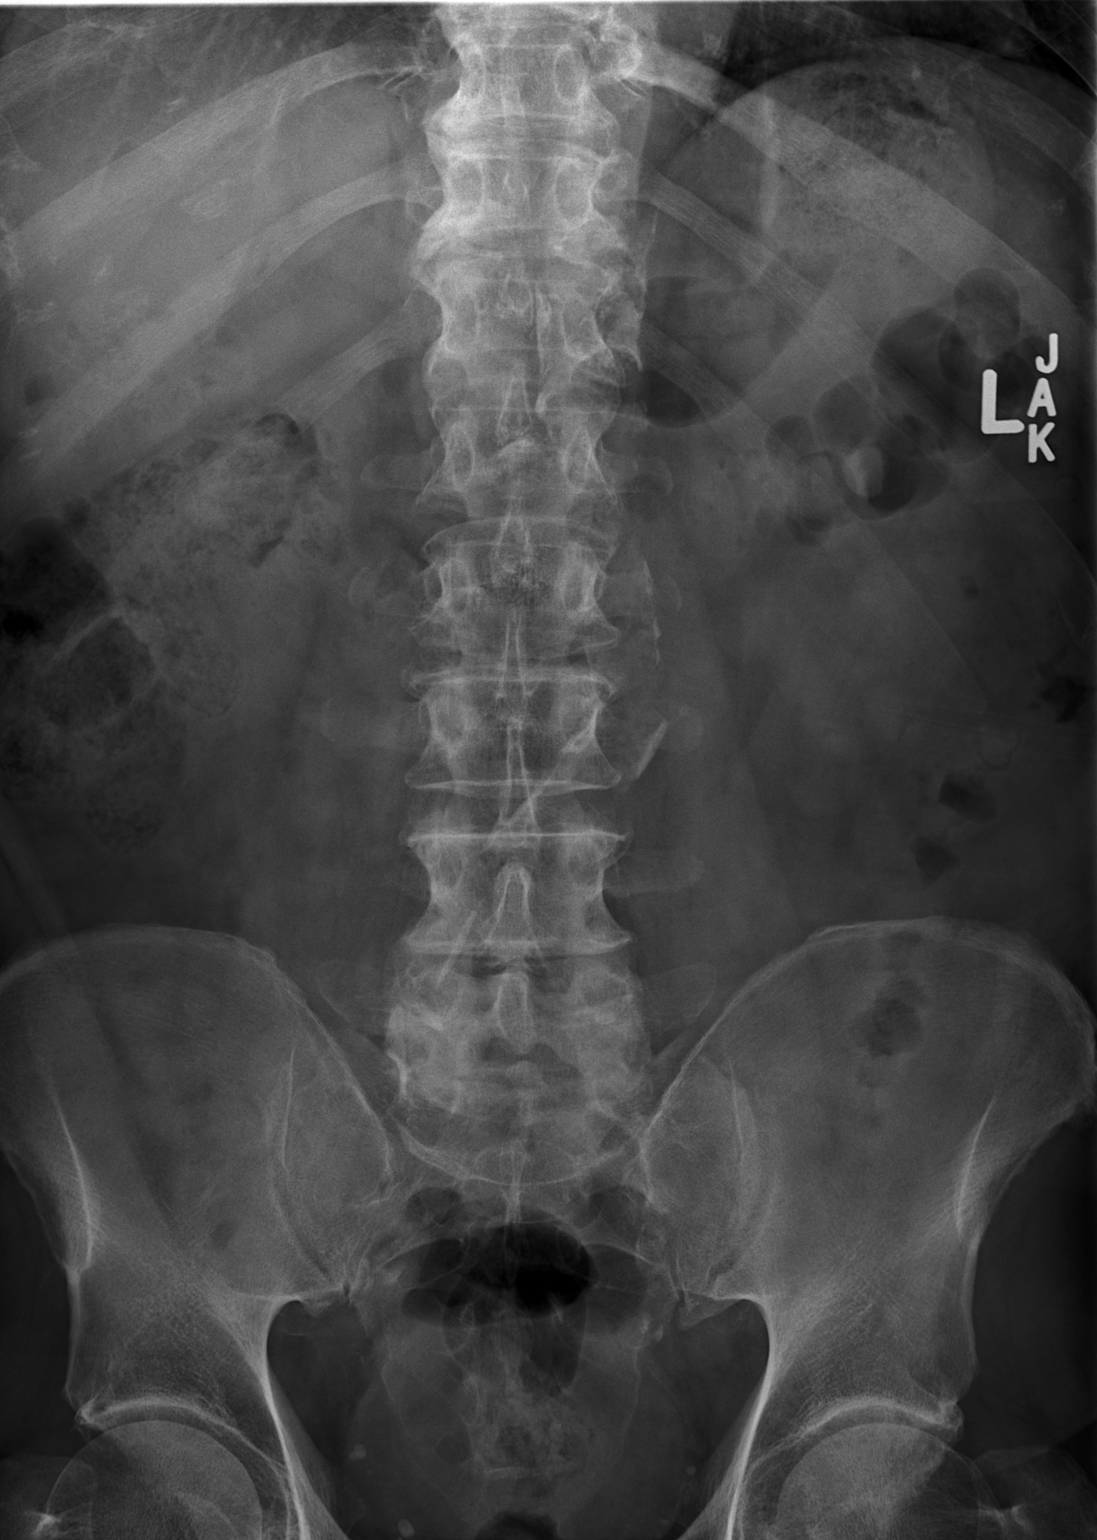

[t lumbar spine lat]
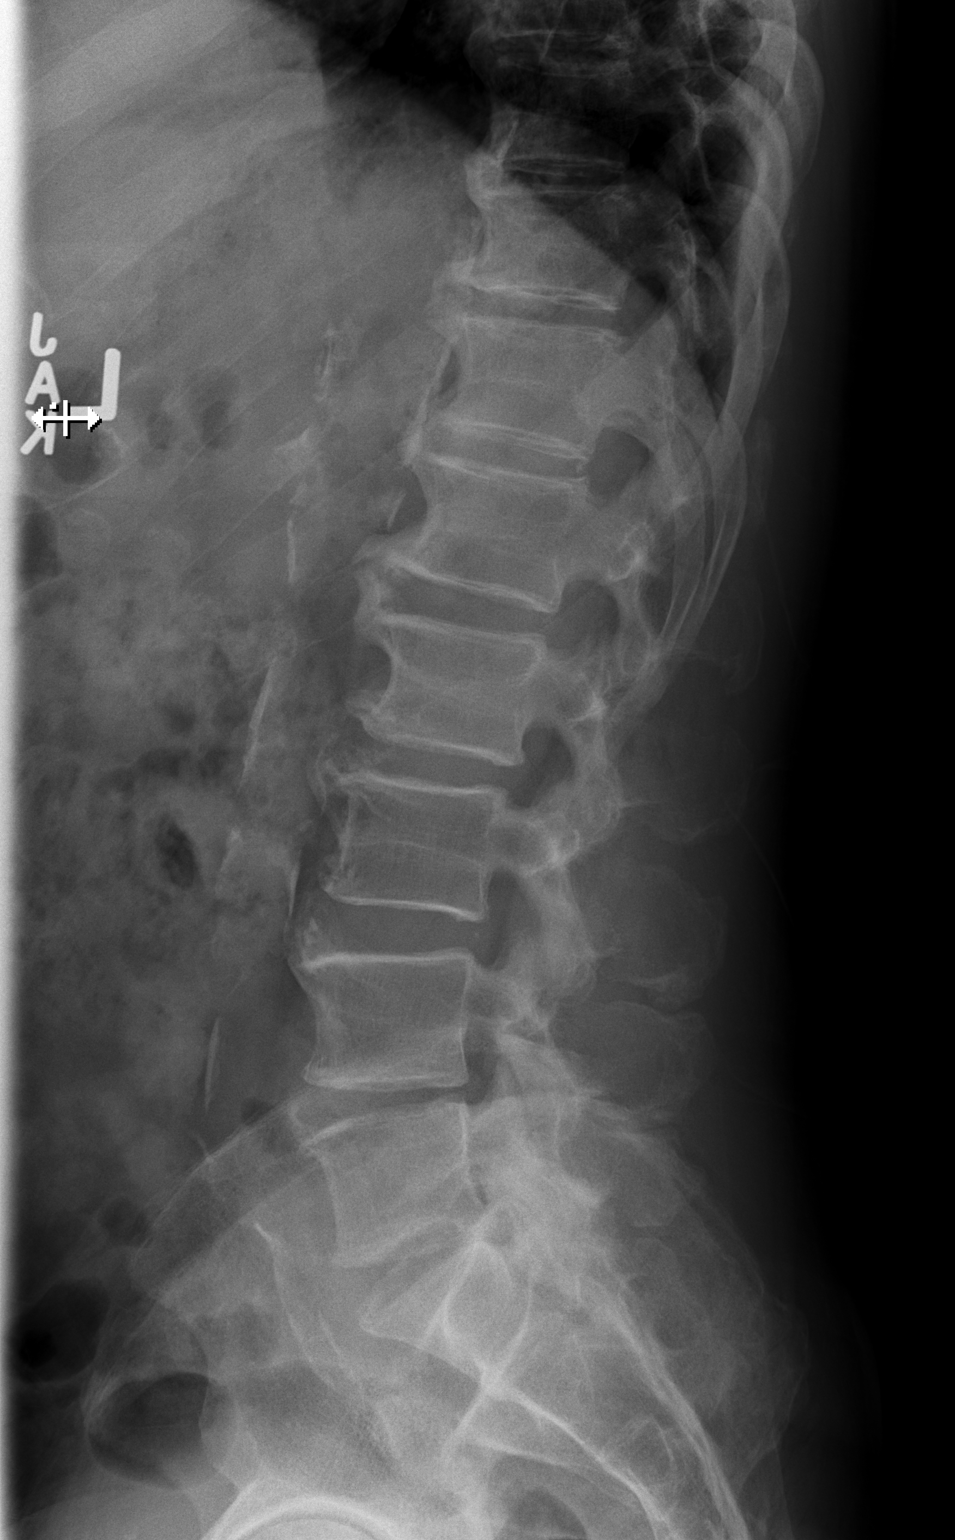

[t lumbar l-5 s-1 spot]
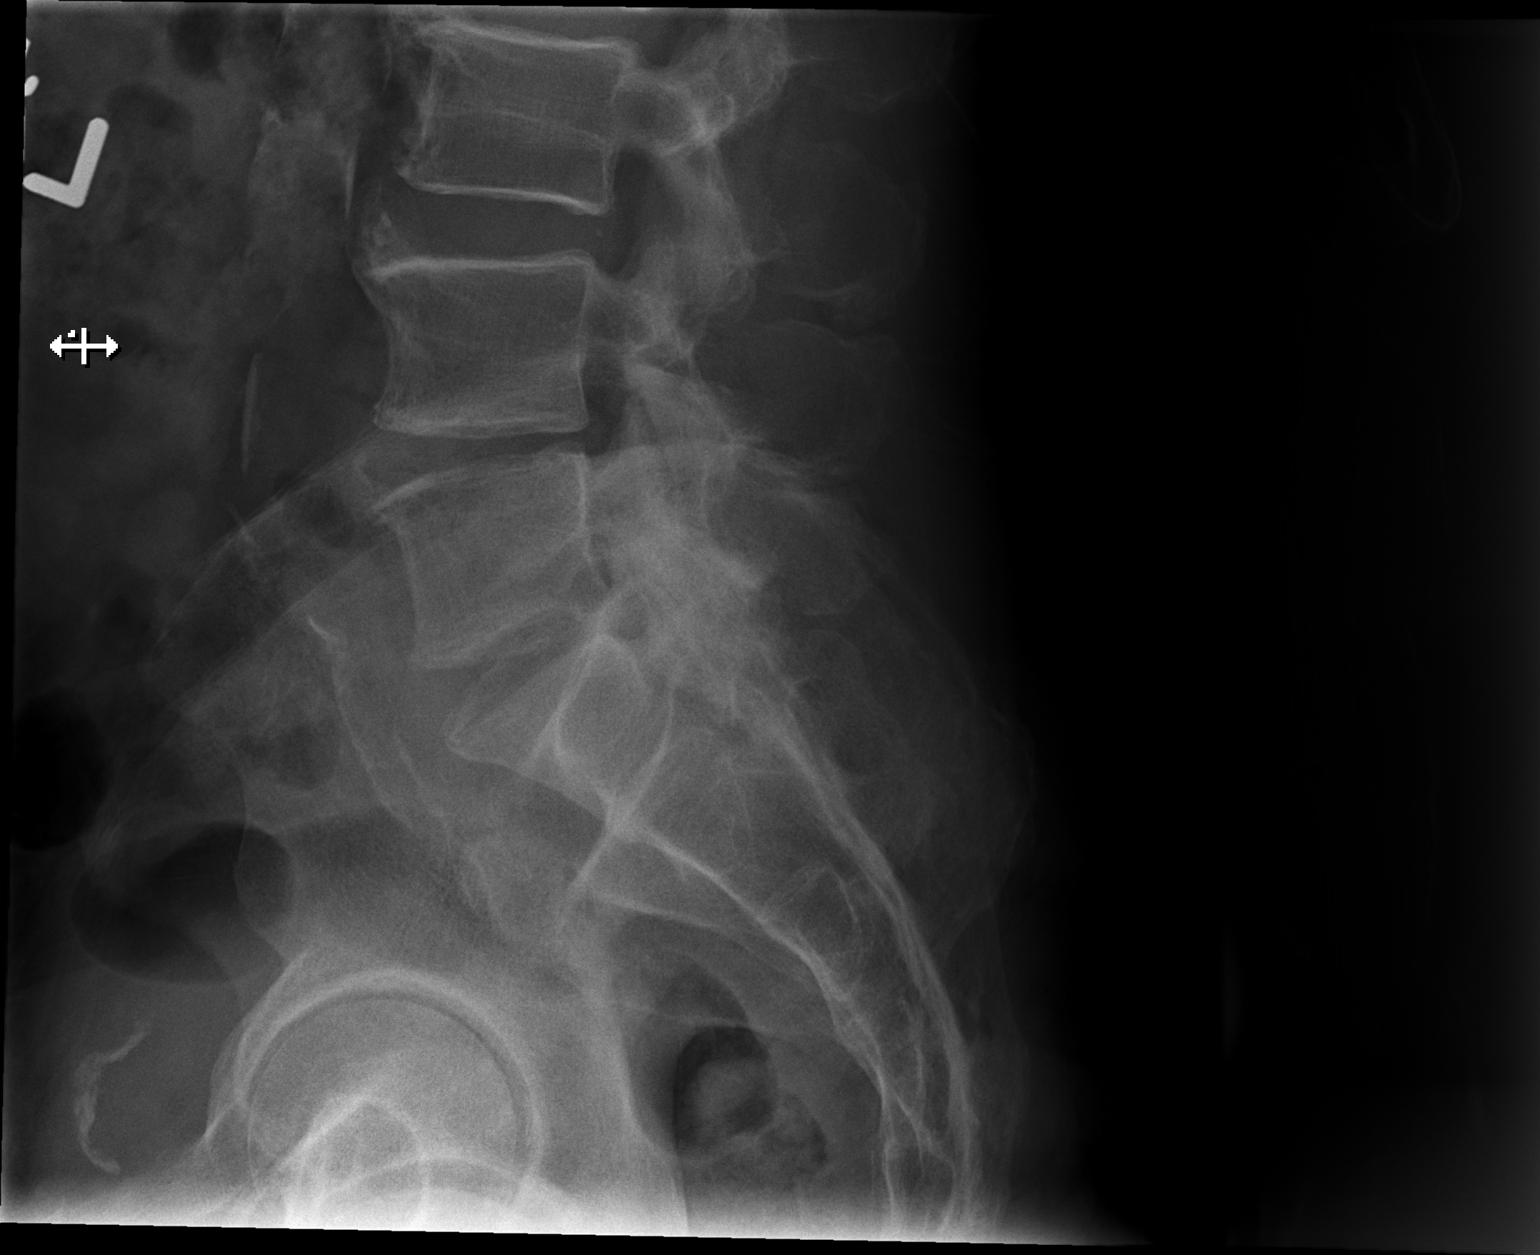

[3 of 3 positions shown; findings below may reference images not displayed]

FINDINGS: Vertebral body height and alignment are maintained. Mild loss of
disc space height and mild-to-moderate facet arthropathy are seen at
L4-5 and L5-S1. Scattered anterior endplate spurring is noted.
Paraspinous structures demonstrate atherosclerotic vascular disease.
IMPRESSION: No acute abnormality.

Mild appearing lumbar spondylosis L4-5 and L5-S1.

## 2020-09-17 IMAGING — CR DG HIP (WITH OR WITHOUT PELVIS) 2-3V LEFT
2 series · 2 of 2 positions shown · non-contrast
Comparison: None.

CLINICAL DATA: Low back pain which radiates to left posterior hip
pain extends to left ankle.

EXAM:
DG HIP (WITH OR WITHOUT PELVIS) 2-3V LEFT

[t hip frog left (1 of 2)]
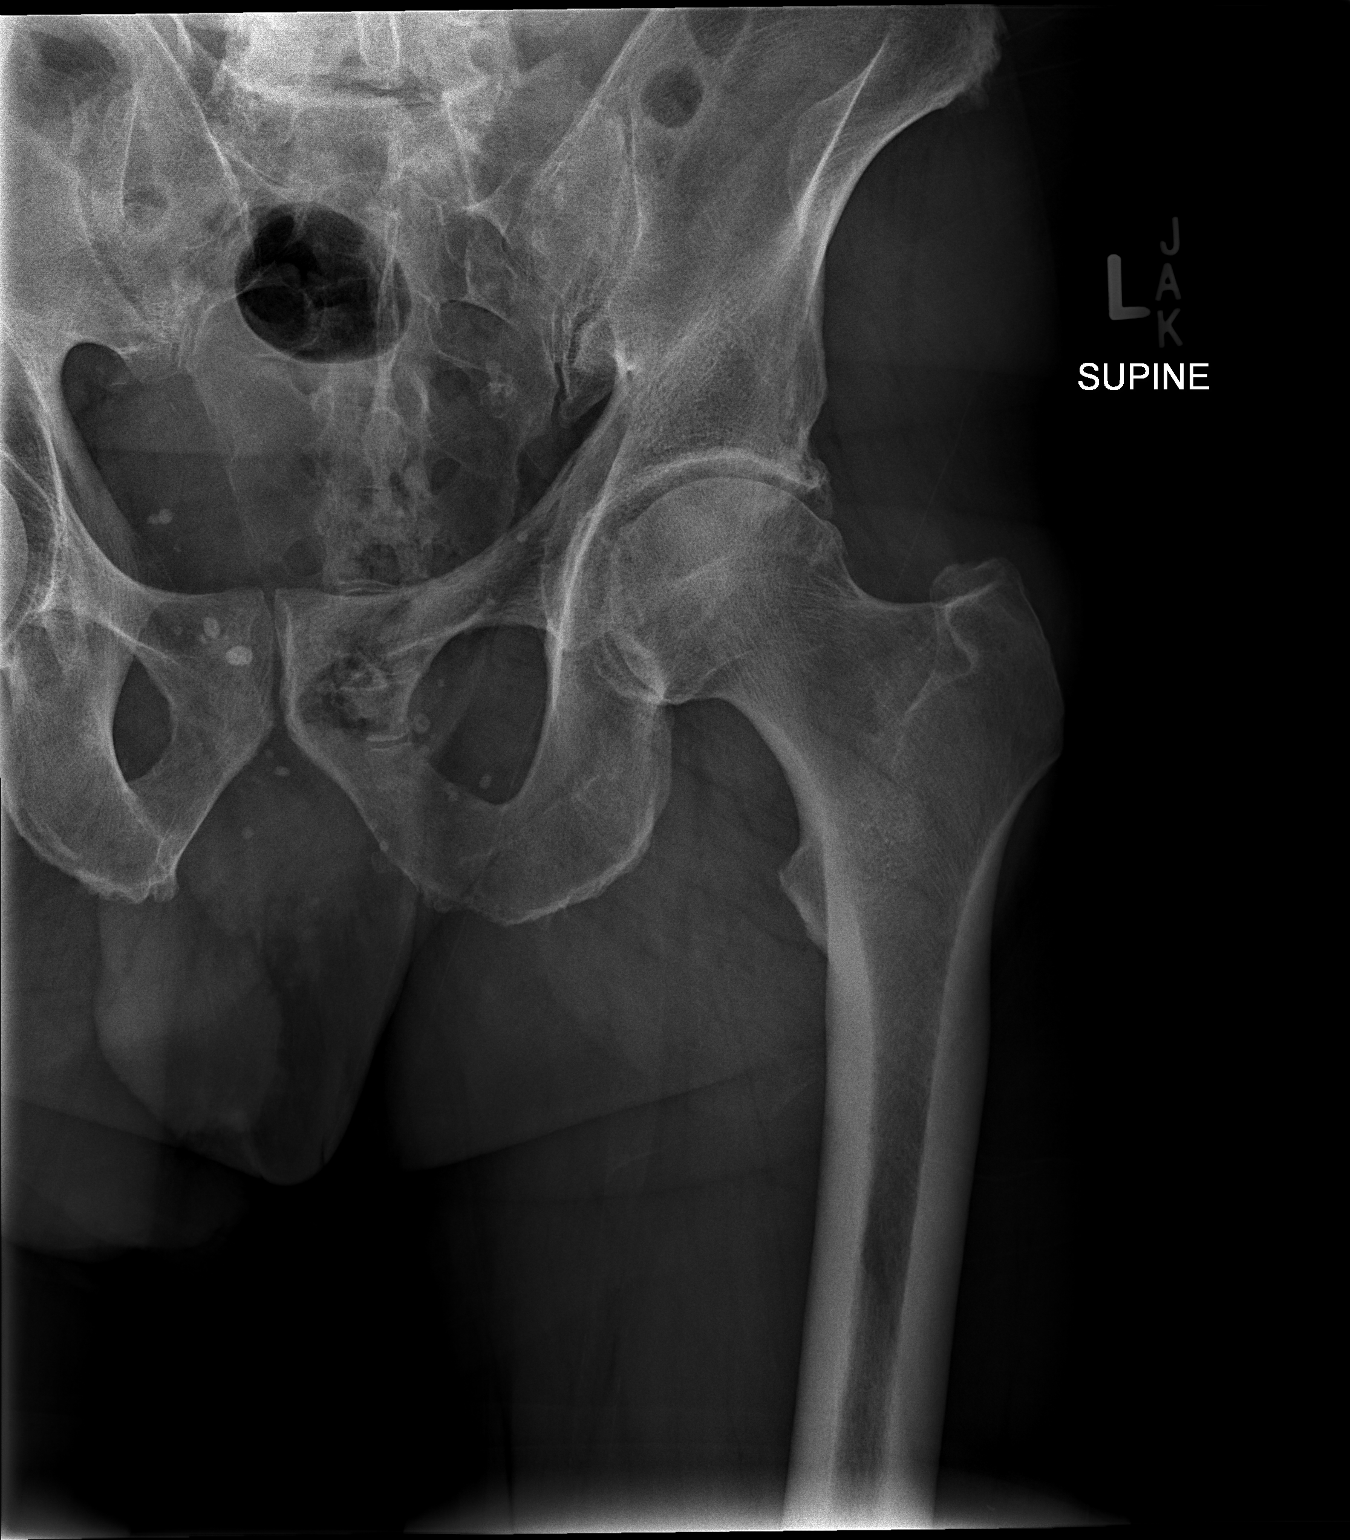

[t hip frog left (2 of 2)]
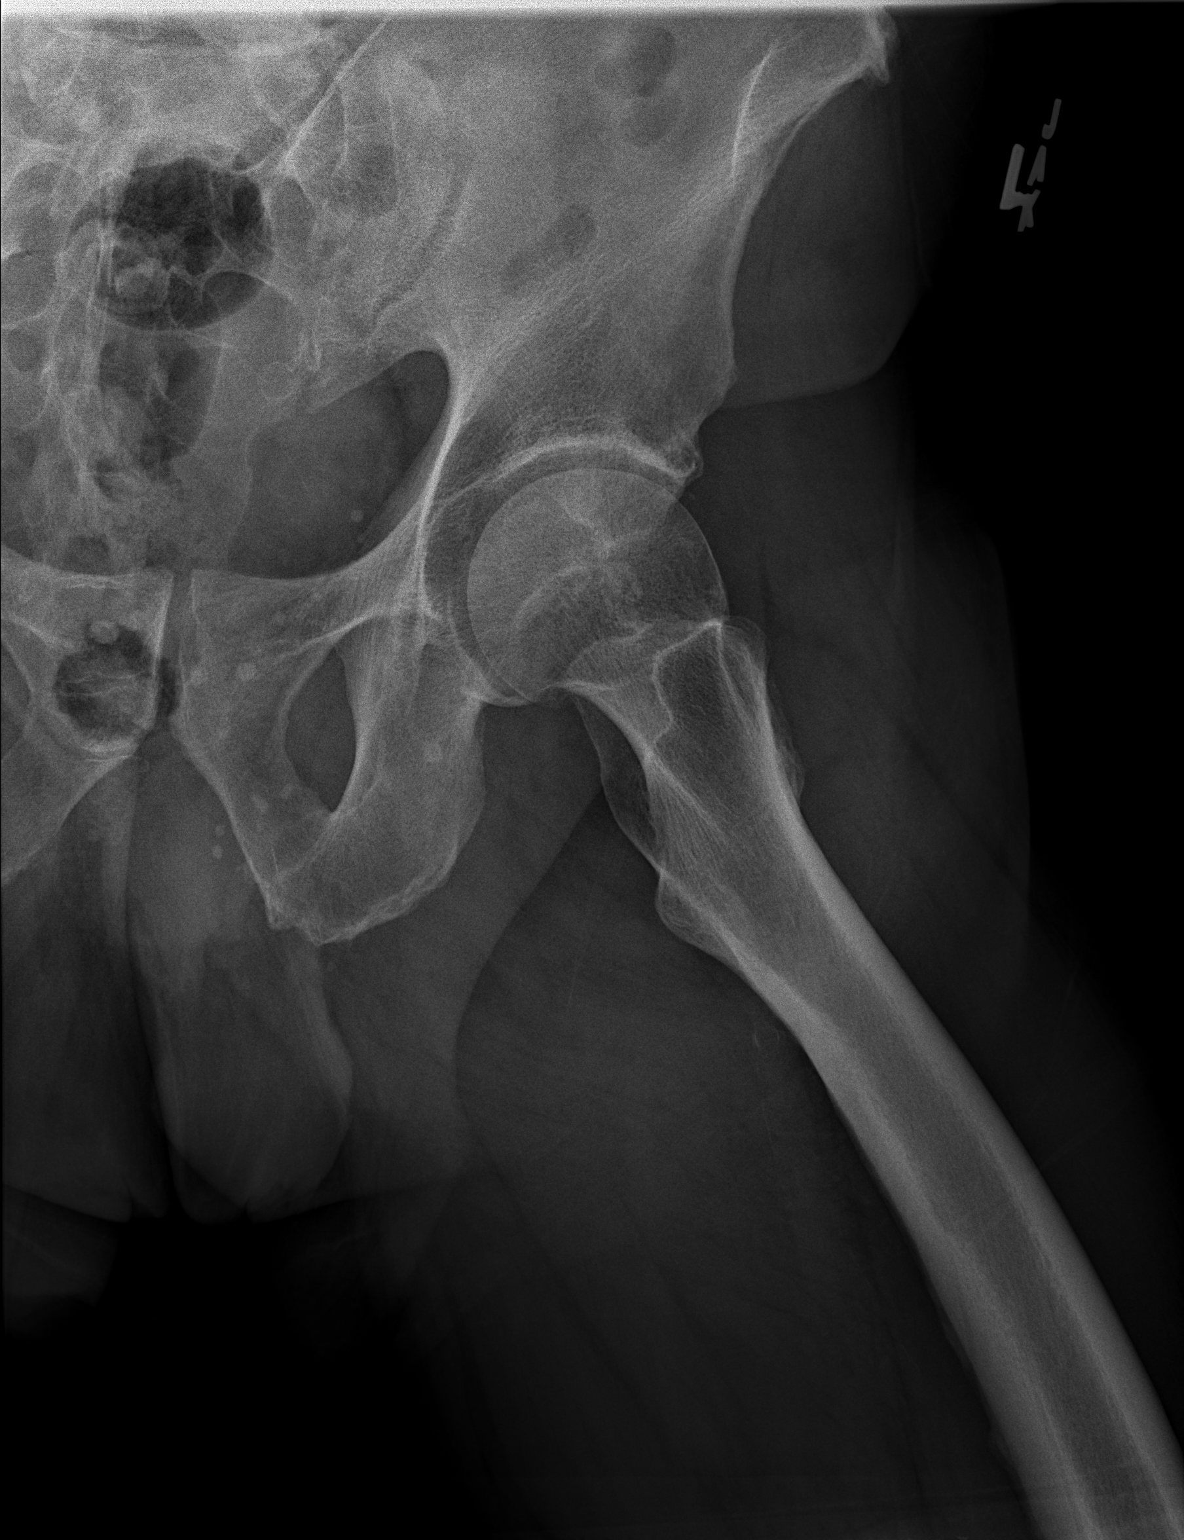

[2 of 2 positions shown; findings below may reference images not displayed]

FINDINGS: There are moderate degenerative changes involving the left hip.
There is joint space narrowing, subchondral sclerosis and marginal
spur formation. No fracture or dislocation identified.
IMPRESSION: 1. No acute findings.
2. Left hip osteoarthritis.

## 2020-09-30 ENCOUNTER — Telehealth: Payer: Self-pay | Admitting: Family Medicine

## 2020-09-30 NOTE — Telephone Encounter (Signed)
Left message for patient to call back and schedule Medicare Annual Wellness Visit (AWV) either virtually or in office.  Left both  my jabber number [phone] and office number    Last AWV 01/28/16  please schedule at anytime with [facility] Nurse Health Advisor 1 or 2   This should be a 45 minute visit.

## 2020-10-08 ENCOUNTER — Telehealth: Payer: Self-pay | Admitting: Family Medicine

## 2020-10-08 ENCOUNTER — Telehealth: Payer: Self-pay | Admitting: Cardiology

## 2020-10-08 ENCOUNTER — Encounter: Payer: Self-pay | Admitting: Family Medicine

## 2020-10-08 ENCOUNTER — Ambulatory Visit (INDEPENDENT_AMBULATORY_CARE_PROVIDER_SITE_OTHER): Payer: Medicare Other | Admitting: Family Medicine

## 2020-10-08 ENCOUNTER — Other Ambulatory Visit: Payer: Self-pay

## 2020-10-08 VITALS — BP 160/90 | HR 71 | Temp 98.3°F | Wt 160.2 lb

## 2020-10-08 DIAGNOSIS — I10 Essential (primary) hypertension: Secondary | ICD-10-CM

## 2020-10-08 DIAGNOSIS — G510 Bell's palsy: Secondary | ICD-10-CM

## 2020-10-08 DIAGNOSIS — I200 Unstable angina: Secondary | ICD-10-CM

## 2020-10-08 MED ORDER — VALACYCLOVIR HCL 1 G PO TABS: 1000.0000 mg | ORAL_TABLET | Freq: Three times a day (TID) | ORAL | 0 refills | Status: DC

## 2020-10-08 MED ORDER — PREDNISONE 20 MG PO TABS: ORAL_TABLET | ORAL | 0 refills | Status: DC

## 2020-10-08 NOTE — Telephone Encounter (Signed)
Spoke with the patient's wife she stated the prednisone is on the patients allergy list and he has sever reactions to this medication. She stated they did not pick up the prescription and left is at the pharmacy. She would like to know if there is any alternative recommendations.   Please advise.

## 2020-10-08 NOTE — Telephone Encounter (Signed)
Patient's spouse called on his behalf to let us know that he is allergic to prednisone and he was prescribed prednisone for his ear by Dr.Burchette. Patient needs a different prescription sent in. Thank you.     [id number] [place]  [phone] Fax:  [phone]     Good callback number is [phone]

## 2020-10-08 NOTE — Progress Notes (Signed)
Established Patient Office Visit  Subjective:  Patient ID: Terry Rivers, male    DOB: March 01, 1950  Age: 70 y.o. MRN: [id number]  CC:  Chief Complaint  Patient presents with   Ear Pain    Left side, pain radiates through the jaw and down the neck, pain also behind the left eye, x 1 week    HPI Terry Rivers presents for onset about a week ago of some occasional pain left side of face and down around the ear region.  He has noticed a bit of watering of the left eye.  No sinusitis symptoms.  He initially thought this was allergy related as he works outside a lot.  No chest pains.  He does have cardiac history.  No speech changes.  No extremity weakness.  No swallowing difficulties.  No facial rashes.  No ear drainage.  No visual changes.  He has watering of the left eye but no diplopia.  No facial skin rashes.  He has noted with eating he has had some more difficulties with the left side of the mouth.  He had difficulty with spitting from the left side.  Past Medical History:  Diagnosis Date   Arthritis    Benign essential HTN    Bradycardia    low 50"s per wife   CAD, multiple vessel-native arteries, emergent PCI RCA with DES 04/15/15 2017   a. 03/2014- inferior STEMI with occluded RCA; b. 04/2015: Staged PCI of LCx c. 07/13/18 PCI to [facility] for ISR   Chicken pox    Diabetes mellitus without complication ([place])    Borderline/no meds   Frequent headaches    GERD (gastroesophageal reflux disease)    Hyperlipidemia LDL goal <70 04/17/2015   Myocardial infarction ([place]) 03/2015   Tobacco use 04/17/2015   Unstable angina ([place]) 06/2018    Past Surgical History:  Procedure Laterality Date   CARDIAC CATHETERIZATION N/A 04/15/2015   Procedure: Left Heart Cath and Coronary Angiography;  Surgeon: Peter M [name], MD;  Location: [place] [facility];  Service: Cardiovascular;  Laterality: N/A;   CARDIAC CATHETERIZATION N/A 04/15/2015   Procedure: Coronary Stent Intervention;  Surgeon: Peter M [name], MD;   Location: [place] [facility];  Service: Cardiovascular;  Laterality: N/A;   CARDIAC CATHETERIZATION N/A 05/06/2015   Procedure: Coronary Stent Intervention;  Surgeon: Peter M [name], MD;  Location: [place] [facility];  Service: Cardiovascular;  Laterality: N/A;   CARDIAC CATHETERIZATION  05/06/2015   Procedure: Coronary/Graft Angiography;  Surgeon: Peter M [name], MD;  Location: [place] [facility];  Service: Cardiovascular;;   COLONOSCOPY     CORONARY BALLOON ANGIOPLASTY N/A 07/13/2018   Procedure: CORONARY BALLOON ANGIOPLASTY;  Surgeon: [name], MD;  Location: [place] [facility];  Service: Cardiovascular;  Laterality: N/A;   CORONARY BALLOON ANGIOPLASTY N/A 08/15/2020   Procedure: CORONARY BALLOON ANGIOPLASTY;  Surgeon: [name], Peter M, MD;  Location: [place] [facility];  Service: Cardiovascular;  Laterality: N/A;   CORONARY STENT INTERVENTION N/A 07/13/2018   Procedure: CORONARY STENT INTERVENTION;  Surgeon: [name], MD;  Location: [place] [facility];  Service: Cardiovascular;  Laterality: N/A;   CORONARY STENT PLACEMENT  05/06/2015   LT CIRC DES X2   LEFT HEART CATH AND CORONARY ANGIOGRAPHY N/A 07/13/2018   Procedure: LEFT HEART CATH AND CORONARY ANGIOGRAPHY;  Surgeon: [name], MD;  Location: [place] [facility];  Service: Cardiovascular;  Laterality: N/A;   LEFT HEART CATH AND CORONARY ANGIOGRAPHY N/A  08/15/2020   Procedure: LEFT HEART CATH AND CORONARY ANGIOGRAPHY;  Surgeon: [name], Peter M, MD;  Location: [place] [facility];  Service: Cardiovascular;  Laterality: N/A;   POLYPECTOMY     SKIN CANCER EXCISION     right forearm/pre-cancerous/ 2 spots on left arm    Family History  Problem Relation Age of Onset   Stroke Mother    Cancer Father        colon, prostate   Colon cancer Father    Cancer Brother        lung   Colon cancer Brother    Kidney cancer Brother    Kidney cancer Sister    Brain cancer Sister    Kidney cancer Sister    Kidney cancer Sister     Kidney cancer Sister     Social History   Socioeconomic History   Marital status: Married    Spouse name: Not on file   Number of children: Not on file   Years of education: Not on file   Highest education level: Not on file  Occupational History   Not on file  Tobacco Use   Smoking status: Never   Smokeless tobacco: Current    Types: Chew   Tobacco comments:    chew every day  Vaping Use   Vaping Use: Never used  Substance and Sexual Activity   Alcohol use: No    Alcohol/week: 0.0 standard drinks   Drug use: No   Sexual activity: Not on file  Other Topics Concern   Not on file  Social History Narrative   Not on file   Social Determinants of Health   Financial Resource Strain: Not on file  Food Insecurity: Not on file  Transportation Needs: Not on file  Physical Activity: Not on file  Stress: Not on file  Social Connections: Not on file  Intimate Partner Violence: Not on file    Outpatient Medications Prior to Visit  Medication Sig Dispense Refill   acetaminophen (TYLENOL) 500 MG tablet Take 500-1,000 mg by mouth every 6 (six) hours as needed for mild pain or headache.     amLODipine (NORVASC) 5 MG tablet TAKE 1 TABLET BY MOUTH DAILY 90 tablet 3   aspirin 81 MG chewable tablet Chew 1 tablet (81 mg total) by mouth daily.     atorvastatin (LIPITOR) 80 MG tablet TAKE 1 TABLET (80 MG TOTAL) BY MOUTH DAILY AT 6 PM. 90 tablet 2   clopidogrel (PLAVIX) 75 MG tablet TAKE 1 TABLET (75 MG TOTAL) BY MOUTH DAILY WITH BREAKFAST. 90 tablet 3   lisinopril (ZESTRIL) 40 MG tablet Take 1 tablet (40 mg total) by mouth daily. 90 tablet 3   nitroGLYCERIN (NITROSTAT) 0.4 MG SL tablet Place 1 tablet (0.4 mg total) under the tongue every 5 (five) minutes x 3 doses as needed for chest pain. 25 tablet 3   omega-3 acid ethyl esters (LOVAZA) 1 g capsule Take 2 capsules (2 g total) by mouth 2 (two) times daily. 120 capsule 11   pantoprazole (PROTONIX) 40 MG tablet TAKE 1 TABLET BY MOUTH  DAILY BEFORE BREAKFAST (Patient taking differently: Take 40 mg by mouth daily.) 90 tablet 3   Facility-Administered Medications Prior to Visit  Medication Dose Route Frequency Provider Last Rate Last Admin   sodium chloride flush (NS) 0.9 % injection 3 mL  3 mL Intravenous Q12H [name], Peter M, MD        Allergies  Allergen Reactions   Penicillins Anaphylaxis, Shortness Of  Breath and Swelling    Joints became swollen /turned purple Did it involve swelling of the face/tongue/throat, SOB, or low BP? No Did it involve sudden or severe rash/hives, skin peeling, or any reaction on the inside of your mouth or nose? No Did you need to seek medical attention at a hospital or doctor's office? Yes When did it last happen? "Many years ago"    If all above answers are "NO", may proceed with cephalosporin use.     Prednisone Swelling and Other (See Comments)    Made the patient's joints swell to the point where he could not walk    ROS Review of Systems  Constitutional:  Negative for chills and fever.  HENT:  Negative for congestion.   Eyes:  Negative for visual disturbance.  Respiratory:  Negative for shortness of breath.   Cardiovascular:  Negative for chest pain.  Neurological:  Positive for facial asymmetry. Negative for seizures, syncope, speech difficulty and headaches.  Psychiatric/Behavioral:  Negative for confusion.      Objective:    Physical Exam Vitals reviewed.  Cardiovascular:     Rate and Rhythm: Normal rate and regular rhythm.  Pulmonary:     Effort: Pulmonary effort is normal.     Breath sounds: Normal breath sounds.  Musculoskeletal:     Cervical back: Neck supple.     Right lower leg: No edema.     Left lower leg: No edema.  Skin:    Findings: No rash.  Neurological:     Mental Status: He is alert and oriented to person, place, and time.     Comments: He has some obvious left facial nerve involvement.  He has some mild weakness left side of mouth.  He is able  to fully close the left eyelid but does have some watering.  Does have some weakness with left lid closure.  He has weakness involving the left forehead compared to the right.  Full strength upper and lower extremities.    BP (!) 160/90 (BP Location: Left Arm, Patient Position: Sitting, Cuff Size: Normal)   Pulse 71   Temp 98.3 F (36.8 C) (Oral)   Wt 160 lb 3.2 oz (72.7 kg)   SpO2 99%   BMI 26.66 kg/m  Wt Readings from Last 3 Encounters:  10/08/20 160 lb 3.2 oz (72.7 kg)  08/29/20 157 lb (71.2 kg)  08/15/20 158 lb (71.7 kg)     Health Maintenance Due  Topic Date Due   COVID-19 Vaccine (1) Never done   Zoster Vaccines- Shingrix (1 of 2) Never done   INFLUENZA VACCINE  09/15/2020    There are no preventive care reminders to display for this patient.  Lab Results  Component Value Date   TSH 3.419 04/15/2015   Lab Results  Component Value Date   WBC 6.8 08/08/2020   HGB 15.2 08/08/2020   HCT 44.5 08/08/2020   MCV 92 08/08/2020   PLT 244 08/08/2020   Lab Results  Component Value Date   NA 140 08/08/2020   K 4.3 08/08/2020   CO2 20 08/08/2020   GLUCOSE 103 (H) 08/08/2020   BUN 11 08/08/2020   CREATININE 0.99 08/08/2020   BILITOT 0.6 04/21/2020   ALKPHOS 47 04/21/2020   AST 22 04/21/2020   ALT 29 04/21/2020   PROT 7.3 04/21/2020   ALBUMIN 4.2 04/21/2020   CALCIUM 9.3 08/08/2020   ANIONGAP 8 07/14/2018   EGFR 81 08/08/2020   GFR 74.53 04/21/2020   Lab Results  Component Value Date   CHOL 109 04/21/2020   Lab Results  Component Value Date   HDL 21.30 (L) 04/21/2020   Lab Results  Component Value Date   LDLCALC 37 05/10/2019   Lab Results  Component Value Date   TRIG 374.0 (H) 04/21/2020   Lab Results  Component Value Date   CHOLHDL 5 04/21/2020   Lab Results  Component Value Date   HGBA1C 5.8 (H) 04/16/2015      Assessment & Plan:    #1 patient has acute Bell's palsy involving left side of face. Explained that prognosis is uncertain  but that these usually resolve over time.  We have recommend starting prednisone 60 mg daily.  We reviewed potential side effects.  We also reviewed the fact this could exacerbate blood sugars though he does not have any history of diabetes.  Will take for 1 week.  Also start Valtrex 1 g 3 times daily for 7 days  He does have other systemic issues including some eye watering and fatigue.  No evidence for CVA.  -We discussed eye protection of the left eye -Recommend 2-week follow-up. -Follow-up immediately for any new symptoms or worsening symptoms.  #2 hypertension.  Blood pressure up today.  This has been atypical for him-to be at this high.  He is treated with amlodipine and lisinopril..  We will reassess at follow-up.  Address at that time still elevated.  Meds ordered this encounter  Medications   valACYclovir (VALTREX) 1000 MG tablet    Sig: Take 1 tablet (1,000 mg total) by mouth 3 (three) times daily.    Dispense:  21 tablet    Refill:  0   predniSONE (DELTASONE) 20 MG tablet    Sig: Take three tablets by mouth (60 mg ) once daily for 7 days.    Dispense:  21 tablet    Refill:  0    Follow-up: Return in about 2 weeks (around 10/22/2020).    Carolann Littler, MD

## 2020-10-08 NOTE — Telephone Encounter (Signed)
New message:      Patient would like for the doctor to take a look at his chart. Patient had a appt with his PCP today.

## 2020-10-08 NOTE — Telephone Encounter (Signed)
Pt's wife calling today because she wanted to let Dr. [name] know pt was diagnosed with Bell's Palsy today. She had no additional needs at this time.

## 2020-10-08 NOTE — Telephone Encounter (Signed)
Spoke with the patient's wife. She is aware of Dr. Erick Blinks message. She stated that he did not have fluid retention but all his joints became very stiff. He was unable to move and had to be hospitalized and it took them a few weeks to figure out that the issue was coming from the prednisone. She stated that he will not be taking the prednisone.  Will send to Dr. Elease Hashimoto as an Juluis Rainier

## 2020-10-21 ENCOUNTER — Other Ambulatory Visit: Payer: Self-pay

## 2020-10-22 ENCOUNTER — Ambulatory Visit (INDEPENDENT_AMBULATORY_CARE_PROVIDER_SITE_OTHER): Payer: Medicare Other | Admitting: Family Medicine

## 2020-10-22 VITALS — BP 128/64 | HR 70 | Temp 97.9°F | Wt 154.1 lb

## 2020-10-22 DIAGNOSIS — Z23 Encounter for immunization: Secondary | ICD-10-CM | POA: Diagnosis not present

## 2020-10-22 DIAGNOSIS — I200 Unstable angina: Secondary | ICD-10-CM | POA: Diagnosis not present

## 2020-10-22 DIAGNOSIS — G510 Bell's palsy: Secondary | ICD-10-CM

## 2020-10-22 NOTE — Progress Notes (Signed)
Established Patient Office Visit  Subjective:  Patient ID: Terry Rivers, male    DOB: Jun 29, 1950  Age: 70 y.o. MRN: [id number]  CC:  Chief Complaint  Patient presents with   Follow-up    Was able to finish prednisone, feels much better, no further issues.     HPI Terry Rivers presents for follow-up regarding his Bell's palsy.  Refer to note from 10-08-2020 for details. He had noted onset about a week prior to being seen here with some occasional pain left side of face but no rash.  He noticed some watering of the left eye and initially thought this was allergy type symptoms.  No speech changes no extremity weakness.  He had some obvious left facial weakness consistent with Bell's palsy with motor weakness left forehead as well.  There have been some question of prior intolerance with prednisone but he was able to take course of prednisone for [redacted] week along with Valtrex and symptoms greatly improved at this time and almost resolved.  He is able to fully close the left eye without difficulty at this time with no watering or tearing.  Much less facial droop.  Blood pressure had been up last visit and he remains on amlodipine and lisinopril.  Much improved today with repeat after rest left arm seated 128/64  Past Medical History:  Diagnosis Date   Arthritis    Benign essential HTN    Bradycardia    low 50"s per wife   CAD, multiple vessel-native arteries, emergent PCI RCA with DES 04/15/15 2017   a. 03/2014- inferior STEMI with occluded RCA; b. 04/2015: Staged PCI of LCx c. 07/13/18 PCI to [facility] for ISR   Chicken pox    Diabetes mellitus without complication ([place])    Borderline/no meds   Frequent headaches    GERD (gastroesophageal reflux disease)    Hyperlipidemia LDL goal <70 04/17/2015   Myocardial infarction ([place]) 03/2015   Tobacco use 04/17/2015   Unstable angina ([place]) 06/2018    Past Surgical History:  Procedure Laterality Date   CARDIAC CATHETERIZATION N/A 04/15/2015   Procedure:  Left Heart Cath and Coronary Angiography;  Surgeon: Peter M [name], MD;  Location: [place] [facility];  Service: Cardiovascular;  Laterality: N/A;   CARDIAC CATHETERIZATION N/A 04/15/2015   Procedure: Coronary Stent Intervention;  Surgeon: Peter M [name], MD;  Location: [place] [facility];  Service: Cardiovascular;  Laterality: N/A;   CARDIAC CATHETERIZATION N/A 05/06/2015   Procedure: Coronary Stent Intervention;  Surgeon: Peter M [name], MD;  Location: [place] [facility];  Service: Cardiovascular;  Laterality: N/A;   CARDIAC CATHETERIZATION  05/06/2015   Procedure: Coronary/Graft Angiography;  Surgeon: Peter M [name], MD;  Location: [place] [facility];  Service: Cardiovascular;;   COLONOSCOPY     CORONARY BALLOON ANGIOPLASTY N/A 07/13/2018   Procedure: CORONARY BALLOON ANGIOPLASTY;  Surgeon: [name], MD;  Location: [place] [facility];  Service: Cardiovascular;  Laterality: N/A;   CORONARY BALLOON ANGIOPLASTY N/A 08/15/2020   Procedure: CORONARY BALLOON ANGIOPLASTY;  Surgeon: [name], Peter M, MD;  Location: [place] [facility];  Service: Cardiovascular;  Laterality: N/A;   CORONARY STENT INTERVENTION N/A 07/13/2018   Procedure: CORONARY STENT INTERVENTION;  Surgeon: [name], MD;  Location: [place] [facility];  Service: Cardiovascular;  Laterality: N/A;   CORONARY STENT PLACEMENT  05/06/2015   LT CIRC DES X2   LEFT HEART CATH AND CORONARY ANGIOGRAPHY N/A 07/13/2018   Procedure: LEFT HEART CATH  AND CORONARY ANGIOGRAPHY;  Surgeon: [name], MD;  Location: [place] [facility];  Service: Cardiovascular;  Laterality: N/A;   LEFT HEART CATH AND CORONARY ANGIOGRAPHY N/A 08/15/2020   Procedure: LEFT HEART CATH AND CORONARY ANGIOGRAPHY;  Surgeon: [name], Peter M, MD;  Location: [place] [facility];  Service: Cardiovascular;  Laterality: N/A;   POLYPECTOMY     SKIN CANCER EXCISION     right forearm/pre-cancerous/ 2 spots on left arm    Family History  Problem Relation Age of Onset    Stroke Mother    Cancer Father        colon, prostate   Colon cancer Father    Cancer Brother        lung   Colon cancer Brother    Kidney cancer Brother    Kidney cancer Sister    Brain cancer Sister    Kidney cancer Sister    Kidney cancer Sister    Kidney cancer Sister     Social History   Socioeconomic History   Marital status: Married    Spouse name: Not on file   Number of children: Not on file   Years of education: Not on file   Highest education level: Not on file  Occupational History   Not on file  Tobacco Use   Smoking status: Never   Smokeless tobacco: Current    Types: Chew   Tobacco comments:    chew every day  Vaping Use   Vaping Use: Never used  Substance and Sexual Activity   Alcohol use: No    Alcohol/week: 0.0 standard drinks   Drug use: No   Sexual activity: Not on file  Other Topics Concern   Not on file  Social History Narrative   Not on file   Social Determinants of Health   Financial Resource Strain: Not on file  Food Insecurity: Not on file  Transportation Needs: Not on file  Physical Activity: Not on file  Stress: Not on file  Social Connections: Not on file  Intimate Partner Violence: Not on file    Outpatient Medications Prior to Visit  Medication Sig Dispense Refill   acetaminophen (TYLENOL) 500 MG tablet Take 500-1,000 mg by mouth every 6 (six) hours as needed for mild pain or headache.     amLODipine (NORVASC) 5 MG tablet TAKE 1 TABLET BY MOUTH DAILY 90 tablet 3   aspirin 81 MG chewable tablet Chew 1 tablet (81 mg total) by mouth daily.     atorvastatin (LIPITOR) 80 MG tablet TAKE 1 TABLET (80 MG TOTAL) BY MOUTH DAILY AT 6 PM. 90 tablet 2   clopidogrel (PLAVIX) 75 MG tablet TAKE 1 TABLET (75 MG TOTAL) BY MOUTH DAILY WITH BREAKFAST. 90 tablet 3   lisinopril (ZESTRIL) 40 MG tablet Take 1 tablet (40 mg total) by mouth daily. 90 tablet 3   nitroGLYCERIN (NITROSTAT) 0.4 MG SL tablet Place 1 tablet (0.4 mg total) under the tongue  every 5 (five) minutes x 3 doses as needed for chest pain. 25 tablet 3   omega-3 acid ethyl esters (LOVAZA) 1 g capsule Take 2 capsules (2 g total) by mouth 2 (two) times daily. 120 capsule 11   pantoprazole (PROTONIX) 40 MG tablet TAKE 1 TABLET BY MOUTH DAILY BEFORE BREAKFAST (Patient taking differently: Take 40 mg by mouth daily.) 90 tablet 3   predniSONE (DELTASONE) 20 MG tablet Take three tablets by mouth (60 mg ) once daily for 7 days. 21 tablet 0  valACYclovir (VALTREX) 1000 MG tablet Take 1 tablet (1,000 mg total) by mouth 3 (three) times daily. 21 tablet 0   Facility-Administered Medications Prior to Visit  Medication Dose Route Frequency Provider Last Rate Last Admin   sodium chloride flush (NS) 0.9 % injection 3 mL  3 mL Intravenous Q12H [name], Peter M, MD        Allergies  Allergen Reactions   Penicillins Anaphylaxis, Shortness Of Breath and Swelling    Joints became swollen /turned purple Did it involve swelling of the face/tongue/throat, SOB, or low BP? No Did it involve sudden or severe rash/hives, skin peeling, or any reaction on the inside of your mouth or nose? No Did you need to seek medical attention at a hospital or doctor's office? Yes When did it last happen? "Many years ago"    If all above answers are "NO", may proceed with cephalosporin use.     Prednisone Swelling and Other (See Comments)    Made the patient's joints swell to the point where he could not walk    ROS Review of Systems  Constitutional:  Negative for fatigue.  Eyes:  Negative for visual disturbance.  Respiratory:  Negative for cough, chest tightness and shortness of breath.   Cardiovascular:  Negative for chest pain, palpitations and leg swelling.  Neurological:  Negative for dizziness, syncope, speech difficulty, light-headedness and headaches.     Objective:    Physical Exam Constitutional:      Appearance: He is well-developed.  HENT:     Right Ear: External ear normal.     Left  Ear: External ear normal.  Eyes:     Pupils: Pupils are equal, round, and reactive to light.  Neck:     Thyroid: No thyromegaly.  Cardiovascular:     Rate and Rhythm: Normal rate and regular rhythm.  Pulmonary:     Effort: Pulmonary effort is normal. No respiratory distress.     Breath sounds: Normal breath sounds. No wheezing or rales.  Musculoskeletal:     Cervical back: Neck supple.  Neurological:     Mental Status: He is alert and oriented to person, place, and time.     Comments: Still has some very subtle weakness left corner of the mouth and very mild weakness with closure of the left eyelid but overall greatly improved.    BP 128/64 (BP Location: Left Arm, Cuff Size: Normal)   Pulse 70   Temp 97.9 F (36.6 C) (Oral)   Wt 154 lb 1.6 oz (69.9 kg)   SpO2 96%   BMI 25.64 kg/m  Wt Readings from Last 3 Encounters:  10/22/20 154 lb 1.6 oz (69.9 kg)  10/08/20 160 lb 3.2 oz (72.7 kg)  08/29/20 157 lb (71.2 kg)     Health Maintenance Due  Topic Date Due   COVID-19 Vaccine (1) Never done   Zoster Vaccines- Shingrix (1 of 2) Never done   INFLUENZA VACCINE  09/15/2020    There are no preventive care reminders to display for this patient.  Lab Results  Component Value Date   TSH 3.419 04/15/2015   Lab Results  Component Value Date   WBC 6.8 08/08/2020   HGB 15.2 08/08/2020   HCT 44.5 08/08/2020   MCV 92 08/08/2020   PLT 244 08/08/2020   Lab Results  Component Value Date   NA 140 08/08/2020   K 4.3 08/08/2020   CO2 20 08/08/2020   GLUCOSE 103 (H) 08/08/2020   BUN 11 08/08/2020  CREATININE 0.99 08/08/2020   BILITOT 0.6 04/21/2020   ALKPHOS 47 04/21/2020   AST 22 04/21/2020   ALT 29 04/21/2020   PROT 7.3 04/21/2020   ALBUMIN 4.2 04/21/2020   CALCIUM 9.3 08/08/2020   ANIONGAP 8 07/14/2018   EGFR 81 08/08/2020   GFR 74.53 04/21/2020   Lab Results  Component Value Date   CHOL 109 04/21/2020   Lab Results  Component Value Date   HDL 21.30 (L)  04/21/2020   Lab Results  Component Value Date   LDLCALC 37 05/10/2019   Lab Results  Component Value Date   TRIG 374.0 (H) 04/21/2020   Lab Results  Component Value Date   CHOLHDL 5 04/21/2020   Lab Results  Component Value Date   HGBA1C 5.8 (H) 04/16/2015      Assessment & Plan:   Problem List Items Addressed This Visit   None Visit Diagnoses     Bell's palsy    -  Primary     Improving following recent course with Valtrex and prednisone.  Patient has almost total resolution of left facial weakness. -Reassurance given and follow-up for any recurrent weakness or other concerns -Flu vaccine recommended and patient consented and this was given today.  No orders of the defined types were placed in this encounter.   Follow-up: No follow-ups on file.    Carolann Littler, MD

## 2020-11-11 ENCOUNTER — Other Ambulatory Visit: Payer: Self-pay | Admitting: Cardiology

## 2020-11-13 ENCOUNTER — Other Ambulatory Visit: Payer: Self-pay | Admitting: Cardiology

## 2020-11-21 DIAGNOSIS — Z006 Encounter for examination for normal comparison and control in clinical research program: Secondary | ICD-10-CM

## 2020-11-21 NOTE — Research (Signed)
Patient is wanting to talk with MD before participating. Will call back

## 2020-11-21 NOTE — Progress Notes (Signed)
Cardiology Office Note:    Date:  11/24/2020   ID:  Terry Rivers, DOB 07-10-1950, MRN [id number]  PCP:  Terry Post, MD  Cardiologist:  Dr. [name]    Chief Complaint  Patient presents with   Coronary Artery Disease    History of Present Illness:     Terry Rivers is a 70 y.o. male with a hx of CAD, bradycardia, DM (diet controlled), DJD who is seen as a work in for evaluation of chest pain.   Admitted 2/28-04/17/15 with inferior STEMI.  LHC demonstrated multivessel CAD with occluded RCA as the culprit. RCA was treated with DES.  He has residual LAD/Dx and LCx disease. He was not started on beta-blocker due to bradycardia. On 05/06/15 he underwent staged PCI of the proximal to mid LCx with a DES. The mid LAD had a 60% stenosis with FFR of 0.9 and was  treated medically.  Has done very well until March 2020 when he began experiencing intermittent exertional chest pain and dyspnea.  He was admitted in May and had repeat cardiac cath showing restenosis in the RCA treated with DES. Stents in the  LCx still patent.  LAD disease unchanged. For BP control his lisinopril dose was increased and amlodipine added.   He was seen on 08/05/20 with  increasing episodes of angina.   On 08/15/2020 underwent cardiac catheterization showing single-vessel obstructive CAD with in-stent restenosis focal in the proximal RCA.  Of note lesion has been stented twice before with  stent underexpansion.  He underwent partially successful PCI of proximal RCA with Shock wave therapy and aggressive expansion with oversized 4.5 [place] balloon with lesion reduced to 50% but stent still not fully expanded.  He was recommended for continued indefinite DAPT.  RCA lesion noted to be high risk for restenosis and if he has refractory angina despite medical therapy in the future may need to be considered for CABG. When seen last in July he was doing well.   He is doing well today. Denies any chest pain. Notes some SOB if he walks  400-500 feet. No edema or palpitations. He is active walking.    Past Medical History:  Diagnosis Date   Arthritis    Benign essential HTN    Bradycardia    low 50"s per wife   CAD, multiple vessel-native arteries, emergent PCI RCA with DES 04/15/15 2017   a. 03/2014- inferior STEMI with occluded RCA; b. 04/2015: Staged PCI of LCx c. 07/13/18 PCI to [name] for ISR   Chicken pox    Diabetes mellitus without complication ([name])    Borderline/no meds   Frequent headaches    GERD (gastroesophageal reflux disease)    Hyperlipidemia LDL goal <70 04/17/2015   Myocardial infarction ([place]) 03/2015   Tobacco use 04/17/2015   Unstable angina ([name]) 06/2018    Past Surgical History:  Procedure Laterality Date   CARDIAC CATHETERIZATION N/A 04/15/2015   Procedure: Left Heart Cath and Coronary Angiography;  Surgeon: [name], MD;  Location: [place] [facility];  Service: Cardiovascular;  Laterality: N/A;   CARDIAC CATHETERIZATION N/A 04/15/2015   Procedure: Coronary Stent Intervention;  Surgeon: [name], MD;  Location: [place] [facility];  Service: Cardiovascular;  Laterality: N/A;   CARDIAC CATHETERIZATION N/A 05/06/2015   Procedure: Coronary Stent Intervention;  Surgeon: [name], MD;  Location: [place] [facility];  Service: Cardiovascular;  Laterality: N/A;   CARDIAC CATHETERIZATION  05/06/2015   Procedure: Coronary/Graft Angiography;  Surgeon: [name], MD;  Location: [place] [facility];  Service: Cardiovascular;;   COLONOSCOPY     CORONARY BALLOON ANGIOPLASTY N/A 07/13/2018   Procedure: CORONARY BALLOON ANGIOPLASTY;  Surgeon: [name], MD;  Location: [place] [facility];  Service: Cardiovascular;  Laterality: N/A;   CORONARY BALLOON ANGIOPLASTY N/A 08/15/2020   Procedure: CORONARY BALLOON ANGIOPLASTY;  Surgeon: [name], [name], MD;  Location: [place] [facility];  Service: Cardiovascular;  Laterality: N/A;   CORONARY STENT INTERVENTION N/A 07/13/2018   Procedure: CORONARY STENT  INTERVENTION;  Surgeon: [name], MD;  Location: [place] [facility];  Service: Cardiovascular;  Laterality: N/A;   CORONARY STENT PLACEMENT  05/06/2015   LT CIRC DES X2   LEFT HEART CATH AND CORONARY ANGIOGRAPHY N/A 07/13/2018   Procedure: LEFT HEART CATH AND CORONARY ANGIOGRAPHY;  Surgeon: [name], MD;  Location: [place] [facility];  Service: Cardiovascular;  Laterality: N/A;   LEFT HEART CATH AND CORONARY ANGIOGRAPHY N/A 08/15/2020   Procedure: LEFT HEART CATH AND CORONARY ANGIOGRAPHY;  Surgeon: [name], [name], MD;  Location: [place] [facility];  Service: Cardiovascular;  Laterality: N/A;   POLYPECTOMY     SKIN CANCER EXCISION     right forearm/pre-cancerous/ 2 spots on left arm    Current Medications: Outpatient Medications Prior to Visit  Medication Sig Dispense Refill   acetaminophen (TYLENOL) 500 MG tablet Take 500-1,000 mg by mouth every 6 (six) hours as needed for mild pain or headache.     amLODipine (NORVASC) 5 MG tablet TAKE 1 TABLET BY MOUTH DAILY 90 tablet 3   aspirin 81 MG chewable tablet Chew 1 tablet (81 mg total) by mouth daily.     atorvastatin (LIPITOR) 80 MG tablet TAKE 1 TABLET BY MOUTH DAILY AT 6 PM. 90 tablet 3   clopidogrel (PLAVIX) 75 MG tablet TAKE 1 TABLET (75 MG TOTAL) BY MOUTH DAILY WITH BREAKFAST. 90 tablet 3   lisinopril (ZESTRIL) 40 MG tablet TAKE 1 TABLET BY MOUTH EVERY DAY 90 tablet 3   nitroGLYCERIN (NITROSTAT) 0.4 MG SL tablet Place 1 tablet (0.4 mg total) under the tongue every 5 (five) minutes x 3 doses as needed for chest pain. 25 tablet 3   omega-3 acid ethyl esters (LOVAZA) 1 g capsule Take 2 capsules (2 g total) by mouth 2 (two) times daily. 120 capsule 11   pantoprazole (PROTONIX) 40 MG tablet TAKE 1 TABLET BY MOUTH DAILY BEFORE BREAKFAST (Patient taking differently: Take 40 mg by mouth daily.) 90 tablet 3   Facility-Administered Medications Prior to Visit  Medication Dose Route Frequency Provider Last Rate Last Admin   sodium  chloride flush (NS) 0.9 % injection 3 mL  3 mL Intravenous Q12H [name], Taevon Aschoff M, MD         Allergies:   Penicillins and Prednisone   Social History   Socioeconomic History   Marital status: Married    Spouse name: Not on file   Number of children: Not on file   Years of education: Not on file   Highest education level: Not on file  Occupational History   Not on file  Tobacco Use   Smoking status: Never   Smokeless tobacco: Current    Types: Chew   Tobacco comments:    chew every day  Vaping Use   Vaping Use: Never used  Substance and Sexual Activity   Alcohol use: No    Alcohol/week: 0.0 standard drinks   Drug use: No  Sexual activity: Not on file  Other Topics Concern   Not on file  Social History Narrative   Not on file   Social Determinants of Health   Financial Resource Strain: Not on file  Food Insecurity: Not on file  Transportation Needs: Not on file  Physical Activity: Not on file  Stress: Not on file  Social Connections: Not on file     Family History:  The patient's family history includes Brain cancer in his sister; Cancer in his brother and father; Colon cancer in his brother and father; Kidney cancer in his brother, sister, sister, sister, and sister; Stroke in his mother.   ROS:   Please see the history of present illness.    ROSAll other systems reviewed and are negative.   Physical Exam:    VS:  BP (!) 162/86   Pulse 79   Ht _0  (1.651 m)   Wt 154 lb (69.9 kg)   SpO2 98%   BMI 25.63 kg/m    GENERAL:  Well appearing WM in NAD HEENT:  PERRL, EOMI, sclera are clear. Oropharynx is clear. NECK:  No jugular venous distention, carotid upstroke brisk and symmetric, no bruits, no thyromegaly or adenopathy LUNGS:  Clear to auscultation bilaterally CHEST:  Unremarkable HEART:  RRR,  PMI not displaced or sustained,S1 and S2 within normal limits, no S3, no S4: no clicks, no rubs, no murmurs ABD:  Soft, nontender. BS +, no masses or bruits. No  hepatomegaly, no splenomegaly EXT:  2 + pulses throughout, no edema, no cyanosis no clubbing SKIN:  Warm and dry.  No rashes NEURO:  Alert and oriented x 3. Cranial nerves II through XII intact. PSYCH:  Cognitively intact      Wt Readings from Last 3 Encounters:  11/24/20 154 lb (69.9 kg)  10/22/20 154 lb 1.6 oz (69.9 kg)  10/08/20 160 lb 3.2 oz (72.7 kg)      Studies/Labs Reviewed:     EKG:  EKG is  not ordered today.     Recent Labs: 04/21/2020: ALT 29 08/08/2020: BUN 11; Creatinine, Ser 0.99; Hemoglobin 15.2; Platelets 244; Potassium 4.3; Sodium 140   Recent Lipid Panel    Component Value Date/Time   CHOL 109 04/21/2020 0843   CHOL 97 (L) 05/10/2019 0850   TRIG 374.0 (H) 04/21/2020 0843   HDL 21.30 (L) 04/21/2020 0843   HDL 19 (L) 05/10/2019 0850   CHOLHDL 5 04/21/2020 0843   VLDL 74.8 (H) 04/21/2020 0843   LDLCALC 37 05/10/2019 0850   LDLDIRECT 44.0 04/21/2020 0843    Additional studies/ records that were reviewed today include:   Echo 07/13/18: IMPRESSIONS      1. The left ventricle has normal systolic function with an ejection fraction of 60-65%. The cavity size was normal. Left ventricular diastolic Doppler parameters are consistent with impaired relaxation. No evidence of left ventricular regional wall  motion abnormalities.  2. The right ventricle has normal systolic function. The cavity was normal. There is no increase in right ventricular wall thickness.    Cardiac cath/PCI 07/13/18: CORONARY BALLOON ANGIOPLASTY  CORONARY STENT INTERVENTION  LEFT HEART CATH AND CORONARY ANGIOGRAPHY  Conclusion    Prox RCA to Mid RCA lesion is 90% stenosed. Mid RCA lesion is 60% stenosed. Previously placed Ost Cx to Mid Cx stent (unknown type) is widely patent. Prox LAD lesion is 50% stenosed. Ost 1st Diag to 1st Diag lesion is 50% stenosed. Rivers intervention, there is a 25% residual stenosis. A stent was  successfully placed. Rivers intervention, there is a 25%  residual stenosis. The left ventricular systolic function is normal. LV end diastolic pressure is normal. The left ventricular ejection fraction is 50-55% by visual estimate.   SEGUNDO MAKELA is a 70 y.o. male      [id number] LOCATION:  FACILITY: [facility]  PHYSICIAN: [name], M.D. 09/20/1950     DATE OF PROCEDURE:  07/13/2018   DATE OF DISCHARGE:        CARDIAC CATHETERIZATION / PCI DES RCA       History obtained from chart review.PETAR MUCCI is a 70 y.o. male with a hx of CAD s/p PCI to RCA; staged PCI to P LCx 04/2015, bradycardia, DM2 (diet controlled) and DJD who presented to [facility] on 07/12/2018 with chest pain and SOB.     PROCEDURE DESCRIPTION:    The patient was brought to the second floor [facility]iac cath lab in the postabsorptive state. He was not premedicated . His right wrist was prepped and shaved in usual sterile fashion. Xylocaine 1% was used  for local anesthesia. A 6 French sheath was inserted into the right radial artery using standard Seldinger technique. The patient received 3500 units  of heparin intravenously.  A 5 [place] TIG catheter and pigtail catheters were used for selective coronary angiography and left ventriculography respectively.  Isovue dye was used for the entirety of the case.  Retrograde aorta, ventricular and pullback pressures were recorded.  Radial cocktail was administered via the SideArm sheath.   The patient received an additional 6500 units of heparin followed by 2000 units of heparin with an ACT in the high 200 range.  The patient received 600 mg of p.o. Plavix in addition to 20 mg of IV Pepcid.  Using a 6 [place] AL 0.75 guide catheter along with a whisper wire I was able to cross the proximal band and lesion with some difficulty ultimately placing the wire in the distal PLA branch.  I then predilated the proximal lesion with a 2 oh balloon but was unable to pass a stent because of tortuosity.  I then placed a guide liner just proximal  to the lesion and was able to pass a three 5 x 12 mm Synergy drug-eluting stent across the proximal in-stent restenosis deployed at 18 atm.  I then used a 04/20/2010 balloon to dilate the mid 50 to 60% lesion as well as Rivers dilate the stent.  There was still mild "dog boning" within the Synergy stent in the proximal portion with residual 25% stenosis.  The guidewire and guide liner were removed and completion angiography was performed.  The guide catheter was removed from the body, the sheath was removed and a TR band was placed on the right wrist to achieve patent hemostasis.  The patient left lab in stable condition.     IMPRESSION: Successful balloon angioplasty of mid dominant RCA in-stent restenosis and restenting of the proximal RCA for high-grade in-stent restenosis with acceptable results.  The previously placed circumflex stent was widely open and the LAD had at most moderate disease unchanged from his prior angiogram 3 years ago.  His LV function was normal with a low LVEDP.  He will need dual antiplatelet platelet therapy uninterrupted for at least 12 months.   [name]. MD, [facility] 07/13/2018 10:57 AM        CORONARY BALLOON ANGIOPLASTY  LEFT HEART CATH AND CORONARY ANGIOGRAPHY   Conclusion    Prox LAD lesion is 50% stenosed. Ost 1st  Diag to 1st Diag lesion is 50% stenosed. Non-stenotic Ost Cx to Mid Cx lesion was previously treated. Mid RCA lesion is 35% stenosed. Prox RCA lesion is 90% stenosed. Rivers intervention, there is a 50% residual stenosis. Balloon angioplasty was performed using a BALLOON [place] EMERGE MR 4.5X8. The left ventricular systolic function is normal. LV end diastolic pressure is normal. The left ventricular ejection fraction is 55-65% by visual estimate.   1. Single vessel obstructive CAD with in stent restenosis focal in the proximal RCA. This lesion has been stented twice before with stent underexpansion. 2. Normal LV function 3. Normal LVEDP 4.  Partially successful PCI of the proximal RCA with Shock wave therapy and aggressive expansion with an oversized 4.5 mm [place] balloon. Lesion reduced to 50% but stents still not fully expanded.   Plan: same day DC. Continue DAPT indefinitely. RCA lesion is at high risk for restenosis. If he has refractory angina despite medical therapy in the future may need to be considered for CABG.      ASSESSMENT:     No diagnosis found.   PLAN:     In order of problems listed above:  1. CAD - s/p inf STEMI in March 2017 tx with DES to RCA.  Staged PCI of the proximal to mid LCx with DES.  Recurrent symptoms in May 2020. S/p repeat PCI of the RCA with DES.  Continue ASA and Plavix indefinitely given multiple stents.  Continue  statin.   No beta blocker due to history of bradycardia.  S/p PCI of the proximal RCA with shock wave therapy and oversized [place] balloon. Residual 50% stenosis. Now with class 1 angina.    2. HTN - BP is well controlled.  3. HL - Continue statin. LDL at goal- 44. Triglycerides elevated. Encourage dietary modification and weight control. Will add Lovaza 2 grams bid.      Medication Adjustments/Labs and Tests Ordered: Current medicines are reviewed at length with the patient today.  Concerns regarding medicines are outlined above.  Medication changes, Labs and Tests ordered today are outlined in the Patient Instructions noted below. There are no Patient Instructions on file for this visit.     Signed, Tilla Wilborn [name], MD  11/24/2020 8:46 AM    [facility]

## 2020-11-24 ENCOUNTER — Other Ambulatory Visit: Payer: Self-pay

## 2020-11-24 ENCOUNTER — Ambulatory Visit (INDEPENDENT_AMBULATORY_CARE_PROVIDER_SITE_OTHER): Payer: Medicare Other | Admitting: Cardiology

## 2020-11-24 ENCOUNTER — Encounter: Payer: Self-pay | Admitting: Cardiology

## 2020-11-24 ENCOUNTER — Telehealth: Payer: Self-pay | Admitting: Cardiology

## 2020-11-24 VITALS — BP 130/70 | HR 79 | Ht 65.0 in | Wt 154.0 lb

## 2020-11-24 DIAGNOSIS — I200 Unstable angina: Secondary | ICD-10-CM | POA: Diagnosis not present

## 2020-11-24 DIAGNOSIS — I10 Essential (primary) hypertension: Secondary | ICD-10-CM | POA: Diagnosis not present

## 2020-11-24 DIAGNOSIS — E785 Hyperlipidemia, unspecified: Secondary | ICD-10-CM | POA: Diagnosis not present

## 2020-11-24 DIAGNOSIS — I25118 Atherosclerotic heart disease of native coronary artery with other forms of angina pectoris: Secondary | ICD-10-CM

## 2020-11-24 MED ORDER — AMLODIPINE BESYLATE 5 MG PO TABS: 5.0000 mg | ORAL_TABLET | Freq: Every day | ORAL | 3 refills | Status: DC

## 2020-11-24 MED ORDER — NITROGLYCERIN 0.4 MG SL SUBL: 0.4000 mg | SUBLINGUAL_TABLET | SUBLINGUAL | 11 refills | Status: AC | PRN

## 2020-11-24 NOTE — Telephone Encounter (Signed)
Spoke wife. She wanted to know if patient needed to have labs done today or wait until April 2023 at follow up appointment. Wife states labs were ordered by  C. Walker NP.  RN  spoke to Dr [name] , per Dr [name] patient can wait until April  2023 to  recheck labs. Wife made aware and states that is what the patient will do.

## 2020-11-24 NOTE — Telephone Encounter (Signed)
Pts wife is wanting to know if pt should still have blood work done as it was not mentioned during the pts appt this morning.. edu pt that the request is still active and would need to be done. Pt would like clarification from a nurse.. transferred to Triage nurse Ivin Booty.

## 2020-12-11 ENCOUNTER — Telehealth: Payer: Self-pay | Admitting: Family Medicine

## 2020-12-11 NOTE — Telephone Encounter (Signed)
Left message for patient to call back and schedule Medicare Annual Wellness Visit (AWV) either virtually or in office. Left  my Terry Rivers number [phone]   Last AWV ;01/28/16 please schedule at anytime with [facility] Nurse Health Advisor 1 or 2   This should be a 45 minute visit.

## 2021-01-15 ENCOUNTER — Telehealth: Payer: Self-pay | Admitting: Family Medicine

## 2021-01-15 NOTE — Telephone Encounter (Signed)
Spoke to spouse to schedule Medicare Annual Wellness Visit (AWV) either virtually or in office.   She stated patient was not interested at this time.  She wanted me to call back 08/2021   Last AWV 01/28/16  please schedule at anytime with [facility] Nurse Health Advisor 1 or 2   This should be a 45 minute visit.

## 2021-03-24 ENCOUNTER — Encounter: Payer: Self-pay | Admitting: Family Medicine

## 2021-03-25 ENCOUNTER — Telehealth (INDEPENDENT_AMBULATORY_CARE_PROVIDER_SITE_OTHER): Payer: Medicare Other | Admitting: Family Medicine

## 2021-03-25 DIAGNOSIS — J069 Acute upper respiratory infection, unspecified: Secondary | ICD-10-CM

## 2021-03-25 MED ORDER — HYDROCODONE BIT-HOMATROP MBR 5-1.5 MG/5ML PO SOLN: 5.0000 mL | Freq: Three times a day (TID) | ORAL | 0 refills | Status: DC | PRN

## 2021-03-25 NOTE — Progress Notes (Signed)
Patient ID: ULMER DEGEN, male   DOB: 08/10/1950, 71 y.o.   MRN: [id number]  This visit type was conducted due to national recommendations for restrictions regarding the COVID-19 pandemic in an effort to limit this patient's exposure and mitigate transmission in our community.   Virtual Visit via Telephone Note  I connected with Gypsy Lore on 03/25/21 at  8:00 AM EST by telephone and verified that I am speaking with the correct person using two identifiers.   I discussed the limitations, risks, security and privacy concerns of performing an evaluation and management service by telephone and the availability of in person appointments. I also discussed with the patient that there may be a patient responsible charge related to this service. The patient expressed understanding and agreed to proceed.  This was scheduled as a virtual visit but they were unable to connect  Location patient: home Location provider: work or home office Participants present for the call: patient, provider Patient did not have a visit in the prior 7 days to address this/these issue(s).   History of Present Illness:  Onset this past Friday of nasal congestion, cough, fatigue.  He states he had a "croupy cough ".  Question of some intermittent wheezing.  Has taken Robitussin-DM but has not helped his cough.  Cough severe at night and interfering with sleep.  Possible low-grade fever though none confirmed.  Has been using Tylenol.  Home COVID test negative.  No dyspnea.  Denies any nausea, vomiting, or diarrhea.  No known sick contacts.  He does have cardiac history but no recent chest pain.  Past Medical History:  Diagnosis Date   Arthritis    Benign essential HTN    Bradycardia    low 50"s per wife   CAD, multiple vessel-native arteries, emergent PCI RCA with DES 04/15/15 2017   a. 03/2014- inferior STEMI with occluded RCA; b. 04/2015: Staged PCI of LCx c. 07/13/18 [facility] to [facility] for ISR   Chicken pox    Diabetes  mellitus without complication ([place])    Borderline/no meds   Frequent headaches    GERD (gastroesophageal reflux disease)    Hyperlipidemia LDL goal <70 04/17/2015   Myocardial infarction ([place]) 03/2015   Tobacco use 04/17/2015   Unstable angina ([place]) 06/2018   Past Surgical History:  Procedure Laterality Date   CARDIAC CATHETERIZATION N/A 04/15/2015   Procedure: Left Heart Cath and Coronary Angiography;  Surgeon: Peter M [name], MD;  Location: [place] [facility];  Service: Cardiovascular;  Laterality: N/A;   CARDIAC CATHETERIZATION N/A 04/15/2015   Procedure: Coronary Stent Intervention;  Surgeon: Peter M [name], MD;  Location: [place] [facility];  Service: Cardiovascular;  Laterality: N/A;   CARDIAC CATHETERIZATION N/A 05/06/2015   Procedure: Coronary Stent Intervention;  Surgeon: Peter M [name], MD;  Location: [place] [facility];  Service: Cardiovascular;  Laterality: N/A;   CARDIAC CATHETERIZATION  05/06/2015   Procedure: Coronary/Graft Angiography;  Surgeon: Peter M [name], MD;  Location: [place] [facility];  Service: Cardiovascular;;   COLONOSCOPY     CORONARY BALLOON ANGIOPLASTY N/A 07/13/2018   Procedure: CORONARY BALLOON ANGIOPLASTY;  Surgeon: [name], MD;  Location: [place] [facility];  Service: Cardiovascular;  Laterality: N/A;   CORONARY BALLOON ANGIOPLASTY N/A 08/15/2020   Procedure: CORONARY BALLOON ANGIOPLASTY;  Surgeon: [name], Peter M, MD;  Location: [place] [facility];  Service: Cardiovascular;  Laterality: N/A;   CORONARY STENT INTERVENTION N/A 07/13/2018   Procedure: CORONARY STENT INTERVENTION;  Surgeon: Gwenlyn Found,  Pearletha Forge, MD;  Location: [place] [facility];  Service: Cardiovascular;  Laterality: N/A;   CORONARY STENT PLACEMENT  05/06/2015   LT CIRC DES X2   LEFT HEART CATH AND CORONARY ANGIOGRAPHY N/A 07/13/2018   Procedure: LEFT HEART CATH AND CORONARY ANGIOGRAPHY;  Surgeon: [name], MD;  Location: [place] [facility];  Service: Cardiovascular;  Laterality: N/A;    LEFT HEART CATH AND CORONARY ANGIOGRAPHY N/A 08/15/2020   Procedure: LEFT HEART CATH AND CORONARY ANGIOGRAPHY;  Surgeon: [name], Peter M, MD;  Location: [place] [facility];  Service: Cardiovascular;  Laterality: N/A;   POLYPECTOMY     SKIN CANCER EXCISION     right forearm/pre-cancerous/ 2 spots on left arm    reports that he has never smoked. His smokeless tobacco use includes chew. He reports that he does not drink alcohol and does not use drugs. family history includes Brain cancer in his sister; Cancer in his brother and father; Colon cancer in his brother and father; Kidney cancer in his brother, sister, sister, sister, and sister; Stroke in his mother. Allergies  Allergen Reactions   Penicillins Anaphylaxis, Shortness Of Breath and Swelling    Joints became swollen /turned purple Did it involve swelling of the face/tongue/throat, SOB, or low BP? No Did it involve sudden or severe rash/hives, skin peeling, or any reaction on the inside of your mouth or nose? No Did you need to seek medical attention at a hospital or doctor's office? Yes When did it last happen? "Many years ago"    If all above answers are "NO", may proceed with cephalosporin use.     Prednisone Swelling and Other (See Comments)    Made the patient's joints swell to the point where he could not walk      Observations/Objective: Patient sounds cheerful and well on the phone. I do not appreciate any SOB. Speech and thought processing are grossly intact. Patient reported vitals:  Assessment and Plan:  Suspected viral URI with cough.  Cough not relieved with over-the-counter medications.  We sent in Hycodan cough syrup which he has taken in the past 1 teaspoon nightly for severe cough.  Follow-up promptly for any fever or any persistent or worsening symptoms.  Follow Up Instructions:    [id number] [id number] [id number] 11-20 [id number] 21-30 I did not refer this patient for an OV in the next 24 hours for this/these  issue(s).  I discussed the assessment and treatment plan with the patient. The patient was provided an opportunity to ask questions and all were answered. The patient agreed with the plan and demonstrated an understanding of the instructions.   The patient was advised to call back or seek an in-person evaluation if the symptoms worsen or if the condition fails to improve as anticipated.  I provided 12 minutes of non-face-to-face time during this encounter.   Carolann Littler, MD

## 2021-04-02 ENCOUNTER — Other Ambulatory Visit: Payer: Self-pay | Admitting: Cardiology

## 2021-05-31 NOTE — Progress Notes (Signed)
?Cardiology Office Note:   ? ?Date:  06/05/2021  ? ?ID:  Terry Rivers, DOB 06/22/1950, MRN [id number] ? ?PCP:  [name], MD  ?Cardiologist:  Dr. Davontae Prusinski [name]   ? ?Chief Complaint  ?Patient presents with  ? Follow-up  ?  6 months.  ? Shortness of Breath  ? ? ?History of Present Illness:    ? ?Terry Rivers is a 71 y.o. male with a hx of CAD, bradycardia, DM (diet controlled), DJD who is seen as a work in for evaluation of chest pain.  ? ?Admitted 2/28-04/17/15 with inferior STEMI.  LHC demonstrated multivessel CAD with occluded RCA as the culprit. RCA was treated with DES.  He has residual LAD/Dx and LCx disease. He was not started on beta-blocker due to bradycardia. On 05/06/15 he underwent staged PCI of the proximal to mid LCx with a DES. The mid LAD had a 60% stenosis with FFR of 0.9 and was  treated medically. ? ?Has done very well until March 2020 when he began experiencing intermittent exertional chest pain and dyspnea.  He was admitted in May and had repeat cardiac cath showing restenosis in the RCA treated with DES. Stents in the  LCx still patent.  LAD disease unchanged. For BP control his lisinopril dose was increased and amlodipine added.  ? ?He was seen on 08/05/20 with  increasing episodes of angina.   On 08/15/2020 underwent cardiac catheterization showing single-vessel obstructive CAD with in-stent restenosis focal in the proximal RCA.  Of note lesion has been stented twice before with  stent underexpansion.  He underwent partially successful PCI of proximal RCA with Shock wave therapy and aggressive expansion with oversized 4.5 [place] balloon with lesion reduced to 50% but stent still not fully expanded.  He was recommended for continued indefinite DAPT.  RCA lesion noted to be high risk for restenosis and if he has refractory angina despite medical therapy in the future may need to be considered for CABG. When seen last in July he was doing well.  ? ?He is doing well today. He did have a bad flu like  illness for 2 weeks in February but this resolved. Negative for Covid. He is very active planting his garden and mowing. Denies any chest pain. No SOB.  No edema or palpitations ? ? ?Past Medical History:  ?Diagnosis Date  ? Arthritis   ? Benign essential HTN   ? Bradycardia   ? low 50"s per wife  ? CAD, multiple vessel-native arteries, emergent PCI RCA with DES 04/15/15 2017  ? a. 03/2014- inferior STEMI with occluded RCA; b. 04/2015: Staged PCI of LCx c. 07/13/18 PCI to [facility] for ISR  ? Chicken pox   ? Diabetes mellitus without complication ([place])   ? Borderline/no meds  ? Frequent headaches   ? GERD (gastroesophageal reflux disease)   ? Hyperlipidemia LDL goal <70 04/17/2015  ? Myocardial infarction [facility]) 03/2015  ? Tobacco use 04/17/2015  ? Unstable angina ([place]) 06/2018  ? ? ?Past Surgical History:  ?Procedure Laterality Date  ? CARDIAC CATHETERIZATION N/A 04/15/2015  ? Procedure: Left Heart Cath and Coronary Angiography;  Surgeon: Wynona Duhamel M [name], MD;  Location: [place] [facility];  Service: Cardiovascular;  Laterality: N/A;  ? CARDIAC CATHETERIZATION N/A 04/15/2015  ? Procedure: Coronary Stent Intervention;  Surgeon: Zakara Parkey M [name], MD;  Location: [place] [facility];  Service: Cardiovascular;  Laterality: N/A;  ? CARDIAC CATHETERIZATION N/A 05/06/2015  ? Procedure: Coronary Stent Intervention;  Surgeon: [name]  M [name], MD;  Location: [place] [facility];  Service: Cardiovascular;  Laterality: N/A;  ? CARDIAC CATHETERIZATION  05/06/2015  ? Procedure: Coronary/Graft Angiography;  Surgeon: Alizee Maple M [name], MD;  Location: [place] [facility];  Service: Cardiovascular;;  ? COLONOSCOPY    ? CORONARY BALLOON ANGIOPLASTY N/A 07/13/2018  ? Procedure: CORONARY BALLOON ANGIOPLASTY;  Surgeon: [name], MD;  Location: [place] [facility];  Service: Cardiovascular;  Laterality: N/A;  ? CORONARY BALLOON ANGIOPLASTY N/A 08/15/2020  ? Procedure: CORONARY BALLOON ANGIOPLASTY;  Surgeon: [name], Aliyana Dlugosz M, MD;  Location: [place] [facility];   Service: Cardiovascular;  Laterality: N/A;  ? CORONARY STENT INTERVENTION N/A 07/13/2018  ? Procedure: CORONARY STENT INTERVENTION;  Surgeon: [name], MD;  Location: [place] [facility];  Service: Cardiovascular;  Laterality: N/A;  ? CORONARY STENT PLACEMENT  05/06/2015  ? LT CIRC DES X2  ? LEFT HEART CATH AND CORONARY ANGIOGRAPHY N/A 07/13/2018  ? Procedure: LEFT HEART CATH AND CORONARY ANGIOGRAPHY;  Surgeon: [name], MD;  Location: [place] [facility];  Service: Cardiovascular;  Laterality: N/A;  ? LEFT HEART CATH AND CORONARY ANGIOGRAPHY N/A 08/15/2020  ? Procedure: LEFT HEART CATH AND CORONARY ANGIOGRAPHY;  Surgeon: [name], Naif Alabi M, MD;  Location: [place] [facility];  Service: Cardiovascular;  Laterality: N/A;  ? POLYPECTOMY    ? SKIN CANCER EXCISION    ? right forearm/pre-cancerous/ 2 spots on left arm  ? ? ?Current Medications: ?Outpatient Medications Prior to Visit  ?Medication Sig Dispense Refill  ? acetaminophen (TYLENOL) 500 MG tablet Take 500-1,000 mg by mouth every 6 (six) hours as needed for mild pain or headache.    ? amLODipine (NORVASC) 5 MG tablet Take 1 tablet (5 mg total) by mouth daily. 90 tablet 3  ? aspirin 81 MG chewable tablet Chew 1 tablet (81 mg total) by mouth daily.    ? atorvastatin (LIPITOR) 80 MG tablet TAKE 1 TABLET BY MOUTH DAILY AT 6 PM. 90 tablet 3  ? clopidogrel (PLAVIX) 75 MG tablet TAKE 1 TABLET (75 MG TOTAL) BY MOUTH DAILY WITH BREAKFAST. 90 tablet 3  ? HYDROcodone bit-homatropine (HYCODAN) 5-1.5 MG/5ML syrup Take 5 mLs by mouth every 8 (eight) hours as needed for cough. 120 mL 0  ? lisinopril (ZESTRIL) 40 MG tablet TAKE 1 TABLET BY MOUTH EVERY DAY 90 tablet 3  ? nitroGLYCERIN (NITROSTAT) 0.4 MG SL tablet Place 1 tablet (0.4 mg total) under the tongue every 5 (five) minutes x 3 doses as needed for chest pain. 25 tablet 11  ? omega-3 acid ethyl esters (LOVAZA) 1 g capsule TAKE 2 CAPSULES BY MOUTH 2 TIMES DAILY. 360 capsule 3  ? pantoprazole (PROTONIX) 40 MG tablet  TAKE 1 TABLET BY MOUTH DAILY BEFORE BREAKFAST (Patient taking differently: Take 40 mg by mouth daily.) 90 tablet 3  ? ?Facility-Administered Medications Prior to Visit  ?Medication Dose Route Frequency Provider Last Rate Last Admin  ? sodium chloride flush (NS) 0.9 % injection 3 mL  3 mL Intravenous Q12H [name], Antonetta Clanton M, MD      ?  ? ?Allergies:   Penicillins and Prednisone  ? ?Social History  ? ?Socioeconomic History  ? Marital status: Married  ?  Spouse name: Not on file  ? Number of children: Not on file  ? Years of education: Not on file  ? Highest education level: Not on file  ?Occupational History  ? Not on file  ?Tobacco Use  ? Smoking status: Never  ?  Smokeless tobacco: Current  ?  Types: Chew  ? Tobacco comments:  ?  chew every day  ?Vaping Use  ? Vaping Use: Never used  ?Substance and Sexual Activity  ? Alcohol use: No  ?  Alcohol/week: 0.0 standard drinks  ? Drug use: No  ? Sexual activity: Not on file  ?Other Topics Concern  ? Not on file  ?Social History Narrative  ? Not on file  ? ?Social Determinants of Health  ? ?Financial Resource Strain: Not on file  ?Food Insecurity: Not on file  ?Transportation Needs: Not on file  ?Physical Activity: Not on file  ?Stress: Not on file  ?Social Connections: Not on file  ?  ? ?Family History:  The patient's family history includes Brain cancer in his sister; Cancer in his brother and father; Colon cancer in his brother and father; Kidney cancer in his brother, sister, sister, sister, and sister; Stroke in his mother.  ? ?ROS:   ?Please see the history of present illness.    ?ROSAll other systems reviewed and are negative. ? ? ?Physical Exam:   ? ?VS:  BP 124/70 (BP Location: Left Arm, Patient Position: Sitting, Cuff Size: Normal)   Pulse (!) 53   Ht '5\' 5"'$  (1.651 m)   Wt 168 lb (76.2 kg)   BMI 27.96 kg/m?    ?GENERAL:  Well appearing WM in NAD ?HEENT:  PERRL, EOMI, sclera are clear. Oropharynx is clear. ?NECK:  No jugular venous distention, carotid upstroke brisk  and symmetric, no bruits, no thyromegaly or adenopathy ?LUNGS:  Clear to auscultation bilaterally ?CHEST:  Unremarkable ?HEART:  RRR,  PMI not displaced or sustained,S1 and S2 within normal limits, no S3,

## 2021-06-05 ENCOUNTER — Encounter: Payer: Self-pay | Admitting: Cardiology

## 2021-06-05 ENCOUNTER — Ambulatory Visit (INDEPENDENT_AMBULATORY_CARE_PROVIDER_SITE_OTHER): Payer: Medicare Other | Admitting: Cardiology

## 2021-06-05 VITALS — BP 124/70 | HR 53 | Ht 65.0 in | Wt 168.0 lb

## 2021-06-05 DIAGNOSIS — I25118 Atherosclerotic heart disease of native coronary artery with other forms of angina pectoris: Secondary | ICD-10-CM

## 2021-06-05 DIAGNOSIS — E785 Hyperlipidemia, unspecified: Secondary | ICD-10-CM

## 2021-06-05 DIAGNOSIS — I10 Essential (primary) hypertension: Secondary | ICD-10-CM

## 2021-06-05 LAB — LIPID PANEL
Chol/HDL Ratio: 5 ratio (ref 0.0–5.0)
Cholesterol, Total: 109 mg/dL (ref 100–199)
HDL: 22 mg/dL — ABNORMAL LOW (ref >39)
LDL Chol Calc (NIH): 58 mg/dL (ref 0–99)
Triglycerides: 166 mg/dL — ABNORMAL HIGH (ref 0–149)
VLDL Cholesterol Cal: 29 mg/dL (ref 5–40)

## 2021-06-05 LAB — HEPATIC FUNCTION PANEL
ALT: 24 IU/L (ref 0–44)
AST: 20 IU/L (ref 0–40)
Albumin: 4.6 g/dL (ref 3.7–4.7)
Alkaline Phosphatase: 45 IU/L (ref 44–121)
Bilirubin Total: 0.5 mg/dL (ref 0.0–1.2)
Bilirubin, Direct: 0.13 mg/dL (ref 0.00–0.40)
Total Protein: 7.6 g/dL (ref 6.0–8.5)

## 2021-06-05 LAB — BASIC METABOLIC PANEL
BUN/Creatinine Ratio: 12 (ref 10–24)
BUN: 11 mg/dL (ref 8–27)
CO2: 25 mmol/L (ref 20–29)
Calcium: 9.4 mg/dL (ref 8.6–10.2)
Chloride: 106 mmol/L (ref 96–106)
Creatinine, Ser: 0.9 mg/dL (ref 0.76–1.27)
Glucose: 119 mg/dL — ABNORMAL HIGH (ref 70–99)
Potassium: 4.7 mmol/L (ref 3.5–5.2)
Sodium: 143 mmol/L (ref 134–144)
eGFR: 90 mL/min/{1.73_m2} (ref >59)

## 2021-06-05 NOTE — Addendum Note (Signed)
Addended by: Kathyrn Lass on: 06/05/2021 08:07 AM ? ? Modules accepted: Orders ? ?

## 2021-08-01 ENCOUNTER — Other Ambulatory Visit: Payer: Self-pay | Admitting: Cardiology

## 2021-08-06 ENCOUNTER — Other Ambulatory Visit: Payer: Self-pay | Admitting: Cardiology

## 2021-08-12 ENCOUNTER — Telehealth: Payer: Self-pay | Admitting: Family Medicine

## 2021-08-12 NOTE — Telephone Encounter (Signed)
Spoke to spouse to schedule Medicare Annual Wellness Visit (AWV) either virtually or in office. Left  my [name] number [phone]  She stated she would call back to schedule  Last AWV 01/28/16 ; please schedule at anytime with [facility] Nurse Health Advisor 1 or 2

## 2021-08-19 ENCOUNTER — Telehealth: Payer: Self-pay | Admitting: Family Medicine

## 2021-08-19 MED ORDER — TRAMADOL HCL 50 MG PO TABS: 50.0000 mg | ORAL_TABLET | Freq: Four times a day (QID) | ORAL | 0 refills | Status: AC | PRN

## 2021-08-19 NOTE — Telephone Encounter (Signed)
I sent in limited tramadol which he has taken in the past.  Can take every 6 hours as needed.  This can cause some sedation.  Needs office follow-up if not improving after this.

## 2021-08-19 NOTE — Telephone Encounter (Signed)
c/o back pain for several days, states he has experienced this before. Only OV available for today was a 15 miniute slot, patient is over 71 y/o. Requests a prescription to help with pain

## 2021-08-19 NOTE — Telephone Encounter (Signed)
Spoke with the patient's wife and informed her of the message below.  

## 2021-09-28 ENCOUNTER — Telehealth: Payer: Self-pay | Admitting: Family Medicine

## 2021-09-28 NOTE — Telephone Encounter (Signed)
Spoke with spouse to schedule Medicare Annual Wellness Visit (AWV) either virtually or in office. Left  my [name] number [phone]   Spouse stated patient not interested  call back 2024  Last AWV 01/28/16 ; please schedule at anytime with [facility] Nurse Health Advisor 1 or 2

## 2021-09-30 ENCOUNTER — Ambulatory Visit (INDEPENDENT_AMBULATORY_CARE_PROVIDER_SITE_OTHER): Payer: Medicare Other | Admitting: Family Medicine

## 2021-09-30 ENCOUNTER — Encounter: Payer: Self-pay | Admitting: Family Medicine

## 2021-09-30 VITALS — BP 132/68 | HR 63 | Temp 98.1°F | Ht 65.0 in | Wt 162.1 lb

## 2021-09-30 DIAGNOSIS — M25511 Pain in right shoulder: Secondary | ICD-10-CM

## 2021-09-30 DIAGNOSIS — I25118 Atherosclerotic heart disease of native coronary artery with other forms of angina pectoris: Secondary | ICD-10-CM

## 2021-09-30 MED ORDER — CELECOXIB 200 MG PO CAPS: 200.0000 mg | ORAL_CAPSULE | Freq: Every day | ORAL | 0 refills | Status: DC

## 2021-09-30 NOTE — Progress Notes (Signed)
Established Patient Office Visit  Subjective   Patient ID: Terry Rivers, male    DOB: [birth year]  Age: 71 y.o. MRN: [id number]  Chief Complaint  Patient presents with   Shoulder Pain    Patient complains of right shoulder pain, x2 weeks    Ear Pain    Patient complains of ear pain, x2 weeks,     HPI   Terry Rivers has history of CAD, hyperlipidemia, GERD.  He is seen today with right shoulder pain.  Denies any injury.  Duration about 2 to 3 weeks.  Has some radiation toward the lower rib cage area but mostly centered around the [facility] joint.  No weakness.  Denies any neck pain.  No radiculitis symptoms.  Denies any chest pains.  No cough or dyspnea.  Has tried over-the-counter Tylenol without much benefit.  Pain worse with abduction and slightly with internal rotation and also adduction  Past Medical History:  Diagnosis Date   Arthritis    Benign essential HTN    Bradycardia    low 50"s per wife   CAD, multiple vessel-native arteries, emergent PCI RCA with DES 04/15/15 2017   a. 03/2014- inferior STEMI with occluded RCA; b. 04/2015: Staged PCI of LCx c. 07/13/18 PCI to [facility] for ISR   Chicken pox    Diabetes mellitus without complication ([place])    Borderline/no meds   Frequent headaches    GERD (gastroesophageal reflux disease)    Hyperlipidemia LDL goal <70 04/17/2015   Myocardial infarction ([place]) 03/2015   Tobacco use 04/17/2015   Unstable angina ([place]) 06/2018   Past Surgical History:  Procedure Laterality Date   CARDIAC CATHETERIZATION N/A 04/15/2015   Procedure: Left Heart Cath and Coronary Angiography;  Surgeon: Peter M [name], MD;  Location: [place] [facility];  Service: Cardiovascular;  Laterality: N/A;   CARDIAC CATHETERIZATION N/A 04/15/2015   Procedure: Coronary Stent Intervention;  Surgeon: Peter M [name], MD;  Location: [place] [facility];  Service: Cardiovascular;  Laterality: N/A;   CARDIAC CATHETERIZATION N/A 05/06/2015   Procedure: Coronary Stent Intervention;  Surgeon: Peter M  [name], MD;  Location: [place] [facility];  Service: Cardiovascular;  Laterality: N/A;   CARDIAC CATHETERIZATION  05/06/2015   Procedure: Coronary/Graft Angiography;  Surgeon: Peter M [name], MD;  Location: [place] [facility];  Service: Cardiovascular;;   COLONOSCOPY     CORONARY BALLOON ANGIOPLASTY N/A 07/13/2018   Procedure: CORONARY BALLOON ANGIOPLASTY;  Surgeon: [name], MD;  Location: [place] [facility];  Service: Cardiovascular;  Laterality: N/A;   CORONARY BALLOON ANGIOPLASTY N/A 08/15/2020   Procedure: CORONARY BALLOON ANGIOPLASTY;  Surgeon: [name], Peter M, MD;  Location: [place] [facility];  Service: Cardiovascular;  Laterality: N/A;   CORONARY STENT INTERVENTION N/A 07/13/2018   Procedure: CORONARY STENT INTERVENTION;  Surgeon: [name], MD;  Location: [place] [facility];  Service: Cardiovascular;  Laterality: N/A;   CORONARY STENT PLACEMENT  05/06/2015   LT CIRC DES X2   LEFT HEART CATH AND CORONARY ANGIOGRAPHY N/A 07/13/2018   Procedure: LEFT HEART CATH AND CORONARY ANGIOGRAPHY;  Surgeon: [name], MD;  Location: [place] [facility];  Service: Cardiovascular;  Laterality: N/A;   LEFT HEART CATH AND CORONARY ANGIOGRAPHY N/A 08/15/2020   Procedure: LEFT HEART CATH AND CORONARY ANGIOGRAPHY;  Surgeon: [name], Peter M, MD;  Location: [place] [facility];  Service: Cardiovascular;  Laterality: N/A;   POLYPECTOMY     SKIN CANCER EXCISION  right forearm/pre-cancerous/ 2 spots on left arm    reports that he has never smoked. His smokeless tobacco use includes chew. He reports that he does not drink alcohol and does not use drugs. family history includes Brain cancer in his sister; Cancer in his brother and father; Colon cancer in his brother and father; Kidney cancer in his brother, sister, sister, sister, and sister; Stroke in his mother. Allergies  Allergen Reactions   Penicillins Anaphylaxis, Shortness Of Breath and Swelling    Joints became swollen /turned  purple Did it involve swelling of the face/tongue/throat, SOB, or low BP? No Did it involve sudden or severe rash/hives, skin peeling, or any reaction on the inside of your mouth or nose? No Did you need to seek medical attention at a hospital or doctor's office? Yes When did it last happen? "Many years ago"    If all above answers are "NO", may proceed with cephalosporin use.     Prednisone Swelling and Other (See Comments)    Made the patient's joints swell to the point where he could not walk    Review of Systems  Respiratory:  Negative for shortness of breath.   Cardiovascular:  Negative for chest pain.  Musculoskeletal:  Negative for neck pain.  Neurological:  Negative for weakness.      Objective:     BP 132/68 (BP Location: Left Arm, Cuff Size: Normal)   Pulse 63   Temp 98.1 F (36.7 C) (Oral)   Ht '5\' 5"'$  (1.651 m)   Wt 162 lb 1.6 oz (73.5 kg)   SpO2 95%   BMI 26.97 kg/m  BP Readings from Last 3 Encounters:  09/30/21 132/68  06/05/21 124/70  11/24/20 130/70   Wt Readings from Last 3 Encounters:  09/30/21 162 lb 1.6 oz (73.5 kg)  06/05/21 168 lb (76.2 kg)  11/24/20 154 lb (69.9 kg)      Physical Exam Vitals reviewed.  Constitutional:      Appearance: Normal appearance.  Cardiovascular:     Rate and Rhythm: Normal rate and regular rhythm.  Pulmonary:     Effort: Pulmonary effort is normal.     Breath sounds: Normal breath sounds. No wheezing or rales.  Musculoskeletal:     Comments: Right shoulder no visible swelling.  He does have some tenderness at the acromioclavicular joint and does have some reproducible pain with adduction Minimal pain with abduction greater than 90 degrees against resistance. No biceps tenderness.  Neurological:     Mental Status: He is alert.     Comments: No appreciable rotator cuff weakness      No results found for any visits on 09/30/21.    The ASCVD Risk score (Arnett DK, et al., 2019) failed to calculate for the  following reasons:   The patient has a prior MI or stroke diagnosis    Assessment & Plan:   Patient presents with 2 to 3-week history of right shoulder pain.  Pain seems to be localized around the right acromioclavicular joint.  Suspect degenerative arthritis right acromioclavicular joint.  No reported injury.  No improvement with Tylenol.  Offered short-term use of Celebrex 200 mg once daily.  Cautioned about potential side effects.  Be in touch if not improving 2 to 3 weeks.  Consider x-ray and consideration for corticosteroid injection if not improved in a few weeks  No follow-ups on file.    Carolann Littler, MD

## 2021-09-30 NOTE — Patient Instructions (Signed)
Suspect arthritis flare of the acromioclavicular joint  Let me know in 2-3 weeks if shoulder pain no better in 2-3 weeks.

## 2021-10-23 ENCOUNTER — Ambulatory Visit (INDEPENDENT_AMBULATORY_CARE_PROVIDER_SITE_OTHER): Payer: Medicare Other

## 2021-10-23 DIAGNOSIS — Z23 Encounter for immunization: Secondary | ICD-10-CM | POA: Diagnosis not present

## 2021-10-27 ENCOUNTER — Other Ambulatory Visit: Payer: Self-pay | Admitting: Family Medicine

## 2021-10-27 NOTE — Telephone Encounter (Signed)
Last refill-09-30-21-30 tabs, 0 refill Last OV-09-30-21  Per last OV 09-30-21 pt to use medication for short term use.   Please advise if okay for refill

## 2021-11-01 ENCOUNTER — Other Ambulatory Visit: Payer: Self-pay | Admitting: Cardiology

## 2021-12-06 NOTE — Progress Notes (Unsigned)
Cardiology Office Note:    Date:  12/09/2021   ID:  Terry Rivers, DOB 01/14/1951, MRN [id number]  PCP:  [name], MD  Cardiologist:  Dr. Boneta Standre [name]    Chief Complaint  Patient presents with   Follow-up   Coronary Artery Disease    History of Present Illness:     Terry Rivers is a 71 y.o. male with a hx of CAD, bradycardia, DM (diet controlled), DJD who is seen as a work in for evaluation of chest pain.   Admitted 2/28-04/17/15 with inferior STEMI.  LHC demonstrated multivessel CAD with occluded RCA as the culprit. RCA was treated with DES.  He has residual LAD/Dx and LCx disease. He was not started on beta-blocker due to bradycardia. On 05/06/15 he underwent staged PCI of the proximal to mid LCx with a DES. The mid LAD had a 60% stenosis with FFR of 0.9 and was  treated medically.  Has done very well until March 2020 when he began experiencing intermittent exertional chest pain and dyspnea.  He was admitted in May and had repeat cardiac cath showing restenosis in the RCA treated with DES. Stents in the  LCx still patent.  LAD disease unchanged. For BP control his lisinopril dose was increased and amlodipine added.   He was seen on 08/05/20 with  increasing episodes of angina.   On 08/15/2020 underwent cardiac catheterization showing single-vessel obstructive CAD with in-stent restenosis focal in the proximal RCA.  Of note lesion has been stented twice before with  stent underexpansion.  He underwent partially successful PCI of proximal RCA with Shock wave therapy and aggressive expansion with oversized 4.5 [place] balloon with lesion reduced to 50% but stent still not fully expanded.  He was recommended for continued indefinite DAPT.  RCA lesion noted to be high risk for restenosis and if he has refractory angina despite medical therapy in the future may need to be considered for CABG. When seen last in April he was doing well.   Patient states he is doing well. Has been walking daily.  Doing a lot of mowing. He denies any chest pain or SOB. 2 weeks ago he experienced a fast HR. States HR up to 100. This resolved with rest. Oxygen level was normal. Lasted < 30 minutes.      Past Medical History:  Diagnosis Date   Arthritis    Benign essential HTN    Bradycardia    low 50"s per wife   CAD, multiple vessel-native arteries, emergent PCI RCA with DES 04/15/15 2017   a. 03/2014- inferior STEMI with occluded RCA; b. 04/2015: Staged PCI of LCx c. 07/13/18 PCI to [facility] for ISR   Chicken pox    Diabetes mellitus without complication ([place])    Borderline/no meds   Frequent headaches    GERD (gastroesophageal reflux disease)    Hyperlipidemia LDL goal <70 04/17/2015   Myocardial infarction ([place]) 03/2015   Tobacco use 04/17/2015   Unstable angina ([place]) 06/2018    Past Surgical History:  Procedure Laterality Date   CARDIAC CATHETERIZATION N/A 04/15/2015   Procedure: Left Heart Cath and Coronary Angiography;  Surgeon: Teofil Maniaci M [name], MD;  Location: [place] [facility];  Service: Cardiovascular;  Laterality: N/A;   CARDIAC CATHETERIZATION N/A 04/15/2015   Procedure: Coronary Stent Intervention;  Surgeon: Lew Prout M [name], MD;  Location: [place] [facility];  Service: Cardiovascular;  Laterality: N/A;   CARDIAC CATHETERIZATION N/A 05/06/2015   Procedure: Coronary Stent Intervention;  Surgeon:  Hisashi Amadon M [name], MD;  Location: [place] [facility];  Service: Cardiovascular;  Laterality: N/A;   CARDIAC CATHETERIZATION  05/06/2015   Procedure: Coronary/Graft Angiography;  Surgeon: Jordin Dambrosio M [name], MD;  Location: [place] [facility];  Service: Cardiovascular;;   COLONOSCOPY     CORONARY BALLOON ANGIOPLASTY N/A 07/13/2018   Procedure: CORONARY BALLOON ANGIOPLASTY;  Surgeon: [name], MD;  Location: [place] [facility];  Service: Cardiovascular;  Laterality: N/A;   CORONARY BALLOON ANGIOPLASTY N/A 08/15/2020   Procedure: CORONARY BALLOON ANGIOPLASTY;  Surgeon: [name], Broly Hatfield M, MD;  Location: [place] [facility];  Service: Cardiovascular;  Laterality: N/A;   CORONARY STENT INTERVENTION N/A 07/13/2018   Procedure: CORONARY STENT INTERVENTION;  Surgeon: [name], MD;  Location: [place] [facility];  Service: Cardiovascular;  Laterality: N/A;   CORONARY STENT PLACEMENT  05/06/2015   LT CIRC DES X2   LEFT HEART CATH AND CORONARY ANGIOGRAPHY N/A 07/13/2018   Procedure: LEFT HEART CATH AND CORONARY ANGIOGRAPHY;  Surgeon: [name], MD;  Location: [place] [facility];  Service: Cardiovascular;  Laterality: N/A;   LEFT HEART CATH AND CORONARY ANGIOGRAPHY N/A 08/15/2020   Procedure: LEFT HEART CATH AND CORONARY ANGIOGRAPHY;  Surgeon: [name], Shelia Magallon M, MD;  Location: [place] [facility];  Service: Cardiovascular;  Laterality: N/A;   POLYPECTOMY     SKIN CANCER EXCISION     right forearm/pre-cancerous/ 2 spots on left arm    Current Medications: Outpatient Medications Prior to Visit  Medication Sig Dispense Refill   acetaminophen (TYLENOL) 500 MG tablet Take 500-1,000 mg by mouth every 6 (six) hours as needed for mild pain or headache.     aspirin 81 MG chewable tablet Chew 1 tablet (81 mg total) by mouth daily.     celecoxib (CELEBREX) 200 MG capsule TAKE 1 CAPSULE BY MOUTH EVERY DAY 30 capsule 0   clopidogrel (PLAVIX) 75 MG tablet TAKE 1 TABLET BY MOUTH DAILY WITH BREAKFAST. 90 tablet 3   HYDROcodone bit-homatropine (HYCODAN) 5-1.5 MG/5ML syrup Take 5 mLs by mouth every 8 (eight) hours as needed for cough. 120 mL 0   lisinopril (ZESTRIL) 40 MG tablet TAKE 1 TABLET BY MOUTH EVERY DAY 90 tablet 3   nitroGLYCERIN (NITROSTAT) 0.4 MG SL tablet Place 1 tablet (0.4 mg total) under the tongue every 5 (five) minutes x 3 doses as needed for chest pain. 25 tablet 11   omega-3 acid ethyl esters (LOVAZA) 1 g capsule TAKE 2 CAPSULES BY MOUTH 2 TIMES DAILY. 360 capsule 3   pantoprazole (PROTONIX) 40 MG tablet TAKE 1 TABLET BY MOUTH EVERY DAY BEFORE BREAKFAST 90 tablet 3   amLODipine (NORVASC) 5 MG  tablet Take 1 tablet (5 mg total) by mouth daily. 90 tablet 3   atorvastatin (LIPITOR) 80 MG tablet TAKE 1 TABLET BY MOUTH DAILY AT 6 PM. 90 tablet 3   sodium chloride flush (NS) 0.9 % injection 3 mL      No facility-administered medications prior to visit.     Allergies:   Penicillins and Prednisone   Social History   Socioeconomic History   Marital status: Married    Spouse name: Not on file   Number of children: Not on file   Years of education: Not on file   Highest education level: Not on file  Occupational History   Not on file  Tobacco Use   Smoking status: Never   Smokeless tobacco: Current    Types: Chew   Tobacco  comments:    chew every day  Vaping Use   Vaping Use: Never used  Substance and Sexual Activity   Alcohol use: No    Alcohol/week: 0.0 standard drinks of alcohol   Drug use: No   Sexual activity: Not on file  Other Topics Concern   Not on file  Social History Narrative   Not on file   Social Determinants of Health   Financial Resource Strain: Not on file  Food Insecurity: Not on file  Transportation Needs: Not on file  Physical Activity: Not on file  Stress: Not on file  Social Connections: Not on file     Family History:  The patient's family history includes Brain cancer in his sister; Cancer in his brother and father; Colon cancer in his brother and father; Kidney cancer in his brother, sister, sister, sister, and sister; Stroke in his mother.   ROS:   Please see the history of present illness.    ROSAll other systems reviewed and are negative.   Physical Exam:    VS:  BP 132/74 (BP Location: Left Arm, Patient Position: Sitting, Cuff Size: Normal)   Pulse 66   Ht '5\' 5"'  (1.651 m)   Wt 163 lb 12.8 oz (74.3 kg)   SpO2 97%   BMI 27.26 kg/m    GENERAL:  Well appearing WM in NAD HEENT:  PERRL, EOMI, sclera are clear. Oropharynx is clear. NECK:  No jugular venous distention, carotid upstroke brisk and symmetric, no bruits, no thyromegaly  or adenopathy LUNGS:  Clear to auscultation bilaterally CHEST:  Unremarkable HEART:  RRR,  PMI not displaced or sustained,S1 and S2 within normal limits, no S3, no S4: no clicks, no rubs, no murmurs ABD:  Soft, nontender. BS +, no masses or bruits. No hepatomegaly, no splenomegaly EXT:  2 + pulses throughout, no edema, no cyanosis no clubbing SKIN:  Warm and dry.  No rashes NEURO:  Alert and oriented x 3. Cranial nerves II through XII intact. PSYCH:  Cognitively intact      Wt Readings from Last 3 Encounters:  12/09/21 163 lb 12.8 oz (74.3 kg)  09/30/21 162 lb 1.6 oz (73.5 kg)  06/05/21 168 lb (76.2 kg)      Studies/Labs Reviewed:     EKG:  EKG is  not ordered today.     Recent Labs: 06/05/2021: ALT 24; BUN 11; Creatinine, Ser 0.90; Potassium 4.7; Sodium 143   Recent Lipid Panel    Component Value Date/Time   CHOL 109 06/05/2021 0822   TRIG 166 (H) 06/05/2021 0822   HDL 22 (L) 06/05/2021 0822   CHOLHDL 5.0 06/05/2021 0822   CHOLHDL 5 04/21/2020 0843   VLDL 74.8 (H) 04/21/2020 0843   LDLCALC 58 06/05/2021 0822   LDLDIRECT 44.0 04/21/2020 0843    Additional studies/ records that were reviewed today include:   Echo 07/13/18: IMPRESSIONS      1. The left ventricle has normal systolic function with an ejection fraction of 60-65%. The cavity size was normal. Left ventricular diastolic Doppler parameters are consistent with impaired relaxation. No evidence of left ventricular regional wall  motion abnormalities.  2. The right ventricle has normal systolic function. The cavity was normal. There is no increase in right ventricular wall thickness.    Cardiac cath/PCI 07/13/18: CORONARY BALLOON ANGIOPLASTY  CORONARY STENT INTERVENTION  LEFT HEART CATH AND CORONARY ANGIOGRAPHY  Conclusion    Prox RCA to Mid RCA lesion is 90% stenosed. Mid RCA lesion is 60% stenosed.  Previously placed Ost Cx to Mid Cx stent (unknown type) is widely patent. Prox LAD lesion is 50%  stenosed. Ost 1st Diag to 1st Diag lesion is 50% stenosed. Post intervention, there is a 25% residual stenosis. A stent was successfully placed. Post intervention, there is a 25% residual stenosis. The left ventricular systolic function is normal. LV end diastolic pressure is normal. The left ventricular ejection fraction is 50-55% by visual estimate.   TILDON SILVERIA is a 71 y.o. male      [id number] LOCATION:  FACILITY: [facility]  PHYSICIAN: [name], M.D. [birth year]     DATE OF PROCEDURE:  07/13/2018   DATE OF DISCHARGE:        CARDIAC CATHETERIZATION / PCI DES RCA       History obtained from chart review.JAHKEEM KURKA is a 71 y.o. male with a hx of CAD s/p PCI to RCA; staged PCI to P LCx 04/2015, bradycardia, DM2 (diet controlled) and DJD who presented to [facility] on 07/12/2018 with chest pain and SOB.     PROCEDURE DESCRIPTION:    The patient was brought to the second floor [facility]iac cath lab in the postabsorptive state. He was not premedicated . His right wrist was prepped and shaved in usual sterile fashion. Xylocaine 1% was used  for local anesthesia. A 6 French sheath was inserted into the right radial artery using standard Seldinger technique. The patient received 3500 units  of heparin intravenously.  A 5 [place] TIG catheter and pigtail catheters were used for selective coronary angiography and left ventriculography respectively.  Isovue dye was used for the entirety of the case.  Retrograde aorta, ventricular and pullback pressures were recorded.  Radial cocktail was administered via the SideArm sheath.   The patient received an additional 6500 units of heparin followed by 2000 units of heparin with an ACT in the high 200 range.  The patient received 600 mg of p.o. Plavix in addition to 20 mg of IV Pepcid.  Using a 6 [place] AL 0.75 guide catheter along with a whisper wire I was able to cross the proximal band and lesion with some difficulty ultimately placing the wire  in the distal PLA branch.  I then predilated the proximal lesion with a 2 oh balloon but was unable to pass a stent because of tortuosity.  I then placed a guide liner just proximal to the lesion and was able to pass a three 5 x 12 mm Synergy drug-eluting stent across the proximal in-stent restenosis deployed at 18 atm.  I then used a 04/20/2010 balloon to dilate the mid 50 to 60% lesion as well as post dilate the stent.  There was still mild "dog boning" within the Synergy stent in the proximal portion with residual 25% stenosis.  The guidewire and guide liner were removed and completion angiography was performed.  The guide catheter was removed from the body, the sheath was removed and a TR band was placed on the right wrist to achieve patent hemostasis.  The patient left lab in stable condition.     IMPRESSION: Successful balloon angioplasty of mid dominant RCA in-stent restenosis and restenting of the proximal RCA for high-grade in-stent restenosis with acceptable results.  The previously placed circumflex stent was widely open and the LAD had at most moderate disease unchanged from his prior angiogram 3 years ago.  His LV function was normal with a low LVEDP.  He will need dual antiplatelet platelet therapy uninterrupted for at least 12 months.  [name]. MD, [facility] 07/13/2018 10:57 AM        CORONARY BALLOON ANGIOPLASTY  LEFT HEART CATH AND CORONARY ANGIOGRAPHY   Conclusion    Prox LAD lesion is 50% stenosed. Ost 1st Diag to 1st Diag lesion is 50% stenosed. Non-stenotic Ost Cx to Mid Cx lesion was previously treated. Mid RCA lesion is 35% stenosed. Prox RCA lesion is 90% stenosed. Post intervention, there is a 50% residual stenosis. Balloon angioplasty was performed using a BALLOON [place] EMERGE MR 4.5X8. The left ventricular systolic function is normal. LV end diastolic pressure is normal. The left ventricular ejection fraction is 55-65% by visual estimate.   1. Single vessel  obstructive CAD with in stent restenosis focal in the proximal RCA. This lesion has been stented twice before with stent underexpansion. 2. Normal LV function 3. Normal LVEDP 4. Partially successful PCI of the proximal RCA with Shock wave therapy and aggressive expansion with an oversized 4.5 mm [place] balloon. Lesion reduced to 50% but stents still not fully expanded.   Plan: same day DC. Continue DAPT indefinitely. RCA lesion is at high risk for restenosis. If he has refractory angina despite medical therapy in the future may need to be considered for CABG.      ASSESSMENT:     1. Coronary artery disease of native artery of native heart with stable angina pectoris ([place])   2. Essential hypertension   3. Hyperlipidemia LDL goal <70      PLAN:     In order of problems listed above:  1. CAD - s/p inf STEMI in March 2017 tx with DES to RCA.  Staged PCI of the proximal to mid LCx with DES.  Recurrent symptoms in May 2020. S/p repeat PCI of the RCA with DES.  Continue ASA and Plavix indefinitely given multiple stents.  Continue  statin.   No beta blocker due to history of bradycardia.  S/p PCI of the proximal RCA with shock wave therapy and oversized [place] balloon. Residual 50% stenosis. Now with class 1 angina.    2. HTN - BP is well controlled.  3. HL - Continue statin.  Encourage dietary modification and weight control. Triglycerides much better after starting Lovaza.     Medication Adjustments/Labs and Tests Ordered: Current medicines are reviewed at length with the patient today.  Concerns regarding medicines are outlined above.  Medication changes, Labs and Tests ordered today are outlined in the Patient Instructions noted below. There are no Patient Instructions on file for this visit.     Signed, Coen Miyasato [name], MD  12/09/2021 8:11 AM    [facility]

## 2021-12-09 ENCOUNTER — Ambulatory Visit: Payer: Medicare Other | Admitting: Cardiology

## 2021-12-09 ENCOUNTER — Other Ambulatory Visit: Payer: Self-pay

## 2021-12-09 ENCOUNTER — Encounter: Payer: Self-pay | Admitting: Cardiology

## 2021-12-09 ENCOUNTER — Ambulatory Visit: Payer: Medicare Other | Attending: Cardiology | Admitting: Cardiology

## 2021-12-09 VITALS — BP 132/74 | HR 66 | Ht 65.0 in | Wt 163.8 lb

## 2021-12-09 DIAGNOSIS — I25118 Atherosclerotic heart disease of native coronary artery with other forms of angina pectoris: Secondary | ICD-10-CM | POA: Insufficient documentation

## 2021-12-09 DIAGNOSIS — I10 Essential (primary) hypertension: Secondary | ICD-10-CM | POA: Diagnosis not present

## 2021-12-09 DIAGNOSIS — E785 Hyperlipidemia, unspecified: Secondary | ICD-10-CM | POA: Diagnosis not present

## 2021-12-09 MED ORDER — AMLODIPINE BESYLATE 5 MG PO TABS: 5.0000 mg | ORAL_TABLET | Freq: Every day | ORAL | 3 refills | Status: DC

## 2021-12-09 MED ORDER — ATORVASTATIN CALCIUM 80 MG PO TABS: ORAL_TABLET | ORAL | 3 refills | Status: DC

## 2021-12-09 NOTE — Addendum Note (Signed)
Addended by: Kathyrn Lass on: 12/09/2021 08:14 AM   Modules accepted: Orders

## 2022-02-09 ENCOUNTER — Telehealth: Payer: Self-pay | Admitting: Family Medicine

## 2022-02-09 NOTE — Telephone Encounter (Signed)
Spoke with patient spouse to schedule AWV  She stated patient is not interested in schedule an AWV  do not call

## 2022-02-25 ENCOUNTER — Other Ambulatory Visit: Payer: Self-pay | Admitting: Family Medicine

## 2022-02-27 ENCOUNTER — Other Ambulatory Visit: Payer: Self-pay | Admitting: Cardiology

## 2022-06-07 DIAGNOSIS — I10 Essential (primary) hypertension: Secondary | ICD-10-CM | POA: Diagnosis not present

## 2022-06-07 DIAGNOSIS — I25118 Atherosclerotic heart disease of native coronary artery with other forms of angina pectoris: Secondary | ICD-10-CM | POA: Diagnosis not present

## 2022-06-07 DIAGNOSIS — E785 Hyperlipidemia, unspecified: Secondary | ICD-10-CM | POA: Diagnosis not present

## 2022-06-08 LAB — LIPID PANEL
Chol/HDL Ratio: 4.7 ratio (ref 0.0–5.0)
Cholesterol, Total: 104 mg/dL (ref 100–199)
HDL: 22 mg/dL — ABNORMAL LOW (ref >39)
LDL Chol Calc (NIH): 45 mg/dL (ref 0–99)
Triglycerides: 231 mg/dL — ABNORMAL HIGH (ref 0–149)
VLDL Cholesterol Cal: 37 mg/dL (ref 5–40)

## 2022-06-08 LAB — BASIC METABOLIC PANEL
BUN/Creatinine Ratio: 21 (ref 10–24)
BUN: 19 mg/dL (ref 8–27)
CO2: 20 mmol/L (ref 20–29)
Calcium: 9.9 mg/dL (ref 8.6–10.2)
Chloride: 104 mmol/L (ref 96–106)
Creatinine, Ser: 0.91 mg/dL (ref 0.76–1.27)
Glucose: 108 mg/dL — ABNORMAL HIGH (ref 70–99)
Potassium: 4.3 mmol/L (ref 3.5–5.2)
Sodium: 142 mmol/L (ref 134–144)
eGFR: 88 mL/min/{1.73_m2} (ref >59)

## 2022-06-08 LAB — HEPATIC FUNCTION PANEL
ALT: 42 IU/L (ref 0–44)
AST: 33 IU/L (ref 0–40)
Albumin: 4.4 g/dL (ref 3.8–4.8)
Alkaline Phosphatase: 46 IU/L (ref 44–121)
Bilirubin Total: 0.5 mg/dL (ref 0.0–1.2)
Bilirubin, Direct: 0.13 mg/dL (ref 0.00–0.40)
Total Protein: 7.6 g/dL (ref 6.0–8.5)

## 2022-06-08 NOTE — Progress Notes (Signed)
Cardiology Office Note:    Date:  06/14/2022   ID:  Terry Rivers, DOB July 27, 1950, MRN [id number]  PCP:  Kristian Covey, MD  Cardiologist:  Dr. Asani Mcburney [place]    Chief Complaint  Patient presents with   Coronary Artery Disease    History of Present Illness:     Terry Rivers is a 72 y.o. male with a hx of CAD, bradycardia, DM (diet controlled), DJD who is seen as a work in for evaluation of chest pain.   Admitted 2/28-04/17/15 with inferior STEMI.  LHC demonstrated multivessel CAD with occluded RCA as the culprit. RCA was treated with DES.  He has residual LAD/Dx and LCx disease. He was not started on beta-blocker due to bradycardia. On 05/06/15 he underwent staged PCI of the proximal to mid LCx with a DES. The mid LAD had a 60% stenosis with FFR of 0.9 and was  treated medically.  Has done very well until March 2020 when he began experiencing intermittent exertional chest pain and dyspnea.  He was admitted in May and had repeat cardiac cath showing restenosis in the RCA treated with DES. Stents in the  LCx still patent.  LAD disease unchanged. For BP control his lisinopril dose was increased and amlodipine added.   He was seen on 08/05/20 with  increasing episodes of angina.   On 08/15/2020 underwent cardiac catheterization showing single-vessel obstructive CAD with in-stent restenosis focal in the proximal RCA.  Of note lesion has been stented twice before with  stent underexpansion.  He underwent partially successful PCI of proximal RCA with Shock wave therapy and aggressive expansion with oversized 4.5 [place] balloon with lesion reduced to 50% but stent still not fully expanded.  He was recommended for continued indefinite DAPT.  RCA lesion noted to be high risk for restenosis and if he has refractory angina despite medical therapy in the future may need to be considered for CABG. When seen last in April he was doing well.   Patient states he is doing well. Has been walking daily. Doing a lot of  mowing and gardening. Marland Kitchen He denies any chest pain. Only gets SOB if pulling trash can out to road a long way. Typically BP at home 130/70. Overall feels well.      Past Medical History:  Diagnosis Date   Arthritis    Benign essential HTN    Bradycardia    low 50"s per wife   CAD, multiple vessel-native arteries, emergent PCI RCA with DES 04/15/15 2017   a. 03/2014- inferior STEMI with occluded RCA; b. 04/2015: Staged PCI of LCx c. 07/13/18 PCI to [facility] for ISR   Chicken pox    Diabetes mellitus without complication (HCC)    Borderline/no meds   Frequent headaches    GERD (gastroesophageal reflux disease)    Hyperlipidemia LDL goal <70 04/17/2015   Myocardial infarction (HCC) 03/2015   Tobacco use 04/17/2015   Unstable angina (HCC) 06/2018    Past Surgical History:  Procedure Laterality Date   CARDIAC CATHETERIZATION N/A 04/15/2015   Procedure: Left Heart Cath and Coronary Angiography;  Surgeon: Emerson Schreifels M [place], MD;  Location: [facility] INVASIVE CV LAB;  Service: Cardiovascular;  Laterality: N/A;   CARDIAC CATHETERIZATION N/A 04/15/2015   Procedure: Coronary Stent Intervention;  Surgeon: Freda Jaquith M [place], MD;  Location: [facility] INVASIVE CV LAB;  Service: Cardiovascular;  Laterality: N/A;   CARDIAC CATHETERIZATION N/A 05/06/2015   Procedure: Coronary Stent Intervention;  Surgeon: Damary Doland M [place], MD;  Location:  [facility] INVASIVE CV LAB;  Service: Cardiovascular;  Laterality: N/A;   CARDIAC CATHETERIZATION  05/06/2015   Procedure: Coronary/Graft Angiography;  Surgeon: Renada Cronin M [place], MD;  Location: [facility] INVASIVE CV LAB;  Service: Cardiovascular;;   COLONOSCOPY     CORONARY BALLOON ANGIOPLASTY N/A 07/13/2018   Procedure: CORONARY BALLOON ANGIOPLASTY;  Surgeon: Runell Gess, MD;  Location: [facility] INVASIVE CV LAB;  Service: Cardiovascular;  Laterality: N/A;   CORONARY BALLOON ANGIOPLASTY N/A 08/15/2020   Procedure: CORONARY BALLOON ANGIOPLASTY;  Surgeon: [place], Burton Gahan M, MD;  Location: [facility] INVASIVE CV LAB;  Service:  Cardiovascular;  Laterality: N/A;   CORONARY STENT INTERVENTION N/A 07/13/2018   Procedure: CORONARY STENT INTERVENTION;  Surgeon: Runell Gess, MD;  Location: [facility] INVASIVE CV LAB;  Service: Cardiovascular;  Laterality: N/A;   CORONARY STENT PLACEMENT  05/06/2015   LT CIRC DES X2   LEFT HEART CATH AND CORONARY ANGIOGRAPHY N/A 07/13/2018   Procedure: LEFT HEART CATH AND CORONARY ANGIOGRAPHY;  Surgeon: Runell Gess, MD;  Location: [facility] INVASIVE CV LAB;  Service: Cardiovascular;  Laterality: N/A;   LEFT HEART CATH AND CORONARY ANGIOGRAPHY N/A 08/15/2020   Procedure: LEFT HEART CATH AND CORONARY ANGIOGRAPHY;  Surgeon: [place], Cherina Dhillon M, MD;  Location: [facility] INVASIVE CV LAB;  Service: Cardiovascular;  Laterality: N/A;   POLYPECTOMY     SKIN CANCER EXCISION     right forearm/pre-cancerous/ 2 spots on left arm    Current Medications: Outpatient Medications Prior to Visit  Medication Sig Dispense Refill   acetaminophen (TYLENOL) 500 MG tablet Take 500-1,000 mg by mouth every 6 (six) hours as needed for mild pain or headache.     amLODipine (NORVASC) 5 MG tablet Take 1 tablet (5 mg total) by mouth daily. 90 tablet 3   aspirin 81 MG chewable tablet Chew 1 tablet (81 mg total) by mouth daily.     atorvastatin (LIPITOR) 80 MG tablet TAKE 1 TABLET BY MOUTH DAILY AT 6 PM. 90 tablet 3   celecoxib (CELEBREX) 200 MG capsule TAKE 1 CAPSULE BY MOUTH EVERY DAY 30 capsule 0   clopidogrel (PLAVIX) 75 MG tablet TAKE 1 TABLET BY MOUTH DAILY WITH BREAKFAST. 90 tablet 3   HYDROcodone bit-homatropine (HYCODAN) 5-1.5 MG/5ML syrup Take 5 mLs by mouth every 8 (eight) hours as needed for cough. 120 mL 0   lisinopril (ZESTRIL) 40 MG tablet TAKE 1 TABLET BY MOUTH EVERY DAY 90 tablet 3   nitroGLYCERIN (NITROSTAT) 0.4 MG SL tablet Place 1 tablet (0.4 mg total) under the tongue every 5 (five) minutes x 3 doses as needed for chest pain. 25 tablet 11   omega-3 acid ethyl esters (LOVAZA) 1 g capsule TAKE 2 CAPSULES BY MOUTH TWICE  A DAY 360 capsule 2   pantoprazole (PROTONIX) 40 MG tablet TAKE 1 TABLET BY MOUTH EVERY DAY BEFORE BREAKFAST 90 tablet 3   No facility-administered medications prior to visit.     Allergies:   Penicillins and Prednisone   Social History   Socioeconomic History   Marital status: Married    Spouse name: Not on file   Number of children: Not on file   Years of education: Not on file   Highest education level: Not on file  Occupational History   Not on file  Tobacco Use   Smoking status: Never   Smokeless tobacco: Current    Types: Chew   Tobacco comments:    chew every day  Vaping Use   Vaping Use: Never used  Substance and Sexual  Activity   Alcohol use: No    Alcohol/week: 0.0 standard drinks of alcohol   Drug use: No   Sexual activity: Not on file  Other Topics Concern   Not on file  Social History Narrative   Not on file   Social Determinants of Health   Financial Resource Strain: Not on file  Food Insecurity: Not on file  Transportation Needs: Not on file  Physical Activity: Not on file  Stress: Not on file  Social Connections: Not on file     Family History:  The patient's family history includes Brain cancer in his sister; Cancer in his brother and father; Colon cancer in his brother and father; Kidney cancer in his brother, sister, sister, sister, and sister; Stroke in his mother.   ROS:   Please see the history of present illness.    ROSAll other systems reviewed and are negative.   Physical Exam:    VS:  BP 138/75   Pulse (!) 44   Ht 5\' 5"  (1.651 m)   Wt 166 lb 6.4 oz (75.5 kg)   SpO2 96%   BMI 27.69 kg/m    GENERAL:  Well appearing WM in NAD HEENT:  PERRL, EOMI, sclera are clear. Oropharynx is clear. NECK:  No jugular venous distention, carotid upstroke brisk and symmetric, no bruits, no thyromegaly or adenopathy LUNGS:  Clear to auscultation bilaterally CHEST:  Unremarkable HEART:  RRR,  PMI not displaced or sustained,S1 and S2 within normal  limits, no S3, no S4: no clicks, no rubs, no murmurs ABD:  Soft, nontender. BS +, no masses or bruits. No hepatomegaly, no splenomegaly EXT:  2 + pulses throughout, no edema, no cyanosis no clubbing SKIN:  Warm and dry.  No rashes NEURO:  Alert and oriented x 3. Cranial nerves II through XII intact. PSYCH:  Cognitively intact      Wt Readings from Last 3 Encounters:  06/14/22 166 lb 6.4 oz (75.5 kg)  12/09/21 163 lb 12.8 oz (74.3 kg)  09/30/21 162 lb 1.6 oz (73.5 kg)      Studies/Labs Reviewed:     EKG:  EKG is  ordered today. Marked sinus brady rate 44 bpm. Nonspecific ST-T changes. I have personally reviewed and interpreted this study.     Recent Labs: 06/07/2022: ALT 42; BUN 19; Creatinine, Ser 0.91; Potassium 4.3; Sodium 142   Recent Lipid Panel    Component Value Date/Time   CHOL 104 06/07/2022 0900   TRIG 231 (H) 06/07/2022 0900   HDL 22 (L) 06/07/2022 0900   CHOLHDL 4.7 06/07/2022 0900   CHOLHDL 5 04/21/2020 0843   VLDL 74.8 (H) 04/21/2020 0843   LDLCALC 45 06/07/2022 0900   LDLDIRECT 44.0 04/21/2020 0843    Additional studies/ records that were reviewed today include:   Echo 07/13/18: IMPRESSIONS      1. The left ventricle has normal systolic function with an ejection fraction of 60-65%. The cavity size was normal. Left ventricular diastolic Doppler parameters are consistent with impaired relaxation. No evidence of left ventricular regional wall  motion abnormalities.  2. The right ventricle has normal systolic function. The cavity was normal. There is no increase in right ventricular wall thickness.    Cardiac cath/PCI 07/13/18: CORONARY BALLOON ANGIOPLASTY  CORONARY STENT INTERVENTION  LEFT HEART CATH AND CORONARY ANGIOGRAPHY  Conclusion    Prox RCA to Mid RCA lesion is 90% stenosed. Mid RCA lesion is 60% stenosed. Previously placed Ost Cx to Mid Cx stent (unknown type) is  widely patent. Prox LAD lesion is 50% stenosed. Ost 1st Diag to 1st Diag  lesion is 50% stenosed. Post intervention, there is a 25% residual stenosis. A stent was successfully placed. Post intervention, there is a 25% residual stenosis. The left ventricular systolic function is normal. LV end diastolic pressure is normal. The left ventricular ejection fraction is 50-55% by visual estimate.   Terry Rivers is a 72 y.o. male      [id number] LOCATION:  FACILITY: [facility]  PHYSICIAN: Nanetta Batty, M.D. 1950/07/23     DATE OF PROCEDURE:  07/13/2018   DATE OF DISCHARGE:        CARDIAC CATHETERIZATION / PCI DES RCA       History obtained from chart review.Terry Rivers is a 72 y.o. male with a hx of CAD s/p PCI to RCA; staged PCI to P LCx 04/2015, bradycardia, DM2 (diet controlled) and DJD who presented to [facility] on 07/12/2018 with chest pain and SOB.     PROCEDURE DESCRIPTION:    The patient was brought to the second floor [facility] Cardiac cath lab in the postabsorptive state. He was not premedicated . His right wrist was prepped and shaved in usual sterile fashion. Xylocaine 1% was used  for local anesthesia. A 6 French sheath was inserted into the right radial artery using standard Seldinger technique. The patient received 3500 units  of heparin intravenously.  A 5 [place] TIG catheter and pigtail catheters were used for selective coronary angiography and left ventriculography respectively.  Isovue dye was used for the entirety of the case.  Retrograde aorta, ventricular and pullback pressures were recorded.  Radial cocktail was administered via the SideArm sheath.   The patient received an additional 6500 units of heparin followed by 2000 units of heparin with an ACT in the high 200 range.  The patient received 600 mg of p.o. Plavix in addition to 20 mg of IV Pepcid.  Using a 6 [place] AL 0.75 guide catheter along with a whisper wire I was able to cross the proximal band and lesion with some difficulty ultimately placing the wire in the distal PLA branch.  I then  predilated the proximal lesion with a 2 oh balloon but was unable to pass a stent because of tortuosity.  I then placed a guide liner just proximal to the lesion and was able to pass a three 5 x 12 mm Synergy drug-eluting stent across the proximal in-stent restenosis deployed at 18 atm.  I then used a 04/20/2010 balloon to dilate the mid 50 to 60% lesion as well as post dilate the stent.  There was still mild "dog boning" within the Synergy stent in the proximal portion with residual 25% stenosis.  The guidewire and guide liner were removed and completion angiography was performed.  The guide catheter was removed from the body, the sheath was removed and a TR band was placed on the right wrist to achieve patent hemostasis.  The patient left lab in stable condition.     IMPRESSION: Successful balloon angioplasty of mid dominant RCA in-stent restenosis and restenting of the proximal RCA for high-grade in-stent restenosis with acceptable results.  The previously placed circumflex stent was widely open and the LAD had at most moderate disease unchanged from his prior angiogram 3 years ago.  His LV function was normal with a low LVEDP.  He will need dual antiplatelet platelet therapy uninterrupted for at least 12 months.   Nanetta Batty. MD, [facility] 07/13/2018 10:57 AM  CORONARY BALLOON ANGIOPLASTY  LEFT HEART CATH AND CORONARY ANGIOGRAPHY   Conclusion    Prox LAD lesion is 50% stenosed. Ost 1st Diag to 1st Diag lesion is 50% stenosed. Non-stenotic Ost Cx to Mid Cx lesion was previously treated. Mid RCA lesion is 35% stenosed. Prox RCA lesion is 90% stenosed. Post intervention, there is a 50% residual stenosis. Balloon angioplasty was performed using a BALLOON [place] EMERGE MR 4.5X8. The left ventricular systolic function is normal. LV end diastolic pressure is normal. The left ventricular ejection fraction is 55-65% by visual estimate.   1. Single vessel obstructive CAD with in stent restenosis  focal in the proximal RCA. This lesion has been stented twice before with stent underexpansion. 2. Normal LV function 3. Normal LVEDP 4. Partially successful PCI of the proximal RCA with Shock wave therapy and aggressive expansion with an oversized 4.5 mm [place] balloon. Lesion reduced to 50% but stents still not fully expanded.   Plan: same day DC. Continue DAPT indefinitely. RCA lesion is at high risk for restenosis. If he has refractory angina despite medical therapy in the future may need to be considered for CABG.      ASSESSMENT:     1. Coronary artery disease of native artery of native heart with stable angina pectoris (HCC)   2. Essential hypertension   3. Hyperlipidemia LDL goal <70      PLAN:     In order of problems listed above:  1. CAD - s/p inf STEMI in March 2017 tx with DES to RCA.  Staged PCI of the proximal to mid LCx with DES.  Recurrent symptoms in May 2020. S/p repeat PCI of the RCA with DES.  Continue ASA and Plavix indefinitely given multiple stents.  Continue  statin.   No beta blocker due to history of bradycardia.  S/p PCI of the proximal RCA with shock wave therapy and oversized [place] balloon. Residual 50% stenosis. Now with class 1 angina. Continue medical therapy   2. HTN - BP is adequately controlled.  3. HL - Continue statin.  LDL 45 at goal. Triglycerides elevated. Continue Stain and lovaza. Encourage dietary modification and weight control.     Medication Adjustments/Labs and Tests Ordered: Current medicines are reviewed at length with the patient today.  Concerns regarding medicines are outlined above.  Medication changes, Labs and Tests ordered today are outlined in the Patient Instructions noted below. There are no Patient Instructions on file for this visit.     Signed, Jacson Rapaport [place], MD  06/14/2022 8:34 AM    [facility]

## 2022-06-14 ENCOUNTER — Ambulatory Visit: Payer: Medicare Other | Attending: Cardiology | Admitting: Cardiology

## 2022-06-14 ENCOUNTER — Encounter: Payer: Self-pay | Admitting: Cardiology

## 2022-06-14 VITALS — BP 138/75 | HR 44 | Ht 65.0 in | Wt 166.4 lb

## 2022-06-14 DIAGNOSIS — I25118 Atherosclerotic heart disease of native coronary artery with other forms of angina pectoris: Secondary | ICD-10-CM | POA: Diagnosis not present

## 2022-06-14 DIAGNOSIS — I10 Essential (primary) hypertension: Secondary | ICD-10-CM | POA: Insufficient documentation

## 2022-06-14 DIAGNOSIS — E785 Hyperlipidemia, unspecified: Secondary | ICD-10-CM

## 2022-06-14 NOTE — Patient Instructions (Signed)
Medication Instructions:  Continue same medications *If you need a refill on your cardiac medications before your next appointment, please call your pharmacy*   Lab Work: None ordered   Testing/Procedures: None ordered   Follow-Up: At [facility], you and your health needs are our priority.  As part of our continuing mission to provide you with exceptional heart care, we have created designated Provider Care Teams.  These Care Teams include your primary Cardiologist (physician) and Advanced Practice Providers (APPs -  Physician Assistants and Nurse Practitioners) who all work together to provide you with the care you need, when you need it.  We recommend signing up for the patient portal called "MyChart".  Sign up information is provided on this After Visit Summary.  MyChart is used to connect with patients for Virtual Visits (Telemedicine).  Patients are able to view lab/test results, encounter notes, upcoming appointments, etc.  Non-urgent messages can be sent to your provider as well.   To learn more about what you can do with MyChart, go to [email or web address].    Your next appointment:  6 months    Call mid May to schedule Oct appointment     Provider:  Dr.Jordan

## 2022-06-24 ENCOUNTER — Telehealth: Payer: Self-pay | Admitting: Cardiology

## 2022-06-24 NOTE — Telephone Encounter (Signed)
Patient wife states patient seen in April and needs follow up for October. She was scheduled for November, but there was an opening in October and patient scheduled for that date instead.

## 2022-06-24 NOTE — Telephone Encounter (Signed)
Patient's wife would like to speak to Dr. Elvis Coil nurse Elnita Maxwell.  She is calling about his next scheduled appt.

## 2022-07-30 ENCOUNTER — Other Ambulatory Visit: Payer: Self-pay | Admitting: Cardiology

## 2022-09-30 ENCOUNTER — Other Ambulatory Visit: Payer: Self-pay | Admitting: Cardiology

## 2022-10-13 ENCOUNTER — Encounter: Payer: Self-pay | Admitting: Family Medicine

## 2022-10-15 ENCOUNTER — Encounter: Payer: Self-pay | Admitting: Family Medicine

## 2022-10-15 ENCOUNTER — Ambulatory Visit (INDEPENDENT_AMBULATORY_CARE_PROVIDER_SITE_OTHER): Payer: Medicare Other | Admitting: Family Medicine

## 2022-10-15 VITALS — BP 138/58 | HR 70 | Temp 97.6°F | Ht 65.0 in | Wt 165.1 lb

## 2022-10-15 DIAGNOSIS — I10 Essential (primary) hypertension: Secondary | ICD-10-CM

## 2022-10-15 DIAGNOSIS — E785 Hyperlipidemia, unspecified: Secondary | ICD-10-CM | POA: Diagnosis not present

## 2022-10-15 DIAGNOSIS — Z23 Encounter for immunization: Secondary | ICD-10-CM

## 2022-10-15 DIAGNOSIS — D126 Benign neoplasm of colon, unspecified: Secondary | ICD-10-CM | POA: Diagnosis not present

## 2022-10-15 DIAGNOSIS — L570 Actinic keratosis: Secondary | ICD-10-CM | POA: Diagnosis not present

## 2022-10-15 DIAGNOSIS — I2510 Atherosclerotic heart disease of native coronary artery without angina pectoris: Secondary | ICD-10-CM

## 2022-10-15 DIAGNOSIS — K219 Gastro-esophageal reflux disease without esophagitis: Secondary | ICD-10-CM

## 2022-10-15 NOTE — Progress Notes (Signed)
Established Patient Office Visit  Subjective   Patient ID: Terry Rivers, male    DOB: 11-10-1950  Age: 73 y.o. MRN: [id number]  No chief complaint on file.   HPI   Terry Rivers is seen for medical follow-up and requesting flu vaccine.  He has history of CAD.  Multivessel disease with prior stents.  He had ST elevation MI involving right coronary artery several years ago.  Other medical problems include hypertension and hyperlipidemia.  He has history of GERD treated with Protonix 40 mg daily.  He has not tried coming off this in quite some time.  No recent GERD symptoms.  No dysphagia.  He denies any recent chest pains.  He continues to stay active with outdoors activities including gardening.  Scaly lesion right forearm.  He has had actinic keratoses treated previously.  He is in the sun quite a bit.  History of colon adenomas.  Last colonoscopy was 12/19.  Denies any recent change in stool habits.  He had recent labs for cardiology including lipid panel and chemistries.  Past Medical History:  Diagnosis Date   Arthritis    Benign essential HTN    Bradycardia    low 50"s per wife   CAD, multiple vessel-native arteries, emergent PCI RCA with DES 04/15/15 2017   a. 03/2014- inferior STEMI with occluded RCA; b. 04/2015: Staged PCI of LCx c. 07/13/18 PCI to [facility] for ISR   Chicken pox    Diabetes mellitus without complication (HCC)    Borderline/no meds   Frequent headaches    GERD (gastroesophageal reflux disease)    Hyperlipidemia LDL goal <70 04/17/2015   Myocardial infarction (HCC) 03/2015   Tobacco use 04/17/2015   Unstable angina (HCC) 06/2018   Past Surgical History:  Procedure Laterality Date   CARDIAC CATHETERIZATION N/A 04/15/2015   Procedure: Left Heart Cath and Coronary Angiography;  Surgeon: Peter M [place], MD;  Location: [facility] INVASIVE CV LAB;  Service: Cardiovascular;  Laterality: N/A;   CARDIAC CATHETERIZATION N/A 04/15/2015   Procedure: Coronary Stent Intervention;  Surgeon:  Peter M [place], MD;  Location: [facility] INVASIVE CV LAB;  Service: Cardiovascular;  Laterality: N/A;   CARDIAC CATHETERIZATION N/A 05/06/2015   Procedure: Coronary Stent Intervention;  Surgeon: Peter M [place], MD;  Location: [facility] INVASIVE CV LAB;  Service: Cardiovascular;  Laterality: N/A;   CARDIAC CATHETERIZATION  05/06/2015   Procedure: Coronary/Graft Angiography;  Surgeon: Peter M [place], MD;  Location: [facility] INVASIVE CV LAB;  Service: Cardiovascular;;   COLONOSCOPY     CORONARY BALLOON ANGIOPLASTY N/A 07/13/2018   Procedure: CORONARY BALLOON ANGIOPLASTY;  Surgeon: Runell Gess, MD;  Location: [facility] INVASIVE CV LAB;  Service: Cardiovascular;  Laterality: N/A;   CORONARY BALLOON ANGIOPLASTY N/A 08/15/2020   Procedure: CORONARY BALLOON ANGIOPLASTY;  Surgeon: Swazil[place], Peter M, MD;  Location: [facility] INVASIVE CV LAB;  Service: Cardiovascular;  Laterality: N/A;   CORONARY STENT INTERVENTION N/A 07/13/2018   Procedure: CORONARY STENT INTERVENTION;  Surgeon: Runell Gess, MD;  Location: [facility] INVASIVE CV LAB;  Service: Cardiovascular;  Laterality: N/A;   CORONARY STENT PLACEMENT  05/06/2015   LT CIRC DES X2   LEFT HEART CATH AND CORONARY ANGIOGRAPHY N/A 07/13/2018   Procedure: LEFT HEART CATH AND CORONARY ANGIOGRAPHY;  Surgeon: Runell Gess, MD;  Location: [facility] INVASIVE CV LAB;  Service: Cardiovascular;  Laterality: N/A;   LEFT HEART CATH AND CORONARY ANGIOGRAPHY N/A 08/15/2020   Procedure: LEFT HEART CATH AND CORONARY ANGIOGRAPHY;  Surgeon: Swaziland, Peter M, MD;  Location: [facility] INVASIVE CV LAB;  Service: Cardiovascular;  Laterality: N/A;   POLYPECTOMY     SKIN CANCER EXCISION     right forearm/pre-cancerous/ 2 spots on left arm    reports that he has never smoked. His smokeless tobacco use includes chew. He reports that he does not drink alcohol and does not use drugs. family history includes Brain cancer in his sister; Cancer in his brother and father; Colon cancer in his brother and father; Kidney cancer in his  brother, sister, sister, sister, and sister; Stroke in his mother. Allergies  Allergen Reactions   Penicillins Anaphylaxis, Shortness Of Breath and Swelling    Joints became swollen /turned purple Did it involve swelling of the face/tongue/throat, SOB, or low BP? No Did it involve sudden or severe rash/hives, skin peeling, or any reaction on the inside of your mouth or nose? No Did you need to seek medical attention at a hospital or doctor's office? Yes When did it last happen? "Many years ago"    If all above answers are "NO", may proceed with cephalosporin use.     Prednisone Swelling and Other (See Comments)    Made the patient's joints swell to the point where he could not walk    Review of Systems  Constitutional:  Negative for chills, fever, malaise/fatigue and weight loss.  Eyes:  Negative for blurred vision.  Respiratory:  Negative for shortness of breath.   Cardiovascular:  Negative for chest pain.  Neurological:  Negative for dizziness, weakness and headaches.      Objective:     BP (!) 138/58 (BP Location: Left Arm, Cuff Size: Normal)   Pulse 70   Temp 97.6 F (36.4 C) (Oral)   Ht 5\' 5"  (1.651 m)   Wt 165 lb 1.6 oz (74.9 kg)   SpO2 98%   BMI 27.47 kg/m  BP Readings from Last 3 Encounters:  10/15/22 (!) 138/58  06/14/22 138/75  12/09/21 132/74   Wt Readings from Last 3 Encounters:  10/15/22 165 lb 1.6 oz (74.9 kg)  06/14/22 166 lb 6.4 oz (75.5 kg)  12/09/21 163 lb 12.8 oz (74.3 kg)      Physical Exam Vitals reviewed.  Constitutional:      Appearance: Normal appearance.  Cardiovascular:     Rate and Rhythm: Normal rate and regular rhythm.  Pulmonary:     Effort: Pulmonary effort is normal.     Breath sounds: Normal breath sounds.  Musculoskeletal:     Right lower leg: No edema.     Left lower leg: No edema.  Skin:    Comments: Approximately 4 x 6 mm scaly slightly raised area right lateral forearm.  No ulceration.  Neurological:     Mental  Status: He is alert.      No results found for any visits on 10/15/22.  Last CBC Lab Results  Component Value Date   WBC 6.8 08/08/2020   HGB 15.2 08/08/2020   HCT 44.5 08/08/2020   MCV 92 08/08/2020   MCH 31.3 08/08/2020   RDW 13.5 08/08/2020   PLT 244 08/08/2020   Last metabolic panel Lab Results  Component Value Date   GLUCOSE 108 (H) 06/07/2022   NA 142 06/07/2022   K 4.3 06/07/2022   CL 104 06/07/2022   CO2 20 06/07/2022   BUN 19 06/07/2022   CREATININE 0.91 06/07/2022   EGFR 88 06/07/2022   CALCIUM 9.9 06/07/2022   PROT 7.6 06/07/2022   ALBUMIN 4.4 06/07/2022   LABGLOB  2.7 10/13/2017   AGRATIO 1.6 10/13/2017   BILITOT 0.5 06/07/2022   ALKPHOS 46 06/07/2022   AST 33 06/07/2022   ALT 42 06/07/2022   ANIONGAP 8 07/14/2018   Last lipids Lab Results  Component Value Date   CHOL 104 06/07/2022   HDL 22 (L) 06/07/2022   LDLCALC 45 06/07/2022   LDLDIRECT 44.0 04/21/2020   TRIG 231 (H) 06/07/2022   CHOLHDL 4.7 06/07/2022      The ASCVD Risk score (Arnett DK, et al., 2019) failed to calculate for the following reasons:   The patient has a prior MI or stroke diagnosis    Assessment & Plan:   #1 hypertension treated with amlodipine and lisinopril.  Blood pressure slightly up today but generally well-controlled by home readings.  Continue to monitor.  Continue low-sodium diet.  #2 history of CAD followed by cardiology.  Patient on high-dose statin along with Plavix.  No recent chest pains.  #3 history of colon adenomas.  This December makes 5 years from previous colonoscopy.  He is reminded to be sure to follow through with repeat colonoscopy  #4 actinic keratosis right forearm.  Discussed risk of treatment including pain, blistering, low risk of infection.  Patient consented.  Treated with liquid nitrogen patient tolerated well  #5 history of GERD.  Controlled Protonix 40 mg daily.  Gave him option of trying to taper off with suggested tapering  regimen.  #6 health maintenance.  High-dose flu vaccine given.  Pneumonia vaccines already complete Evelena Peat, MD

## 2022-10-15 NOTE — Patient Instructions (Signed)
Remember to get follow up colonoscopy by later this year (12/24 makes one year)

## 2022-10-15 NOTE — Addendum Note (Signed)
Addended by: Christy Sartorius on: 10/15/2022 01:38 PM   Modules accepted: Orders

## 2022-10-19 DIAGNOSIS — Z23 Encounter for immunization: Secondary | ICD-10-CM | POA: Diagnosis not present

## 2022-10-19 NOTE — Addendum Note (Signed)
Addended by: Christy Sartorius on: 10/19/2022 10:24 AM   Modules accepted: Orders

## 2022-10-29 ENCOUNTER — Other Ambulatory Visit: Payer: Self-pay | Admitting: Cardiology

## 2022-11-11 NOTE — Progress Notes (Signed)
Cardiology Office Note:    Date:  11/19/2022   ID:  Terry Rivers, DOB 13-Apr-1950, MRN [id number]  PCP:  Kristian Covey, MD  Cardiologist:  Dr. Emin Foree [place]    Chief Complaint  Patient presents with   Coronary Artery Disease    History of Present Illness:     Terry Rivers is a 72 y.o. male with a hx of CAD, bradycardia, DM (diet controlled), DJD who is seen as a work in for evaluation of chest pain.   Admitted 2/28-04/17/15 with inferior STEMI.  LHC demonstrated multivessel CAD with occluded RCA as the culprit. RCA was treated with DES.  He has residual LAD/Dx and LCx disease. He was not started on beta-blocker due to bradycardia. On 05/06/15 he underwent staged PCI of the proximal to mid LCx with a DES. The mid LAD had a 60% stenosis with FFR of 0.9 and was  treated medically.  Has done very well until March 2020 when he began experiencing intermittent exertional chest pain and dyspnea.  He was admitted in May and had repeat cardiac cath showing restenosis in the RCA treated with DES. Stents in the  LCx still patent.  LAD disease unchanged. For BP control his lisinopril dose was increased and amlodipine added.   He was seen on 08/05/20 with  increasing episodes of angina.   On 08/15/2020 underwent cardiac catheterization showing single-vessel obstructive CAD with in-stent restenosis focal in the proximal RCA.  Of note lesion has been stented twice before with  stent underexpansion.  He underwent partially successful PCI of proximal RCA with Shock wave therapy and aggressive expansion with oversized 4.5 [place] balloon with lesion reduced to 50% but stent still not fully expanded.  He was recommended for continued indefinite DAPT.  RCA lesion noted to be high risk for restenosis and if he has refractory angina despite medical therapy in the future may need to be considered for CABG.   Patient states he is doing well. Has been walking daily. Doing a lot of mowing and gardening. He denies any chest  pain. Only gets SOB if pulling trash can out to road a long way. Typically BP at home 130/70. Overall feels well.     Past Medical History:  Diagnosis Date   Arthritis    Benign essential HTN    Bradycardia    low 50"s per wife   CAD, multiple vessel-native arteries, emergent PCI RCA with DES 04/15/15 2017   a. 03/2014- inferior STEMI with occluded RCA; b. 04/2015: Staged PCI of LCx c. 07/13/18 PCI to [facility] for ISR   Chicken pox    Diabetes mellitus without complication (HCC)    Borderline/no meds   Frequent headaches    GERD (gastroesophageal reflux disease)    Hyperlipidemia LDL goal <70 04/17/2015   Myocardial infarction (HCC) 03/2015   Tobacco use 04/17/2015   Unstable angina (HCC) 06/2018    Past Surgical History:  Procedure Laterality Date   CARDIAC CATHETERIZATION N/A 04/15/2015   Procedure: Left Heart Cath and Coronary Angiography;  Surgeon: Miyoshi Ligas M [place], MD;  Location: [facility] INVASIVE CV LAB;  Service: Cardiovascular;  Laterality: N/A;   CARDIAC CATHETERIZATION N/A 04/15/2015   Procedure: Coronary Stent Intervention;  Surgeon: Eldean Nanna M [place], MD;  Location: [facility] INVASIVE CV LAB;  Service: Cardiovascular;  Laterality: N/A;   CARDIAC CATHETERIZATION N/A 05/06/2015   Procedure: Coronary Stent Intervention;  Surgeon: Lakendra Helling M [place], MD;  Location: [facility] INVASIVE CV LAB;  Service: Cardiovascular;  Laterality: N/A;  CARDIAC CATHETERIZATION  05/06/2015   Procedure: Coronary/Graft Angiography;  Surgeon: Gisell Buehrle M [place], MD;  Location: [facility] INVASIVE CV LAB;  Service: Cardiovascular;;   COLONOSCOPY     CORONARY BALLOON ANGIOPLASTY N/A 07/13/2018   Procedure: CORONARY BALLOON ANGIOPLASTY;  Surgeon: Runell Gess, MD;  Location: [facility] INVASIVE CV LAB;  Service: Cardiovascular;  Laterality: N/A;   CORONARY BALLOON ANGIOPLASTY N/A 08/15/2020   Procedure: CORONARY BALLOON ANGIOPLASTY;  Surgeon: [place], Nhyira Leano M, MD;  Location: [facility] INVASIVE CV LAB;  Service: Cardiovascular;  Laterality: N/A;   CORONARY STENT  INTERVENTION N/A 07/13/2018   Procedure: CORONARY STENT INTERVENTION;  Surgeon: Runell Gess, MD;  Location: [facility] INVASIVE CV LAB;  Service: Cardiovascular;  Laterality: N/A;   CORONARY STENT PLACEMENT  05/06/2015   LT CIRC DES X2   LEFT HEART CATH AND CORONARY ANGIOGRAPHY N/A 07/13/2018   Procedure: LEFT HEART CATH AND CORONARY ANGIOGRAPHY;  Surgeon: Runell Gess, MD;  Location: [facility] INVASIVE CV LAB;  Service: Cardiovascular;  Laterality: N/A;   LEFT HEART CATH AND CORONARY ANGIOGRAPHY N/A 08/15/2020   Procedure: LEFT HEART CATH AND CORONARY ANGIOGRAPHY;  Surgeon: [place], Nevin Grizzle M, MD;  Location: [facility] INVASIVE CV LAB;  Service: Cardiovascular;  Laterality: N/A;   POLYPECTOMY     SKIN CANCER EXCISION     right forearm/pre-cancerous/ 2 spots on left arm    Current Medications: Outpatient Medications Prior to Visit  Medication Sig Dispense Refill   acetaminophen (TYLENOL) 500 MG tablet Take 500-1,000 mg by mouth every 6 (six) hours as needed for mild pain or headache.     amLODipine (NORVASC) 5 MG tablet Take 1 tablet (5 mg total) by mouth daily. 90 tablet 3   aspirin 81 MG chewable tablet Chew 1 tablet (81 mg total) by mouth daily.     atorvastatin (LIPITOR) 80 MG tablet TAKE 1 TABLET BY MOUTH DAILY AT 6 PM. 90 tablet 3   celecoxib (CELEBREX) 200 MG capsule TAKE 1 CAPSULE BY MOUTH EVERY DAY 30 capsule 0   clopidogrel (PLAVIX) 75 MG tablet TAKE 1 TABLET BY MOUTH EVERY DAY WITH BREAKFAST 90 tablet 3   lisinopril (ZESTRIL) 40 MG tablet TAKE 1 TABLET BY MOUTH EVERY DAY 90 tablet 3   nitroGLYCERIN (NITROSTAT) 0.4 MG SL tablet Place 1 tablet (0.4 mg total) under the tongue every 5 (five) minutes x 3 doses as needed for chest pain. 25 tablet 11   omega-3 acid ethyl esters (LOVAZA) 1 g capsule TAKE 2 CAPSULES BY MOUTH TWICE A DAY 360 capsule 2   pantoprazole (PROTONIX) 40 MG tablet TAKE 1 TABLET BY MOUTH EVERY DAY BEFORE BREAKFAST 90 tablet 3   No facility-administered medications prior to visit.      Allergies:   Penicillins and Prednisone   Social History   Socioeconomic History   Marital status: Married    Spouse name: Not on file   Number of children: Not on file   Years of education: Not on file   Highest education level: Not on file  Occupational History   Not on file  Tobacco Use   Smoking status: Never   Smokeless tobacco: Current    Types: Chew   Tobacco comments:    chew every day  Vaping Use   Vaping status: Never Used  Substance and Sexual Activity   Alcohol use: No    Alcohol/week: 0.0 standard drinks of alcohol   Drug use: No   Sexual activity: Not on file  Other Topics Concern   Not on  file  Social History Narrative   Not on file   Social Determinants of Health   Financial Resource Strain: Not on file  Food Insecurity: Not on file  Transportation Needs: Not on file  Physical Activity: Not on file  Stress: Not on file  Social Connections: Not on file     Family History:  The patient's family history includes Brain cancer in his sister; Cancer in his brother and father; Colon cancer in his brother and father; Kidney cancer in his brother, sister, sister, sister, and sister; Stroke in his mother.   ROS:   Please see the history of present illness.    ROSAll other systems reviewed and are negative.   Physical Exam:    VS:  BP (!) 156/80 (BP Location: Right Arm)   Pulse (!) 58   Ht 5\' 5"  (1.651 m)   Wt 164 lb (74.4 kg)   SpO2 97%   BMI 27.29 kg/m    GENERAL:  Well appearing WM in NAD HEENT:  PERRL, EOMI, sclera are clear. Oropharynx is clear. NECK:  No jugular venous distention, carotid upstroke brisk and symmetric, no bruits, no thyromegaly or adenopathy LUNGS:  Clear to auscultation bilaterally CHEST:  Unremarkable HEART:  RRR,  PMI not displaced or sustained,S1 and S2 within normal limits, no S3, no S4: no clicks, no rubs, no murmurs ABD:  Soft, nontender. BS +, no masses or bruits. No hepatomegaly, no splenomegaly EXT:  2 + pulses  throughout, no edema, no cyanosis no clubbing SKIN:  Warm and dry.  No rashes NEURO:  Alert and oriented x 3. Cranial nerves II through XII intact. PSYCH:  Cognitively intact      Wt Readings from Last 3 Encounters:  11/19/22 164 lb (74.4 kg)  10/15/22 165 lb 1.6 oz (74.9 kg)  06/14/22 166 lb 6.4 oz (75.5 kg)      Studies/Labs Reviewed:     EKG:  EKG is not ordered today.    Recent Labs: 06/07/2022: ALT 42; BUN 19; Creatinine, Ser 0.91; Potassium 4.3; Sodium 142   Recent Lipid Panel    Component Value Date/Time   CHOL 104 06/07/2022 0900   TRIG 231 (H) 06/07/2022 0900   HDL 22 (L) 06/07/2022 0900   CHOLHDL 4.7 06/07/2022 0900   CHOLHDL 5 04/21/2020 0843   VLDL 74.8 (H) 04/21/2020 0843   LDLCALC 45 06/07/2022 0900   LDLDIRECT 44.0 04/21/2020 0843    Additional studies/ records that were reviewed today include:   Echo 07/13/18: IMPRESSIONS      1. The left ventricle has normal systolic function with an ejection fraction of 60-65%. The cavity size was normal. Left ventricular diastolic Doppler parameters are consistent with impaired relaxation. No evidence of left ventricular regional wall  motion abnormalities.  2. The right ventricle has normal systolic function. The cavity was normal. There is no increase in right ventricular wall thickness.    Cardiac cath/PCI 07/13/18: CORONARY BALLOON ANGIOPLASTY  CORONARY STENT INTERVENTION  LEFT HEART CATH AND CORONARY ANGIOGRAPHY  Conclusion    Prox RCA to Mid RCA lesion is 90% stenosed. Mid RCA lesion is 60% stenosed. Previously placed Ost Cx to Mid Cx stent (unknown type) is widely patent. Prox LAD lesion is 50% stenosed. Ost 1st Diag to 1st Diag lesion is 50% stenosed. Post intervention, there is a 25% residual stenosis. A stent was successfully placed. Post intervention, there is a 25% residual stenosis. The left ventricular systolic function is normal. LV end diastolic pressure is normal.  The left ventricular  ejection fraction is 50-55% by visual estimate.   Terry Rivers is a 72 y.o. male      [id number] LOCATION:  FACILITY: [facility]  PHYSICIAN: Nanetta Batty, M.D. June 08, 1950     DATE OF PROCEDURE:  07/13/2018   DATE OF DISCHARGE:        CARDIAC CATHETERIZATION / PCI DES RCA       History obtained from chart review.FRANDY BASNETT is a 72 y.o. male with a hx of CAD s/p PCI to RCA; staged PCI to P LCx 04/2015, bradycardia, DM2 (diet controlled) and DJD who presented to [facility] on 07/12/2018 with chest pain and SOB.     PROCEDURE DESCRIPTION:    The patient was brought to the second floor [facility] cath lab in the postabsorptive state. He was not premedicated . His right wrist was prepped and shaved in usual sterile fashion. Xylocaine 1% was used  for local anesthesia. A 6 French sheath was inserted into the right radial artery using standard Seldinger technique. The patient received 3500 units  of heparin intravenously.  A 5 [place] TIG catheter and pigtail catheters were used for selective coronary angiography and left ventriculography respectively.  Isovue dye was used for the entirety of the case.  Retrograde aorta, ventricular and pullback pressures were recorded.  Radial cocktail was administered via the SideArm sheath.   The patient received an additional 6500 units of heparin followed by 2000 units of heparin with an ACT in the high 200 range.  The patient received 600 mg of p.o. Plavix in addition to 20 mg of IV Pepcid.  Using a 6 [place] AL 0.75 guide catheter along with a whisper wire I was able to cross the proximal band and lesion with some difficulty ultimately placing the wire in the distal PLA branch.  I then predilated the proximal lesion with a 2 oh balloon but was unable to pass a stent because of tortuosity.  I then placed a guide liner just proximal to the lesion and was able to pass a three 5 x 12 mm Synergy drug-eluting stent across the proximal in-stent restenosis deployed at  18 atm.  I then used a 04/20/2010 balloon to dilate the mid 50 to 60% lesion as well as post dilate the stent.  There was still mild "dog boning" within the Synergy stent in the proximal portion with residual 25% stenosis.  The guidewire and guide liner were removed and completion angiography was performed.  The guide catheter was removed from the body, the sheath was removed and a TR band was placed on the right wrist to achieve patent hemostasis.  The patient left lab in stable condition.     IMPRESSION: Successful balloon angioplasty of mid dominant RCA in-stent restenosis and restenting of the proximal RCA for high-grade in-stent restenosis with acceptable results.  The previously placed circumflex stent was widely open and the LAD had at most moderate disease unchanged from his prior angiogram 3 years ago.  His LV function was normal with a low LVEDP.  He will need dual antiplatelet platelet therapy uninterrupted for at least 12 months.   Nanetta Batty. MD, [facility] 07/13/2018 10:57 AM        CORONARY BALLOON ANGIOPLASTY  LEFT HEART CATH AND CORONARY ANGIOGRAPHY   Conclusion    Prox LAD lesion is 50% stenosed. Ost 1st Diag to 1st Diag lesion is 50% stenosed. Non-stenotic Ost Cx to Mid Cx lesion was previously treated. Mid RCA lesion is 35%  stenosed. Prox RCA lesion is 90% stenosed. Post intervention, there is a 50% residual stenosis. Balloon angioplasty was performed using a BALLOON [place] EMERGE MR 4.5X8. The left ventricular systolic function is normal. LV end diastolic pressure is normal. The left ventricular ejection fraction is 55-65% by visual estimate.   1. Single vessel obstructive CAD with in stent restenosis focal in the proximal RCA. This lesion has been stented twice before with stent underexpansion. 2. Normal LV function 3. Normal LVEDP 4. Partially successful PCI of the proximal RCA with Shock wave therapy and aggressive expansion with an oversized 4.5 mm [place] balloon. Lesion  reduced to 50% but stents still not fully expanded.   Plan: same day DC. Continue DAPT indefinitely. RCA lesion is at high risk for restenosis. If he has refractory angina despite medical therapy in the future may need to be considered for CABG.      ASSESSMENT:     1. Coronary artery disease of native artery of native heart with stable angina pectoris (HCC)   2. Essential hypertension   3. Hyperlipidemia LDL goal <70       PLAN:     In order of problems listed above:  1. CAD - s/p inf STEMI in March 2017 tx with DES to RCA.  Staged PCI of the proximal to mid LCx with DES.  Recurrent symptoms in May 2020. S/p repeat PCI of the RCA with DES.  Continue ASA and Plavix indefinitely given multiple stents.  Continue  statin.   No beta blocker due to history of bradycardia.  S/p PCI of the proximal RCA with shock wave therapy and oversized [place] balloon. Residual 50% stenosis. Now with class 1 angina. Continue medical therapy   2. HTN - BP is elevated today but has been well controlled on other visits this past year. I asked him to monitor at home and let me know if it stays elevated.   3. HL - Continue statin.  LDL 45 at goal. Triglycerides elevated. Continue Stain and lovaza. Encourage dietary modification and weight control.   Follow up in 6 months  Medication Adjustments/Labs and Tests Ordered: Current medicines are reviewed at length with the patient today.  Concerns regarding medicines are outlined above.  Medication changes, Labs and Tests ordered today are outlined in the Patient Instructions noted below. There are no Patient Instructions on file for this visit.     Signed, Kayleigh Broadwell [place], MD  11/19/2022 8:31 AM    [facility]

## 2022-11-19 ENCOUNTER — Encounter: Payer: Self-pay | Admitting: Cardiology

## 2022-11-19 ENCOUNTER — Ambulatory Visit: Payer: Medicare Other | Attending: Cardiology | Admitting: Cardiology

## 2022-11-19 VITALS — BP 156/80 | HR 58 | Ht 65.0 in | Wt 164.0 lb

## 2022-11-19 DIAGNOSIS — I10 Essential (primary) hypertension: Secondary | ICD-10-CM | POA: Diagnosis present

## 2022-11-19 DIAGNOSIS — I25118 Atherosclerotic heart disease of native coronary artery with other forms of angina pectoris: Secondary | ICD-10-CM | POA: Diagnosis present

## 2022-11-19 DIAGNOSIS — E785 Hyperlipidemia, unspecified: Secondary | ICD-10-CM

## 2022-11-19 NOTE — Patient Instructions (Signed)
Medication Instructions:  Continue all medications *If you need a refill on your cardiac medications before your next appointment, please call your pharmacy*   Lab Work: None ordered   Testing/Procedures: None ordered   Follow-Up: At [facility], you and your health needs are our priority.  As part of our continuing mission to provide you with exceptional heart care, we have created designated Provider Care Teams.  These Care Teams include your primary Cardiologist (physician) and Advanced Practice Providers (APPs -  Physician Assistants and Nurse Practitioners) who all work together to provide you with the care you need, when you need it.  We recommend signing up for the patient portal called "MyChart".  Sign up information is provided on this After Visit Summary.  MyChart is used to connect with patients for Virtual Visits (Telemedicine).  Patients are able to view lab/test results, encounter notes, upcoming appointments, etc.  Non-urgent messages can be sent to your provider as well.   To learn more about what you can do with MyChart, go to [email or web address].    Your next appointment:  6 months     Call in Jan to schedule April appointment   Provider:  Dr.Jordan

## 2022-12-16 ENCOUNTER — Encounter: Payer: Self-pay | Admitting: Gastroenterology

## 2022-12-21 ENCOUNTER — Encounter: Payer: Self-pay | Admitting: Gastroenterology

## 2022-12-25 ENCOUNTER — Other Ambulatory Visit: Payer: Self-pay | Admitting: Cardiology

## 2023-01-07 ENCOUNTER — Ambulatory Visit: Payer: Medicare Other | Admitting: Cardiology

## 2023-02-03 ENCOUNTER — Other Ambulatory Visit: Payer: Self-pay | Admitting: Cardiology

## 2023-05-22 NOTE — Progress Notes (Unsigned)
 Cardiology Office Note:    Date:  05/22/2023   ID:  TAVIN VERNET, DOB 11/19/1950, MRN [id number]  PCP:  Kristian Covey, MD  Cardiologist:  Dr. Colletta Spillers [place]    No chief complaint on file.   History of Present Illness:     Terry Rivers is a 73 y.o. male with a hx of CAD, bradycardia, DM (diet controlled), DJD who is seen as a work in for evaluation of chest pain.   Admitted 2/28-04/17/15 with inferior STEMI.  LHC demonstrated multivessel CAD with occluded RCA as the culprit. RCA was treated with DES.  He has residual LAD/Dx and LCx disease. He was not started on beta-blocker due to bradycardia. On 05/06/15 he underwent staged PCI of the proximal to mid LCx with a DES. The mid LAD had a 60% stenosis with FFR of 0.9 and was  treated medically.  Has done very well until March 2020 when he began experiencing intermittent exertional chest pain and dyspnea.  He was admitted in May and had repeat cardiac cath showing restenosis in the RCA treated with DES. Stents in the  LCx still patent.  LAD disease unchanged. For BP control his lisinopril dose was increased and amlodipine added.   He was seen on 08/05/20 with  increasing episodes of angina.   On 08/15/2020 underwent cardiac catheterization showing single-vessel obstructive CAD with in-stent restenosis focal in the proximal RCA.  Of note lesion has been stented twice before with  stent underexpansion.  He underwent partially successful PCI of proximal RCA with Shock wave therapy and aggressive expansion with oversized 4.5 [place] balloon with lesion reduced to 50% but stent still not fully expanded.  He was recommended for continued indefinite DAPT.  RCA lesion noted to be high risk for restenosis and if he has refractory angina despite medical therapy in the future may need to be considered for CABG.   Patient states he is doing well. Has been walking daily. Doing a lot of mowing and gardening. He denies any chest pain. Only gets SOB if pulling trash can  out to road a long way. Typically BP at home 130/70. Overall feels well.     Past Medical History:  Diagnosis Date   Arthritis    Benign essential HTN    Bradycardia    low 50"s per wife   CAD, multiple vessel-native arteries, emergent PCI RCA with DES 04/15/15 2017   a. 03/2014- inferior STEMI with occluded RCA; b. 04/2015: Staged PCI of LCx c. 07/13/18 PCI to [facility] for ISR   Chicken pox    Diabetes mellitus without complication (HCC)    Borderline/no meds   Frequent headaches    GERD (gastroesophageal reflux disease)    Hyperlipidemia LDL goal <70 04/17/2015   Myocardial infarction (HCC) 03/2015   Tobacco use 04/17/2015   Unstable angina (HCC) 06/2018    Past Surgical History:  Procedure Laterality Date   CARDIAC CATHETERIZATION N/A 04/15/2015   Procedure: Left Heart Cath and Coronary Angiography;  Surgeon: Nykeria Mealing M [place], MD;  Location[facility] INVASIVE CV LAB;  Service: Cardiovascular;  Laterality: N/A;   CARDIAC CATHETERIZATION N/A 04/15/2015   Procedure: Coronary Stent Intervention;  Surgeon: Shourya Macpherson M [place], MD;  Location: [facility] INVASIVE CV LAB;  Service: Cardiovascular;  Laterality: N/A;   CARDIAC CATHETERIZATION N/A 05/06/2015   Procedure: Coronary Stent Intervention;  Surgeon: Dale Ribeiro M [place], MD;  Location: [facility] INVASIVE CV LAB;  Service: Cardiovascular;  Laterality: N/A;   CARDIAC CATHETERIZATION  05/06/2015  Procedure: Coronary/Graft Angiography;  Surgeon: Gianni Mihalik M [place], MD;  Location: [facility] INVASIVE CV LAB;  Service: Cardiovascular;;   COLONOSCOPY     CORONARY BALLOON ANGIOPLASTY N/A 07/13/2018   Procedure: CORONARY BALLOON ANGIOPLASTY;  Surgeon: Runell Gess, MD;  Location: [facility] INVASIVE CV LAB;  Service: Cardiovascular;  Laterality: N/A;   CORONARY BALLOON ANGIOPLASTY N/A 08/15/2020   Procedure: CORONARY BALLOON ANGIOPLASTY;  Surgeon: [place], Nyelli Samara M, MD;  Location: [facility] INVASIVE CV LAB;  Service: Cardiovascular;  Laterality: N/A;   CORONARY STENT INTERVENTION N/A 07/13/2018   Procedure:  CORONARY STENT INTERVENTION;  Surgeon: Runell Gess, MD;  Location: [facility] INVASIVE CV LAB;  Service: Cardiovascular;  Laterality: N/A;   CORONARY STENT PLACEMENT  05/06/2015   LT CIRC DES X2   LEFT HEART CATH AND CORONARY ANGIOGRAPHY N/A 07/13/2018   Procedure: LEFT HEART CATH AND CORONARY ANGIOGRAPHY;  Surgeon: Runell Gess, MD;  Location: [facility] INVASIVE CV LAB;  Service: Cardiovascular;  Laterality: N/A;   LEFT HEART CATH AND CORONARY ANGIOGRAPHY N/A 08/15/2020   Procedure: LEFT HEART CATH AND CORONARY ANGIOGRAPHY;  Surgeon: [place], Kwinton Maahs M, MD;  Location: [facility] INVASIVE CV LAB;  Service: Cardiovascular;  Laterality: N/A;   POLYPECTOMY     SKIN CANCER EXCISION     right forearm/pre-cancerous/ 2 spots on left arm    Current Medications: Outpatient Medications Prior to Visit  Medication Sig Dispense Refill   acetaminophen (TYLENOL) 500 MG tablet Take 500-1,000 mg by mouth every 6 (six) hours as needed for mild pain or headache.     amLODipine (NORVASC) 5 MG tablet TAKE 1 TABLET (5 MG TOTAL) BY MOUTH DAILY. 90 tablet 3   aspirin 81 MG chewable tablet Chew 1 tablet (81 mg total) by mouth daily.     atorvastatin (LIPITOR) 80 MG tablet TAKE 1 TABLET BY MOUTH DAILY AT 6 PM. 90 tablet 2   celecoxib (CELEBREX) 200 MG capsule TAKE 1 CAPSULE BY MOUTH EVERY DAY 30 capsule 0   clopidogrel (PLAVIX) 75 MG tablet TAKE 1 TABLET BY MOUTH EVERY DAY WITH BREAKFAST 90 tablet 3   lisinopril (ZESTRIL) 40 MG tablet TAKE 1 TABLET BY MOUTH EVERY DAY 90 tablet 3   nitroGLYCERIN (NITROSTAT) 0.4 MG SL tablet Place 1 tablet (0.4 mg total) under the tongue every 5 (five) minutes x 3 doses as needed for chest pain. 25 tablet 11   omega-3 acid ethyl esters (LOVAZA) 1 g capsule TAKE 2 CAPSULES BY MOUTH TWICE A DAY 360 capsule 2   pantoprazole (PROTONIX) 40 MG tablet TAKE 1 TABLET BY MOUTH EVERY DAY BEFORE BREAKFAST 90 tablet 3   No facility-administered medications prior to visit.     Allergies:   Penicillins and  Prednisone   Social History   Socioeconomic History   Marital status: Married    Spouse name: Not on file   Number of children: Not on file   Years of education: Not on file   Highest education level: Not on file  Occupational History   Not on file  Tobacco Use   Smoking status: Never   Smokeless tobacco: Current    Types: Chew   Tobacco comments:    chew every day  Vaping Use   Vaping status: Never Used  Substance and Sexual Activity   Alcohol use: No    Alcohol/week: 0.0 standard drinks of alcohol   Drug use: No   Sexual activity: Not on file  Other Topics Concern   Not on file  Social History Narrative  Not on file   Social Drivers of Health   Financial Resource Strain: Not on file  Food Insecurity: Not on file  Transportation Needs: Not on file  Physical Activity: Not on file  Stress: Not on file  Social Connections: Not on file     Family History:  The patient's family history includes Brain cancer in his sister; Cancer in his brother and father; Colon cancer in his brother and father; Kidney cancer in his brother, sister, sister, sister, and sister; Stroke in his mother.   ROS:   Please see the history of present illness.    ROSAll other systems reviewed and are negative.   Physical Exam:    VS:  There were no vitals taken for this visit.   GENERAL:  Well appearing WM in NAD HEENT:  PERRL, EOMI, sclera are clear. Oropharynx is clear. NECK:  No jugular venous distention, carotid upstroke brisk and symmetric, no bruits, no thyromegaly or adenopathy LUNGS:  Clear to auscultation bilaterally CHEST:  Unremarkable HEART:  RRR,  PMI not displaced or sustained,S1 and S2 within normal limits, no S3, no S4: no clicks, no rubs, no murmurs ABD:  Soft, nontender. BS +, no masses or bruits. No hepatomegaly, no splenomegaly EXT:  2 + pulses throughout, no edema, no cyanosis no clubbing SKIN:  Warm and dry.  No rashes NEURO:  Alert and oriented x 3. Cranial nerves II  through XII intact. PSYCH:  Cognitively intact      Wt Readings from Last 3 Encounters:  11/19/22 164 lb (74.4 kg)  10/15/22 165 lb 1.6 oz (74.9 kg)  06/14/22 166 lb 6.4 oz (75.5 kg)      Studies/Labs Reviewed:     EKG:  EKG is not ordered today.    Recent Labs: 06/07/2022: ALT 42; BUN 19; Creatinine, Ser 0.91; Potassium 4.3; Sodium 142   Recent Lipid Panel    Component Value Date/Time   CHOL 104 06/07/2022 0900   TRIG 231 (H) 06/07/2022 0900   HDL 22 (L) 06/07/2022 0900   CHOLHDL 4.7 06/07/2022 0900   CHOLHDL 5 04/21/2020 0843   VLDL 74.8 (H) 04/21/2020 0843   LDLCALC 45 06/07/2022 0900   LDLDIRECT 44.0 04/21/2020 0843    Additional studies/ records that were reviewed today include:   Echo 07/13/18: IMPRESSIONS      1. The left ventricle has normal systolic function with an ejection fraction of 60-65%. The cavity size was normal. Left ventricular diastolic Doppler parameters are consistent with impaired relaxation. No evidence of left ventricular regional wall  motion abnormalities.  2. The right ventricle has normal systolic function. The cavity was normal. There is no increase in right ventricular wall thickness.    Cardiac cath/PCI 07/13/18: CORONARY BALLOON ANGIOPLASTY  CORONARY STENT INTERVENTION  LEFT HEART CATH AND CORONARY ANGIOGRAPHY  Conclusion    Prox RCA to Mid RCA lesion is 90% stenosed. Mid RCA lesion is 60% stenosed. Previously placed Ost Cx to Mid Cx stent (unknown type) is widely patent. Prox LAD lesion is 50% stenosed. Ost 1st Diag to 1st Diag lesion is 50% stenosed. Post intervention, there is a 25% residual stenosis. A stent was successfully placed. Post intervention, there is a 25% residual stenosis. The left ventricular systolic function is normal. LV end diastolic pressure is normal. The left ventricular ejection fraction is 50-55% by visual estimate.   PEACE JOST is a 73 y.o. male      [id number] LOCATION:  FACILITY: [facility]   PHYSICIAN: Nanetta Batty,  M.D. Jun 26, 1950     DATE OF PROCEDURE:  07/13/2018   DATE OF DISCHARGE:        CARDIAC CATHETERIZATION / PCI DES RCA       History obtained from chart review.CHANCELER PULLIN is a 73 y.o. male with a hx of CAD s/p PCI to RCA; staged PCI to P LCx 04/2015, bradycardia, DM2 (diet controlled) and DJD who presented to [facility] on 07/12/2018 with chest pain and SOB.     PROCEDURE DESCRIPTION:    The patient was brought to the second floor [place] [facility]iac cath lab in the postabsorptive state. He was not premedicated . His right wrist was prepped and shaved in usual sterile fashion. Xylocaine 1% was used  for local anesthesia. A 6 French sheath was inserted into the right radial artery using standard Seldinger technique. The patient received 3500 units  of heparin intravenously.  A 5 [place] TIG catheter and pigtail catheters were used for selective coronary angiography and left ventriculography respectively.  Isovue dye was used for the entirety of the case.  Retrograde aorta, ventricular and pullback pressures were recorded.  Radial cocktail was administered via the SideArm sheath.   The patient received an additional 6500 units of heparin followed by 2000 units of heparin with an ACT in the high 200 range.  The patient received 600 mg of p.o. Plavix in addition to 20 mg of IV Pepcid.  Using a 6 [place] AL 0.75 guide catheter along with a whisper wire I was able to cross the proximal band and lesion with some difficulty ultimately placing the wire in the distal PLA branch.  I then predilated the proximal lesion with a 2 oh balloon but was unable to pass a stent because of tortuosity.  I then placed a guide liner just proximal to the lesion and was able to pass a three 5 x 12 mm Synergy drug-eluting stent across the proximal in-stent restenosis deployed at 18 atm.  I then used a 04/20/2010 balloon to dilate the mid 50 to 60% lesion as well as post dilate the stent.  There was still  mild "dog boning" within the Synergy stent in the proximal portion with residual 25% stenosis.  The guidewire and guide liner were removed and completion angiography was performed.  The guide catheter was removed from the body, the sheath was removed and a TR band was placed on the right wrist to achieve patent hemostasis.  The patient left lab in stable condition.     IMPRESSION: Successful balloon angioplasty of mid dominant RCA in-stent restenosis and restenting of the proximal RCA for high-grade in-stent restenosis with acceptable results.  The previously placed circumflex stent was widely open and the LAD had at most moderate disease unchanged from his prior angiogram 3 years ago.  His LV function was normal with a low LVEDP.  He will need dual antiplatelet platelet therapy uninterrupted for at least 12 months.   Nanetta Batty. MD, [facility] 07/13/2018 10:57 AM        CORONARY BALLOON ANGIOPLASTY  LEFT HEART CATH AND CORONARY ANGIOGRAPHY   Conclusion    Prox LAD lesion is 50% stenosed. Ost 1st Diag to 1st Diag lesion is 50% stenosed. Non-stenotic Ost Cx to Mid Cx lesion was previously treated. Mid RCA lesion is 35% stenosed. Prox RCA lesion is 90% stenosed. Post intervention, there is a 50% residual stenosis. Balloon angioplasty was performed using a BALLOON  EMERGE MR 4.5X8. The left ventricular systolic function is normal. LV end  diastolic pressure is normal. The left ventricular ejection fraction is 55-65% by visual estimate.   1. Single vessel obstructive CAD with in stent restenosis focal in the proximal RCA. This lesion has been stented twice before with stent underexpansion. 2. Normal LV function 3. Normal LVEDP 4. Partially successful PCI of the proximal RCA with Shock wave therapy and aggressive expansion with an oversized 4.5 mm [place] balloon. Lesion reduced to 50% but stents still not fully expanded.   Plan: same day DC. Continue DAPT indefinitely. RCA lesion is at high  risk for restenosis. If he has refractory angina despite medical therapy in the future may need to be considered for CABG.      ASSESSMENT:     No diagnosis found.     PLAN:     In order of problems listed above:  1. CAD - s/p inf STEMI in March 2017 tx with DES to RCA.  Staged PCI of the proximal to mid LCx with DES.  Recurrent symptoms in May 2020. S/p repeat PCI of the RCA with DES.  Continue ASA and Plavix indefinitely given multiple stents.  Continue  statin.   No beta blocker due to history of bradycardia.  S/p PCI of the proximal RCA with shock wave therapy and oversized [place] balloon. Residual 50% stenosis. Now with class 1 angina. Continue medical therapy   2. HTN - BP is elevated today but has been well controlled on other visits this past year. I asked him to monitor at home and let me know if it stays elevated.   3. HL - Continue statin.  LDL 45 at goal. Triglycerides elevated. Continue Stain and lovaza. Encourage dietary modification and weight control.   Follow up in 6 months  Medication Adjustments/Labs and Tests Ordered: Current medicines are reviewed at length with the patient today.  Concerns regarding medicines are outlined above.  Medication changes, Labs and Tests ordered today are outlined in the Patient Instructions noted below. There are no Patient Instructions on file for this visit.     Signed, Allyssia Skluzacek [place], MD  05/22/2023 10:30 AM    [facility]

## 2023-05-27 ENCOUNTER — Encounter: Payer: Self-pay | Admitting: Cardiology

## 2023-05-27 ENCOUNTER — Ambulatory Visit: Payer: Medicare Other | Attending: Cardiology | Admitting: Cardiology

## 2023-05-27 VITALS — BP 144/74 | HR 63 | Ht 65.0 in | Wt 170.0 lb

## 2023-05-27 DIAGNOSIS — I25118 Atherosclerotic heart disease of native coronary artery with other forms of angina pectoris: Secondary | ICD-10-CM | POA: Diagnosis not present

## 2023-05-27 DIAGNOSIS — I2510 Atherosclerotic heart disease of native coronary artery without angina pectoris: Secondary | ICD-10-CM | POA: Insufficient documentation

## 2023-05-27 DIAGNOSIS — I10 Essential (primary) hypertension: Secondary | ICD-10-CM | POA: Insufficient documentation

## 2023-05-27 DIAGNOSIS — E785 Hyperlipidemia, unspecified: Secondary | ICD-10-CM | POA: Insufficient documentation

## 2023-05-27 NOTE — Patient Instructions (Signed)
 Medication Instructions:  Continue all medications *If you need a refill on your cardiac medications before your next appointment, please call your pharmacy*  Lab Work: Bmet,lipid and hepatic panels,cbc today  Testing/Procedures: None ordered  Follow-Up: At [facility], you and your health needs are our priority.  As part of our continuing mission to provide you with exceptional heart care, our providers are all part of one team.  This team includes your primary Cardiologist (physician) and Advanced Practice Providers or APPs (Physician Assistants and Nurse Practitioners) who all work together to provide you with the care you need, when you need it.  Your next appointment: 6 months      Call in July to schedule Oct appointment     Provider:  Dr.Jordan   We recommend signing up for the patient portal called "MyChart".  Sign up information is provided on this After Visit Summary.  MyChart is used to connect with patients for Virtual Visits (Telemedicine).  Patients are able to view lab/test results, encounter notes, upcoming appointments, etc.  Non-urgent messages can be sent to your provider as well.   To learn more about what you can do with MyChart, go to [email or web address].         1st Floor: - Lobby - Registration  - Pharmacy  - Lab - Cafe  2nd Floor: - PV Lab - Diagnostic Testing (echo, CT, nuclear med)  3rd Floor: - Vacant  4th Floor: - TCTS (cardiothoracic surgery) - AFib Clinic - Structural Heart Clinic - Vascular Surgery  - Vascular Ultrasound  5th Floor: - HeartCare Cardiology (general and EP) - Clinical Pharmacy for coumadin, hypertension, lipid, weight-loss medications, and med management appointments    Valet parking services will be available as well.

## 2023-05-28 LAB — CBC WITH DIFFERENTIAL/PLATELET
Basophils Absolute: 0 10*3/uL (ref 0.0–0.2)
Basos: 0 %
EOS (ABSOLUTE): 0.1 10*3/uL (ref 0.0–0.4)
Eos: 1 %
Hematocrit: 42.2 % (ref 37.5–51.0)
Hemoglobin: 14.1 g/dL (ref 13.0–17.7)
Immature Grans (Abs): 0 10*3/uL (ref 0.0–0.1)
Immature Granulocytes: 1 %
Lymphocytes Absolute: 2.3 10*3/uL (ref 0.7–3.1)
Lymphs: 35 %
MCH: 31.1 pg (ref 26.6–33.0)
MCHC: 33.4 g/dL (ref 31.5–35.7)
MCV: 93 fL (ref 79–97)
Monocytes Absolute: 0.8 10*3/uL (ref 0.1–0.9)
Monocytes: 13 %
Neutrophils Absolute: 3.4 10*3/uL (ref 1.4–7.0)
Neutrophils: 50 %
Platelets: 233 10*3/uL (ref 150–450)
RBC: 4.54 x10E6/uL (ref 4.14–5.80)
RDW: 13.2 % (ref 11.6–15.4)
WBC: 6.7 10*3/uL (ref 3.4–10.8)

## 2023-05-28 LAB — HEPATIC FUNCTION PANEL
ALT: 50 IU/L — ABNORMAL HIGH (ref 0–44)
AST: 39 IU/L (ref 0–40)
Albumin: 4.3 g/dL (ref 3.8–4.8)
Alkaline Phosphatase: 41 IU/L — ABNORMAL LOW (ref 44–121)
Bilirubin Total: 0.7 mg/dL (ref 0.0–1.2)
Bilirubin, Direct: 0.23 mg/dL (ref 0.00–0.40)
Total Protein: 7.3 g/dL (ref 6.0–8.5)

## 2023-05-28 LAB — LIPID PANEL
Chol/HDL Ratio: 5.1 ratio — ABNORMAL HIGH (ref 0.0–5.0)
Cholesterol, Total: 107 mg/dL (ref 100–199)
HDL: 21 mg/dL — ABNORMAL LOW (ref >39)
LDL Chol Calc (NIH): 53 mg/dL (ref 0–99)
Triglycerides: 200 mg/dL — ABNORMAL HIGH (ref 0–149)
VLDL Cholesterol Cal: 33 mg/dL (ref 5–40)

## 2023-05-28 LAB — BASIC METABOLIC PANEL WITH GFR
BUN/Creatinine Ratio: 13 (ref 10–24)
BUN: 12 mg/dL (ref 8–27)
CO2: 20 mmol/L (ref 20–29)
Calcium: 9.2 mg/dL (ref 8.6–10.2)
Chloride: 106 mmol/L (ref 96–106)
Creatinine, Ser: 0.95 mg/dL (ref 0.76–1.27)
Glucose: 111 mg/dL — ABNORMAL HIGH (ref 70–99)
Potassium: 4.5 mmol/L (ref 3.5–5.2)
Sodium: 143 mmol/L (ref 134–144)
eGFR: 83 mL/min/{1.73_m2} (ref >59)

## 2023-06-22 ENCOUNTER — Other Ambulatory Visit: Payer: Self-pay | Admitting: Cardiology

## 2023-08-04 ENCOUNTER — Other Ambulatory Visit: Payer: Self-pay | Admitting: Cardiology

## 2023-11-03 ENCOUNTER — Other Ambulatory Visit: Payer: Self-pay | Admitting: Cardiology

## 2023-11-16 ENCOUNTER — Ambulatory Visit

## 2023-11-16 ENCOUNTER — Ambulatory Visit: Admitting: Family Medicine

## 2023-11-16 VITALS — BP 150/84 | HR 80 | Temp 98.0°F | Ht 64.17 in | Wt 166.4 lb

## 2023-11-16 DIAGNOSIS — R7401 Elevation of levels of liver transaminase levels: Secondary | ICD-10-CM

## 2023-11-16 DIAGNOSIS — Z23 Encounter for immunization: Secondary | ICD-10-CM

## 2023-11-16 DIAGNOSIS — E785 Hyperlipidemia, unspecified: Secondary | ICD-10-CM | POA: Diagnosis not present

## 2023-11-16 DIAGNOSIS — R739 Hyperglycemia, unspecified: Secondary | ICD-10-CM

## 2023-11-16 DIAGNOSIS — I25110 Atherosclerotic heart disease of native coronary artery with unstable angina pectoris: Secondary | ICD-10-CM | POA: Diagnosis not present

## 2023-11-16 DIAGNOSIS — I10 Essential (primary) hypertension: Secondary | ICD-10-CM | POA: Diagnosis not present

## 2023-11-16 MED ORDER — HYDROCHLOROTHIAZIDE 12.5 MG PO CAPS: 12.5000 mg | ORAL_CAPSULE | Freq: Every day | ORAL | 3 refills | Status: AC

## 2023-11-16 NOTE — Progress Notes (Signed)
 Established Patient Office Visit  Subjective   Patient ID: Terry Rivers, male    DOB: 1950/07/19  Age: 73 y.o. MRN: [id number]  Chief Complaint  Patient presents with   Annual Exam    HPI   Terry Rivers is seen for annual medical follow-up.  He has history of CAD.  He had stent placed right coronary vessel 2017 following ST elevation MI.  Denies any recent chest pains.  He has hyperlipidemia and hypertension.  History of GERD controlled with Protonix  40 mg daily.  Other medications include lisinopril  40 mg daily, Plavix  75 mg daily, atorvastatin  80 mg daily, aspirin  81 mg daily, and amlodipine  5 mg daily  Had lipids through cardiology back in April with LDL cholesterol 53 and total cholesterol 107.  ALT was slightly elevated then at 50.  He has had multiple mildly elevated glucose readings in recent years.  No recent A1c.  Of note, his blood pressure readings have been fairly consistently elevated through recent visits with cardiology and get up today.  Initial reading here 150/84 and repeat left arm seated after rest 156/84.  He states he is compliant with medications.  He smoked only briefly in his teens but has used some oral tobacco off-and-on  Past Medical History:  Diagnosis Date   Arthritis    Benign essential HTN    Bradycardia    low 50s per wife   CAD, multiple vessel-native arteries, emergent PCI RCA with DES 04/15/15 2017   a. 03/2014- inferior STEMI with occluded RCA; b. 04/2015: Staged PCI of LCx c. 07/13/18 PCI to [facility] for ISR   Chicken pox    Diabetes mellitus without complication (HCC)    Borderline/no meds   Frequent headaches    GERD (gastroesophageal reflux disease)    Hyperlipidemia LDL goal <70 04/17/2015   Myocardial infarction (HCC) 03/2015   Tobacco use 04/17/2015   Unstable angina (HCC) 06/2018   Past Surgical History:  Procedure Laterality Date   CARDIAC CATHETERIZATION N/A 04/15/2015   Procedure: Left Heart Cath and Coronary Angiography;  Surgeon: Peter M  Swaziland, MD;  Location: [facility] INVASIVE CV LAB;  Service: Cardiovascular;  Laterality: N/A;   CARDIAC CATHETERIZATION N/A 04/15/2015   Procedure: Coronary Stent Intervention;  Surgeon: Peter M Swaziland, MD;  Location: [facility] INVASIVE CV LAB;  Service: Cardiovascular;  Laterality: N/A;   CARDIAC CATHETERIZATION N/A 05/06/2015   Procedure: Coronary Stent Intervention;  Surgeon: Peter M Swaziland, MD;  Location: [facility] INVASIVE CV LAB;  Service: Cardiovascular;  Laterality: N/A;   CARDIAC CATHETERIZATION  05/06/2015   Procedure: Coronary/Graft Angiography;  Surgeon: Peter M Swaziland, MD;  Location: [facility] INVASIVE CV LAB;  Service: Cardiovascular;;   COLONOSCOPY     CORONARY BALLOON ANGIOPLASTY N/A 07/13/2018   Procedure: CORONARY BALLOON ANGIOPLASTY;  Surgeon: Court Dorn PARAS, MD;  Location: [facility] INVASIVE CV LAB;  Service: Cardiovascular;  Laterality: N/A;   CORONARY BALLOON ANGIOPLASTY N/A 08/15/2020   Procedure: CORONARY BALLOON ANGIOPLASTY;  Surgeon: Swaziland, Peter M, MD;  Location: [facility] INVASIVE CV LAB;  Service: Cardiovascular;  Laterality: N/A;   CORONARY STENT INTERVENTION N/A 07/13/2018   Procedure: CORONARY STENT INTERVENTION;  Surgeon: Court Dorn PARAS, MD;  Location: [facility] INVASIVE CV LAB;  Service: Cardiovascular;  Laterality: N/A;   CORONARY STENT PLACEMENT  05/06/2015   LT CIRC DES X2   LEFT HEART CATH AND CORONARY ANGIOGRAPHY N/A 07/13/2018   Procedure: LEFT HEART CATH AND CORONARY ANGIOGRAPHY;  Surgeon: Court Dorn PARAS, MD;  Location: [facility] INVASIVE CV LAB;  Service: Cardiovascular;  Laterality: N/A;   LEFT HEART CATH AND CORONARY ANGIOGRAPHY N/A 08/15/2020   Procedure: LEFT HEART CATH AND CORONARY ANGIOGRAPHY;  Surgeon: Swaziland, Peter M, MD;  Location: [facility] INVASIVE CV LAB;  Service: Cardiovascular;  Laterality: N/A;   POLYPECTOMY     SKIN CANCER EXCISION     right forearm/pre-cancerous/ 2 spots on left arm    reports that he has never smoked. His smokeless tobacco use includes chew. He reports that he does not drink  alcohol and does not use drugs. family history includes Brain cancer in his sister; Cancer in his brother and father; Colon cancer in his brother and father; Kidney cancer in his brother, sister, sister, sister, and sister; Stroke in his mother. Allergies  Allergen Reactions   Penicillins Anaphylaxis, Shortness Of Breath and Swelling    Joints became swollen /turned purple Did it involve swelling of the face/tongue/throat, SOB, or low BP? No Did it involve sudden or severe rash/hives, skin peeling, or any reaction on the inside of your mouth or nose? No Did you need to seek medical attention at a hospital or doctor's office? Yes When did it last happen? Many years ago    If all above answers are NO, may proceed with cephalosporin use.     Prednisone  Swelling and Other (See Comments)    Made the patient's joints swell to the point where he could not walk    Review of Systems  Constitutional:  Negative for malaise/fatigue.  Eyes:  Negative for blurred vision.  Respiratory:  Negative for shortness of breath.   Cardiovascular:  Negative for chest pain.  Neurological:  Negative for dizziness, weakness and headaches.      Objective:     BP (!) 150/84   Pulse 80   Temp 98 F (36.7 C) (Oral)   Ht 5' 4.17 (1.63 m)   Wt 166 lb 6.4 oz (75.5 kg)   SpO2 95%   BMI 28.41 kg/m  BP Readings from Last 3 Encounters:  11/16/23 (!) 150/84  05/27/23 (!) 144/74  11/19/22 (!) 156/80   Wt Readings from Last 3 Encounters:  11/16/23 166 lb 6.4 oz (75.5 kg)  05/27/23 170 lb (77.1 kg)  11/19/22 164 lb (74.4 kg)      Physical Exam Vitals reviewed.  Constitutional:      General: He is not in acute distress.    Appearance: He is well-developed. He is not ill-appearing.  Eyes:     Pupils: Pupils are equal, round, and reactive to light.  Neck:     Thyroid : No thyromegaly.  Cardiovascular:     Rate and Rhythm: Normal rate and regular rhythm.  Pulmonary:     Effort: Pulmonary effort is  normal. No respiratory distress.     Breath sounds: Normal breath sounds. No wheezing or rales.  Musculoskeletal:     Cervical back: Neck supple.     Right lower leg: No edema.     Left lower leg: No edema.  Neurological:     Mental Status: He is alert and oriented to person, place, and time.      No results found for any visits on 11/16/23.  Last CBC Lab Results  Component Value Date   WBC 6.7 05/27/2023   HGB 14.1 05/27/2023   HCT 42.2 05/27/2023   MCV 93 05/27/2023   MCH 31.1 05/27/2023   RDW 13.2 05/27/2023   PLT 233 05/27/2023   Last metabolic panel Lab Results  Component Value Date  GLUCOSE 111 (H) 05/27/2023   NA 143 05/27/2023   K 4.5 05/27/2023   CL 106 05/27/2023   CO2 20 05/27/2023   BUN 12 05/27/2023   CREATININE 0.95 05/27/2023   EGFR 83 05/27/2023   CALCIUM  9.2 05/27/2023   PROT 7.3 05/27/2023   ALBUMIN 4.3 05/27/2023   LABGLOB 2.7 10/13/2017   AGRATIO 1.6 10/13/2017   BILITOT 0.7 05/27/2023   ALKPHOS 41 (L) 05/27/2023   AST 39 05/27/2023   ALT 50 (H) 05/27/2023   ANIONGAP 8 07/14/2018   Last lipids Lab Results  Component Value Date   CHOL 107 05/27/2023   HDL 21 (L) 05/27/2023   LDLCALC 53 05/27/2023   LDLDIRECT 44.0 04/21/2020   TRIG 200 (H) 05/27/2023   CHOLHDL 5.1 (H) 05/27/2023   Last hemoglobin A1c Lab Results  Component Value Date   HGBA1C 5.8 (H) 04/16/2015      The ASCVD Risk score (Arnett DK, et al., 2019) failed to calculate for the following reasons:   Risk score cannot be calculated because patient has a medical history suggesting prior/existing ASCVD    Assessment & Plan:   #1 history of CAD/hyperlipidemia.  Recent LDL cholesterol 53.  Patient on high-dose statin and also remains on aspirin .  No recent chest pains.  Continue close follow-up with cardiology  #2 hypertension.  Currently treated with lisinopril  40 mg daily and amlodipine  5 mg daily.  Multiple suboptimal blood pressure readings including today.  Add HCTZ  12.5 mg once daily.  Set up 1 month follow-up and recheck blood pressure then along with basic metabolic panel.  Reminder to keep sodium intake less than 2500 mg if possible  #3 history of mild hyperglycemia.  Reassess A1c today.  Patient states he is eating about 6 to 8 pieces of loaf bread per day and also frequently eats desserts.  We recommend try to scale back those high glycemic foods.  #4 health maintenance.  Influenza vaccine given.  Overdue for repeat colonoscopy but he is not sure he wishes to go at this time.  He will let us  know if he changes mind.  Also recommend he consider Shingrix vaccine.  He will consider.  #5 mild elevated liver transaminase with ALT of 50 by recent labs in April.  Recheck hepatic panel to assess stability   Return in about 1 month (around 12/17/2023).    Wolm Scarlet, MD

## 2023-11-16 NOTE — Patient Instructions (Addendum)
 Consider repeat colonoscopy this year  Consider Shingles vaccine at some point this year- can get at local pharmacy  Start the hydrochlorothiazide 12.5 mg once daily (in addition to your other BP medications)  Set up one month follow up.SABRA

## 2023-11-17 ENCOUNTER — Ambulatory Visit: Payer: Self-pay | Admitting: Family Medicine

## 2023-11-17 LAB — HEPATIC FUNCTION PANEL
ALT: 46 U/L (ref 0–53)
AST: 35 U/L (ref 0–37)
Albumin: 4.7 g/dL (ref 3.5–5.2)
Alkaline Phosphatase: 31 U/L — ABNORMAL LOW (ref 39–117)
Bilirubin, Direct: 0.2 mg/dL (ref 0.0–0.3)
Total Bilirubin: 0.8 mg/dL (ref 0.2–1.2)
Total Protein: 8.1 g/dL (ref 6.0–8.3)

## 2023-11-17 LAB — HEMOGLOBIN A1C: Hgb A1c MFr Bld: 6.1 % (ref 4.6–6.5)

## 2023-11-30 NOTE — Progress Notes (Signed)
 Cardiology Office Note:    Date:  12/05/2023   ID:  Terry Rivers, DOB 03/31/1950, MRN [id number]  PCP:  Micheal Wolm ORN, MD  Cardiologist:  Dr. Wille Aubuchon [place]    Chief Complaint  Patient presents with   Coronary Artery Disease    History of Present Illness:     Terry Rivers is a 73 y.o. male with a hx of CAD, bradycardia, DM (diet controlled), DJD who is seen for follow up CAD  Admitted 2/28-04/17/15 with inferior STEMI.  LHC demonstrated multivessel CAD with occluded RCA as the culprit. RCA was treated with DES.  He has residual LAD/Dx and LCx disease. He was not started on beta-blocker due to bradycardia. On 05/06/15 he underwent staged PCI of the proximal to mid LCx with a DES. The mid LAD had a 60% stenosis with FFR of 0.9 and was  treated medically.  Has done very well until March 2020 when he began experiencing intermittent exertional chest pain and dyspnea.  He was admitted in May and had repeat cardiac cath showing restenosis in the RCA treated with DES. Stents in the  LCx still patent.  LAD disease unchanged. For BP control his lisinopril  dose was increased and amlodipine  added.   He was seen on 08/05/20 with  increasing episodes of angina.   On 08/15/2020 underwent cardiac catheterization showing single-vessel obstructive CAD with in-stent restenosis focal in the proximal RCA.  Of note lesion has been stented twice before with  stent underexpansion.  He underwent partially successful PCI of proximal RCA with Shock wave therapy and aggressive expansion with oversized 4.5 [place] balloon with lesion reduced to 50% but stent still not fully expanded.  He was recommended for continued indefinite DAPT.  RCA lesion noted to be high risk for restenosis and if he has refractory angina despite medical therapy in the future may need to be considered for CABG.   Patient states he is doing well. Has been walking, mowing and gardening.  Denies any significant chest pain or dyspnea.  BP was elevated when  he saw Dr Micheal and was placed on HCT. Since then BP improved. 133/71 at home.  Tolerating medication well.     Past Medical History:  Diagnosis Date   Arthritis    Benign essential HTN    Bradycardia    low 50s per wife   CAD, multiple vessel-native arteries, emergent PCI RCA with DES 04/15/15 2017   a. 03/2014- inferior STEMI with occluded RCA; b. 04/2015: Staged PCI of LCx c. 07/13/18 PCI to [facility] for ISR   Chicken pox    Diabetes mellitus without complication (HCC)    Borderline/no meds   Frequent headaches    GERD (gastroesophageal reflux disease)    Hyperlipidemia LDL goal <70 04/17/2015   Myocardial infarction (HCC) 03/2015   Tobacco use 04/17/2015   Unstable angina (HCC) 06/2018    Past Surgical History:  Procedure Laterality Date   CARDIAC CATHETERIZATION N/A 04/15/2015   Procedure: Left Heart Cath and Coronary Angiography;  Surgeon: Abhijay Morriss M [place], MD;  Location: [facility] INVASIVE CV LAB;  Service: Cardiovascular;  Laterality: N/A;   CARDIAC CATHETERIZATION N/A 04/15/2015   Procedure: Coronary Stent Intervention;  Surgeon: Yahya Boldman M [place], MD;  Location: [facility] INVASIVE CV LAB;  Service: Cardiovascular;  Laterality: N/A;   CARDIAC CATHETERIZATION N/A 05/06/2015   Procedure: Coronary Stent Intervention;  Surgeon: Santonio Speakman M [place], MD;  Location: [facility] INVASIVE CV LAB;  Service: Cardiovascular;  Laterality: N/A;   CARDIAC CATHETERIZATION  05/06/2015   Procedure: Coronary/Graft Angiography;  Surgeon: Brynley Cuddeback M [place], MD;  Location: [facility] INVASIVE CV LAB;  Service: Cardiovascular;;   COLONOSCOPY     CORONARY BALLOON ANGIOPLASTY N/A 07/13/2018   Procedure: CORONARY BALLOON ANGIOPLASTY;  Surgeon: Court Dorn PARAS, MD;  Location: [facility] INVASIVE CV LAB;  Service: Cardiovascular;  Laterality: N/A;   CORONARY BALLOON ANGIOPLASTY N/A 08/15/2020   Procedure: CORONARY BALLOON ANGIOPLASTY;  Surgeon: [place], Chavez Rosol M, MD;  Location: [facility] INVASIVE CV LAB;  Service: Cardiovascular;  Laterality: N/A;   CORONARY STENT  INTERVENTION N/A 07/13/2018   Procedure: CORONARY STENT INTERVENTION;  Surgeon: Court Dorn PARAS, MD;  Location: [facility] INVASIVE CV LAB;  Service: Cardiovascular;  Laterality: N/A;   CORONARY STENT PLACEMENT  05/06/2015   LT CIRC DES X2   LEFT HEART CATH AND CORONARY ANGIOGRAPHY N/A 07/13/2018   Procedure: LEFT HEART CATH AND CORONARY ANGIOGRAPHY;  Surgeon: Court Dorn PARAS, MD;  Location: [facility] INVASIVE CV LAB;  Service: Cardiovascular;  Laterality: N/A;   LEFT HEART CATH AND CORONARY ANGIOGRAPHY N/A 08/15/2020   Procedure: LEFT HEART CATH AND CORONARY ANGIOGRAPHY;  Surgeon: [place], Carter Kassel M, MD;  Location: [facility] INVASIVE CV LAB;  Service: Cardiovascular;  Laterality: N/A;   POLYPECTOMY     SKIN CANCER EXCISION     right forearm/pre-cancerous/ 2 spots on left arm    Current Medications: Outpatient Medications Prior to Visit  Medication Sig Dispense Refill   acetaminophen  (TYLENOL ) 500 MG tablet Take 500-1,000 mg by mouth every 6 (six) hours as needed for mild pain or headache.     amLODipine  (NORVASC ) 5 MG tablet TAKE 1 TABLET (5 MG TOTAL) BY MOUTH DAILY. 90 tablet 3   aspirin  81 MG chewable tablet Chew 1 tablet (81 mg total) by mouth daily.     atorvastatin  (LIPITOR ) 80 MG tablet TAKE 1 TABLET BY MOUTH DAILY AT 6 PM. 90 tablet 2   celecoxib  (CELEBREX ) 200 MG capsule TAKE 1 CAPSULE BY MOUTH EVERY DAY 30 capsule 0   clopidogrel  (PLAVIX ) 75 MG tablet TAKE 1 TABLET BY MOUTH EVERY DAY WITH BREAKFAST 90 tablet 1   hydrochlorothiazide (MICROZIDE) 12.5 MG capsule Take 1 capsule (12.5 mg total) by mouth daily. 90 capsule 3   lisinopril  (ZESTRIL ) 40 MG tablet TAKE 1 TABLET BY MOUTH EVERY DAY 90 tablet 2   nitroGLYCERIN  (NITROSTAT ) 0.4 MG SL tablet Place 1 tablet (0.4 mg total) under the tongue every 5 (five) minutes x 3 doses as needed for chest pain. 25 tablet 11   omega-3 acid ethyl esters (LOVAZA ) 1 g capsule TAKE 2 CAPSULES BY MOUTH TWICE A DAY 360 capsule 3   pantoprazole  (PROTONIX ) 40 MG tablet TAKE 1  TABLET BY MOUTH EVERY DAY BEFORE BREAKFAST 90 tablet 1   No facility-administered medications prior to visit.     Allergies:   Penicillins and Prednisone    Social History   Socioeconomic History   Marital status: Married    Spouse name: Not on file   Number of children: Not on file   Years of education: Not on file   Highest education level: Not on file  Occupational History   Not on file  Tobacco Use   Smoking status: Never   Smokeless tobacco: Current    Types: Chew   Tobacco comments:    chew every day  Vaping Use   Vaping status: Never Used  Substance and Sexual Activity   Alcohol use: No    Alcohol/week: 0.0 standard drinks of alcohol   Drug use:  No   Sexual activity: Not on file  Other Topics Concern   Not on file  Social History Narrative   Not on file   Social Drivers of Health   Financial Resource Strain: Not on file  Food Insecurity: Not on file  Transportation Needs: Not on file  Physical Activity: Not on file  Stress: Not on file  Social Connections: Not on file     Family History:  The patient's family history includes Brain cancer in his sister; Cancer in his brother and father; Colon cancer in his brother and father; Kidney cancer in his brother, sister, sister, sister, and sister; Stroke in his mother.   ROS:   Please see the history of present illness.    ROSAll other systems reviewed and are negative.   Physical Exam:    VS:  BP 136/71 (BP Location: Left Arm, Patient Position: Sitting, Cuff Size: Normal)   Pulse 81   Ht 5' 5 (1.651 m)   Wt 159 lb 6.4 oz (72.3 kg)   SpO2 94%   BMI 26.53 kg/m    GENERAL:  Well appearing WM in NAD HEENT:  PERRL, EOMI, sclera are clear. Oropharynx is clear. NECK:  No jugular venous distention, carotid upstroke brisk and symmetric, no bruits, no thyromegaly or adenopathy LUNGS:  Clear to auscultation bilaterally CHEST:  Unremarkable HEART:  RRR,  PMI not displaced or sustained,S1 and S2 within normal  limits, no S3, no S4: no clicks, no rubs, no murmurs ABD:  Soft, nontender. BS +, no masses or bruits. No hepatomegaly, no splenomegaly EXT:  2 + pulses throughout, no edema, no cyanosis no clubbing SKIN:  Warm and dry.  No rashes NEURO:  Alert and oriented x 3. Cranial nerves II through XII intact. PSYCH:  Cognitively intact      Wt Readings from Last 3 Encounters:  12/05/23 159 lb 6.4 oz (72.3 kg)  11/16/23 166 lb 6.4 oz (75.5 kg)  05/27/23 170 lb (77.1 kg)      Studies/Labs Reviewed:         Recent Labs: 05/27/2023: BUN 12; Creatinine, Ser 0.95; Hemoglobin 14.1; Platelets 233; Potassium 4.5; Sodium 143 11/16/2023: ALT 46   Recent Lipid Panel    Component Value Date/Time   CHOL 107 05/27/2023 0851   TRIG 200 (H) 05/27/2023 0851   HDL 21 (L) 05/27/2023 0851   CHOLHDL 5.1 (H) 05/27/2023 0851   CHOLHDL 5 04/21/2020 0843   VLDL 74.8 (H) 04/21/2020 0843   LDLCALC 53 05/27/2023 0851   LDLDIRECT 44.0 04/21/2020 0843    Additional studies/ records that were reviewed today include:   Echo 07/13/18: IMPRESSIONS      1. The left ventricle has normal systolic function with an ejection fraction of 60-65%. The cavity size was normal. Left ventricular diastolic Doppler parameters are consistent with impaired relaxation. No evidence of left ventricular regional wall  motion abnormalities.  2. The right ventricle has normal systolic function. The cavity was normal. There is no increase in right ventricular wall thickness.    Cardiac cath/PCI 07/13/18: CORONARY BALLOON ANGIOPLASTY  CORONARY STENT INTERVENTION  LEFT HEART CATH AND CORONARY ANGIOGRAPHY  Conclusion    Prox RCA to Mid RCA lesion is 90% stenosed. Mid RCA lesion is 60% stenosed. Previously placed Ost Cx to Mid Cx stent (unknown type) is widely patent. Prox LAD lesion is 50% stenosed. Ost 1st Diag to 1st Diag lesion is 50% stenosed. Post intervention, there is a 25% residual stenosis. A stent was successfully  placed. Post intervention, there is a 25% residual stenosis. The left ventricular systolic function is normal. LV end diastolic pressure is normal. The left ventricular ejection fraction is 50-55% by visual estimate.   Terry Rivers is a 73 y.o. male      [id number] LOCATION:  FACILITY: [facility]  PHYSICIAN: Dorn Lesches, M.D. 07/24/1950     DATE OF PROCEDURE:  07/13/2018   DATE OF DISCHARGE:        CARDIAC CATHETERIZATION / PCI DES RCA       History obtained from chart review.HARGIS VANDYNE is a 73 y.o. male with a hx of CAD s/p PCI to RCA; staged PCI to P LCx 04/2015, bradycardia, DM2 (diet controlled) and DJD who presented to [facility] on 07/12/2018 with chest pain and SOB.     PROCEDURE DESCRIPTION:    The patient was brought to the second floor  Cardiac cath lab in the postabsorptive state. He was not premedicated . His right wrist was prepped and shaved in usual sterile fashion. Xylocaine  1% was used  for local anesthesia. A 6 French sheath was inserted into the right radial artery using standard Seldinger technique. The patient received 3500 units  of heparin  intravenously.  A 5 [place] TIG catheter and pigtail catheters were used for selective coronary angiography and left ventriculography respectively.  Isovue  dye was used for the entirety of the case.  Retrograde aorta, ventricular and pullback pressures were recorded.  Radial cocktail was administered via the SideArm sheath.   The patient received an additional 6500 units of heparin  followed by 2000 units of heparin  with an ACT in the high 200 range.  The patient received 600 mg of p.o. Plavix  in addition to 20 mg of IV Pepcid .  Using a 6 [place] AL 0.75 guide catheter along with a whisper wire I was able to cross the proximal band and lesion with some difficulty ultimately placing the wire in the distal PLA branch.  I then predilated the proximal lesion with a 2 oh balloon but was unable to pass a stent because of tortuosity.   I then placed a guide liner just proximal to the lesion and was able to pass a three 5 x 12 mm Synergy drug-eluting stent across the proximal in-stent restenosis deployed at 18 atm.  I then used a 04/20/2010 balloon to dilate the mid 50 to 60% lesion as well as post dilate the stent.  There was still mild dog boning within the Synergy stent in the proximal portion with residual 25% stenosis.  The guidewire and guide liner were removed and completion angiography was performed.  The guide catheter was removed from the body, the sheath was removed and a TR band was placed on the right wrist to achieve patent hemostasis.  The patient left lab in stable condition.     IMPRESSION: Successful balloon angioplasty of mid dominant RCA in-stent restenosis and restenting of the proximal RCA for high-grade in-stent restenosis with acceptable results.  The previously placed circumflex stent was widely open and the LAD had at most moderate disease unchanged from his prior angiogram 3 years ago.  His LV function was normal with a low LVEDP.  He will need dual antiplatelet platelet therapy uninterrupted for at least 12 months.   Dorn Lesches. MD, [facility] 07/13/2018 10:57 AM        CORONARY BALLOON ANGIOPLASTY  LEFT HEART CATH AND CORONARY ANGIOGRAPHY   Conclusion    Prox LAD lesion is 50% stenosed. Ost 1st Diag to  1st Diag lesion is 50% stenosed. Non-stenotic Ost Cx to Mid Cx lesion was previously treated. Mid RCA lesion is 35% stenosed. Prox RCA lesion is 90% stenosed. Post intervention, there is a 50% residual stenosis. Balloon angioplasty was performed using a BALLOON [place] EMERGE MR [id number]. The left ventricular systolic function is normal. LV end diastolic pressure is normal. The left ventricular ejection fraction is 55-65% by visual estimate.   1. Single vessel obstructive CAD with in stent restenosis focal in the proximal RCA. This lesion has been stented twice before with stent underexpansion. 2.  Normal LV function 3. Normal LVEDP 4. Partially successful PCI of the proximal RCA with Shock wave therapy and aggressive expansion with an oversized 4.5 mm [place] balloon. Lesion reduced to 50% but stents still not fully expanded.   Plan: same day DC. Continue DAPT indefinitely. RCA lesion is at high risk for restenosis. If he has refractory angina despite medical therapy in the future may need to be considered for CABG.      ASSESSMENT:     1. Coronary artery disease, unspecified vessel or lesion type, unspecified whether angina present, unspecified whether native or transplanted heart   2. Hyperlipidemia LDL goal <70   3. Essential hypertension         PLAN:     In order of problems listed above:  1. CAD - s/p inf STEMI in March 2017 tx with DES to RCA.  Staged PCI of the proximal to mid LCx with DES.  Recurrent symptoms in May 2020. S/p repeat PCI of the RCA with DES.  Continue ASA and Plavix  indefinitely given multiple stents.  Continue  statin.    S/p PCI of the proximal RCA with shock wave therapy and oversized [place] balloon in 2022. Residual 50% stenosis. Now with class 1 angina- unchanged. Not a candidate for beta blocker due to HR. Continue current therapy   2. HTN - BP is now well controlled with addition of HCT. Continue amlodipine  and lisinopril .   3. HL - Continue statin.  LDL 53 at goal.  Continue Stain and lovaza . Encourage dietary modification and weight control. Will check labs today  Follow up in 6 months  Medication Adjustments/Labs and Tests Ordered: Current medicines are reviewed at length with the patient today.  Concerns regarding medicines are outlined above.  Medication changes, Labs and Tests ordered today are outlined in the Patient Instructions noted below. There are no Patient Instructions on file for this visit.     Signed, Conlee Sliter [place], MD  12/05/2023 8:18 AM    [facility]

## 2023-12-01 ENCOUNTER — Other Ambulatory Visit: Payer: Self-pay | Admitting: Cardiology

## 2023-12-05 ENCOUNTER — Ambulatory Visit: Attending: Cardiology | Admitting: Cardiology

## 2023-12-05 ENCOUNTER — Encounter: Payer: Self-pay | Admitting: Cardiology

## 2023-12-05 VITALS — BP 136/71 | HR 81 | Ht 65.0 in | Wt 159.4 lb

## 2023-12-05 DIAGNOSIS — I10 Essential (primary) hypertension: Secondary | ICD-10-CM | POA: Insufficient documentation

## 2023-12-05 DIAGNOSIS — E785 Hyperlipidemia, unspecified: Secondary | ICD-10-CM | POA: Insufficient documentation

## 2023-12-05 DIAGNOSIS — I2510 Atherosclerotic heart disease of native coronary artery without angina pectoris: Secondary | ICD-10-CM | POA: Insufficient documentation

## 2023-12-05 NOTE — Patient Instructions (Addendum)
 Medication Instructions:  Continue same medications *If you need a refill on your cardiac medications before your next appointment, please call your pharmacy*  Lab Work: None ordered  Testing/Procedures: None ordered  Follow-Up: At [facility], you and your health needs are our priority.  As part of our continuing mission to provide you with exceptional heart care, our providers are all part of one team.  This team includes your primary Cardiologist (physician) and Advanced Practice Providers or APPs (Physician Assistants and Nurse Practitioners) who all work together to provide you with the care you need, when you need it.  Your next appointment  6 months   Call in Jan to schedule April appointment     Provider:  Dr.Jordan   We recommend signing up for the patient portal called MyChart.  Sign up information is provided on this After Visit Summary.  MyChart is used to connect with patients for Virtual Visits (Telemedicine).  Patients are able to view lab/test results, encounter notes, upcoming appointments, etc.  Non-urgent messages can be sent to your provider as well.   To learn more about what you can do with MyChart, go to [email or web address].

## 2024-02-03 ENCOUNTER — Other Ambulatory Visit: Payer: Self-pay | Admitting: Cardiology

## 2024-05-23 ENCOUNTER — Ambulatory Visit: Admitting: Cardiology
